# Patient Record
Sex: Male | Born: 1950
Health system: Southern US, Community
[De-identification: ages and names within clinical notes are randomized; demographics above are authoritative.]

## PROBLEM LIST (undated history)

## (undated) DIAGNOSIS — E119 Type 2 diabetes mellitus without complications: Secondary | ICD-10-CM

## (undated) DIAGNOSIS — F329 Major depressive disorder, single episode, unspecified: Secondary | ICD-10-CM

## (undated) DIAGNOSIS — M549 Dorsalgia, unspecified: Secondary | ICD-10-CM

## (undated) DIAGNOSIS — N200 Calculus of kidney: Secondary | ICD-10-CM

## (undated) DIAGNOSIS — E079 Disorder of thyroid, unspecified: Secondary | ICD-10-CM

## (undated) DIAGNOSIS — E039 Hypothyroidism, unspecified: Secondary | ICD-10-CM

## (undated) DIAGNOSIS — F419 Anxiety disorder, unspecified: Secondary | ICD-10-CM

## (undated) DIAGNOSIS — F32A Depression, unspecified: Secondary | ICD-10-CM

## (undated) DIAGNOSIS — M1990 Unspecified osteoarthritis, unspecified site: Secondary | ICD-10-CM

## (undated) DIAGNOSIS — Z87442 Personal history of urinary calculi: Secondary | ICD-10-CM

## (undated) DIAGNOSIS — G8929 Other chronic pain: Secondary | ICD-10-CM

## (undated) DIAGNOSIS — G4730 Sleep apnea, unspecified: Secondary | ICD-10-CM

## (undated) HISTORY — PX: CERVICAL FUSION: SHX112

## (undated) HISTORY — PX: KNEE SURGERY: SHX244

## (undated) HISTORY — PX: BACK SURGERY: SHX140

---

## 1998-08-25 ENCOUNTER — Other Ambulatory Visit: Admission: RE | Admit: 1998-08-25 | Discharge: 1998-08-25 | Payer: Self-pay | Admitting: Urology

## 2004-03-20 ENCOUNTER — Encounter: Admission: RE | Admit: 2004-03-20 | Discharge: 2004-04-13 | Payer: Self-pay | Admitting: *Deleted

## 2005-08-25 ENCOUNTER — Encounter: Admission: RE | Admit: 2005-08-25 | Discharge: 2005-10-10 | Payer: Self-pay | Admitting: Family Medicine

## 2005-10-18 ENCOUNTER — Ambulatory Visit (HOSPITAL_COMMUNITY): Admission: RE | Admit: 2005-10-18 | Discharge: 2005-10-19 | Payer: Self-pay | Admitting: Neurosurgery

## 2006-01-26 ENCOUNTER — Encounter: Admission: RE | Admit: 2006-01-26 | Discharge: 2006-03-30 | Payer: Self-pay | Admitting: *Deleted

## 2006-03-17 ENCOUNTER — Ambulatory Visit (HOSPITAL_COMMUNITY): Admission: RE | Admit: 2006-03-17 | Discharge: 2006-03-17 | Payer: Self-pay | Admitting: Gastroenterology

## 2007-07-17 ENCOUNTER — Encounter: Admission: RE | Admit: 2007-07-17 | Discharge: 2007-07-17 | Payer: Self-pay | Admitting: *Deleted

## 2008-11-01 ENCOUNTER — Ambulatory Visit (HOSPITAL_COMMUNITY): Admission: RE | Admit: 2008-11-01 | Discharge: 2008-11-01 | Payer: Self-pay | Admitting: Otolaryngology

## 2009-01-28 ENCOUNTER — Ambulatory Visit (HOSPITAL_BASED_OUTPATIENT_CLINIC_OR_DEPARTMENT_OTHER): Admission: RE | Admit: 2009-01-28 | Discharge: 2009-01-28 | Payer: Self-pay | Admitting: Otolaryngology

## 2010-06-25 ENCOUNTER — Emergency Department (HOSPITAL_COMMUNITY): Admission: EM | Admit: 2010-06-25 | Discharge: 2010-06-25 | Payer: Self-pay | Admitting: Internal Medicine

## 2010-12-13 ENCOUNTER — Encounter: Payer: Self-pay | Admitting: Neurosurgery

## 2011-02-05 LAB — URINALYSIS, ROUTINE W REFLEX MICROSCOPIC
Bilirubin Urine: NEGATIVE
Glucose, UA: 250 mg/dL — AB
Ketones, ur: 15 mg/dL — AB
Leukocytes, UA: NEGATIVE
Nitrite: NEGATIVE
Protein, ur: NEGATIVE mg/dL
Specific Gravity, Urine: 1.033 — ABNORMAL HIGH (ref 1.005–1.030)
Urobilinogen, UA: 0.2 mg/dL (ref 0.0–1.0)
pH: 5 (ref 5.0–8.0)

## 2011-02-05 LAB — DIFFERENTIAL
Basophils Absolute: 0 10*3/uL (ref 0.0–0.1)
Basophils Relative: 0 % (ref 0–1)
Eosinophils Absolute: 0.1 10*3/uL (ref 0.0–0.7)
Eosinophils Relative: 1 % (ref 0–5)
Lymphocytes Relative: 14 % (ref 12–46)
Lymphs Abs: 1.7 10*3/uL (ref 0.7–4.0)
Monocytes Absolute: 0.6 10*3/uL (ref 0.1–1.0)
Monocytes Relative: 5 % (ref 3–12)
Neutro Abs: 10 10*3/uL — ABNORMAL HIGH (ref 1.7–7.7)
Neutrophils Relative %: 80 % — ABNORMAL HIGH (ref 43–77)

## 2011-02-05 LAB — CBC
HCT: 44.5 % (ref 39.0–52.0)
Hemoglobin: 15.3 g/dL (ref 13.0–17.0)
MCH: 33.4 pg (ref 26.0–34.0)
MCHC: 34.4 g/dL (ref 30.0–36.0)
MCV: 97 fL (ref 78.0–100.0)
Platelets: 168 10*3/uL (ref 150–400)
RBC: 4.59 MIL/uL (ref 4.22–5.81)
RDW: 13.8 % (ref 11.5–15.5)
WBC: 12.5 10*3/uL — ABNORMAL HIGH (ref 4.0–10.5)

## 2011-02-05 LAB — BASIC METABOLIC PANEL
BUN: 24 mg/dL — ABNORMAL HIGH (ref 6–23)
CO2: 25 mEq/L (ref 19–32)
Calcium: 10 mg/dL (ref 8.4–10.5)
Chloride: 104 mEq/L (ref 96–112)
Creatinine, Ser: 1.2 mg/dL (ref 0.4–1.5)
GFR calc Af Amer: >60 mL/min (ref >60)
GFR calc non Af Amer: >60 mL/min (ref >60)
Glucose, Bld: 233 mg/dL — ABNORMAL HIGH (ref 70–99)
Potassium: 4.4 mEq/L (ref 3.5–5.1)
Sodium: 140 mEq/L (ref 135–145)

## 2011-02-05 LAB — URINE MICROSCOPIC-ADD ON

## 2011-02-17 ENCOUNTER — Emergency Department (HOSPITAL_COMMUNITY): Admission: EM | Admit: 2011-02-17 | Payer: Self-pay | Source: Home / Self Care

## 2011-04-06 NOTE — Procedures (Signed)
NAME:  Trevor Grimes, Trevor Grimes              ACCOUNT NO.:  [id number]   MEDICAL RECORD NO.:  [id number]          PATIENT TYPE:  OUT   LOCATION:  SLEEP CENTER                 FACILITY:  [facility]   PHYSICIAN:  Clinton D. Maple Hudson, MD, FCCP, FACPDATE OF BIRTH:  07[birth year]   DATE OF STUDY:  01/28/2009                            NOCTURNAL POLYSOMNOGRAM   REFERRING PHYSICIAN:  Onalee Hua L. Annalee Genta, M.D.   INDICATION FOR STUDY:  Hypersomnia with sleep apnea.   EPWORTH SLEEPINESS SCORE:  Epworth sleepiness score 4/24.  BMI 31.6.  Weight 220 pounds, height 70 inches.  Neck 16 inches.   MEDICATIONS:  Home medication charted and reviewed.   SLEEP ARCHITECTURE:  Split study protocol.  During the diagnostic phase,  total sleep time was 139 minutes with sleep efficiency 78.1%.  Stage I  was 9.4%, stage II 90.6%.  Stages III and REM were absent.  Sleep  latency 16 minutes.  Awake after sleep onset 23 minutes.  Arousal index  21.2.  No bedtime medication was taken.   RESPIRATORY DATA:  Split study protocol.  Apnea-hypopnea index (AHI) 41  per hour.  A total of 95 events was scored including 3 obstructive  apneas, 6 central apneas, and 86 hypopneas.  Events were not positional.  CPAP was then titrated to 12 CWP, AHI 3.3 per hour.  He used his own  mask, a medium ResMed Mirage Micro mask with heated humidifier.   OXYGEN DATA:  Moderate snoring with oxygen desaturation to a nadir of  85%.  After CPAP control, mean oxygen saturation held 94.1% on room air.   CARDIAC DATA:  Normal sinus rhythm.   MOVEMENT-PARASOMNIA:  A total of 28 limb jerks were counted, but only  one was associated with arousal or awakening, insignificant.  Bathroom  x1.   IMPRESSIONS-RECOMMENDATIONS:  1. Moderately severe obstructive sleep apnea/hypopnea syndrome, AHI 41      per hour with nonpositional events, moderate snoring, and oxygen      desaturation to a nadir of 85%.  2. Successful CPAP titration to 12 CWP, AHI 3.3 per hour.  He  used his      own mask, a medium ResMed Mirage Micro mask with heated humidifier.      Clinton D. Maple Hudson, MD, [facility], FACP  Diplomate, Biomedical engineer of Sleep Medicine  Electronically Signed     CDY/MEDQ  D:  01/31/2009 21:10:27  T:  02/01/2009 06:38:34  Job:  [id number]

## 2011-04-06 NOTE — Op Note (Signed)
Trevor Grimes, Trevor Grimes              ACCOUNT NO.:  [id number]   MEDICAL RECORD NO.:  [id number]          PATIENT TYPE:  AMB   LOCATION:  SDS                          FACILITY:  [facility]   PHYSICIAN:  Kinnie Scales. Annalee Genta, M.D.DATE OF BIRTH:  04-29-1951   DATE OF PROCEDURE:  11/01/2008  DATE OF DISCHARGE:  11/01/2008                               OPERATIVE REPORT   PREOPERATIVE DIAGNOSES:  1. Nasal septal deviation with airway obstruction.  2. Bilateral inferior turbinate hypertrophy.  3. Obstructive sleep apnea.   POSTOPERATIVE DIAGNOSES:  1. Nasal septal deviation with airway obstruction.  2. Bilateral inferior turbinate hypertrophy.  3. Obstructive sleep apnea.   INDICATIONS FOR SURGERY:  1. Nasal septal deviation with airway obstruction.  2. Bilateral inferior turbinate hypertrophy.  3. Obstructive sleep apnea.   SURGICAL PROCEDURES:  1. Nasal septoplasty.  2. Bilateral inferior turbinate reduction.   SURGEON:  Kinnie Scales. Annalee Genta, MD   COMPLICATIONS:  None.   ESTIMATED BLOOD LOSS:  Less than 50 mL.   ANESTHESIA:  General endotracheal.   The patient was transferred from  the operating room to recovery room in  stable condition.   BRIEF HISTORY:  The patient is a 60 year old white male who is referred  to our office for evaluation of nasal airway obstruction.  The patient  has mild obstructive sleep apnea and attempted to wear CPAP on a nightly  basis, but because of his nasal airway obstruction he was unable to  adequately tolerate the device.  Examination in the office revealed a  severely deviated nasal septum and significant inferior turbinate  hypertrophy resulting in nasal airway obstruction.  Given his history  and examination, I recommended that we undertake nasal septoplasty and  turbinate reduction under general anesthesia as an outpatient at [facility] OR.  The risk, benefits, and possible complications  of the procedure were discussed in detail  with the patient and his wife  and they understood and concurred  to our plan for surgery which is  scheduled as an outpatient under general anesthesia on November 01, 2008.   PROCEDURE:  The patient was brought to the operating room at [facility] OR and placed in the supine position on the operating  table.  General endotracheal anesthesia was established without  difficulty.  When the patient was adequately anesthetized, his nose  injected with a total of 8 mL of 1% lidocaine and 1:100,000 solution of  epinephrine, which was injected in a submucosal fashion on the nasal  septum and inferior turbinates bilaterally.  The patient's nose was then  packed with Afrin-soaked cottonoid pledgets, which were placed  approximately 10 minutes.  The patient was then positioned on the  operating table and prepped and draped in the sterile fashion.   Procedure was begun by creating a right anterior hemi-transfixion  incision and mucoperichondrial flap was elevated from anterior posterior  on the patient's right-hand side.  Bony cartilaginous junction was  crossed in the midline and mucoperiosteal flap was elevated on the left  with the septal cartilage and bone exposed.  The midseptal  cartilage was  resected.  At the conclusion of the procedure, this was morselized and  returned to the mucoperichondrial pocket.  Deviated bone and cartilage  in the mid and posterior aspects of the septum were then resected  including a large inferior septal spur.  Overlying septal mucosa was  preserved.  The resected cartilage was morselized and returned to the  mucoperichondrial pocket.  The mucosal flaps were reapproximated with a  4-0 gut suture on a Keith needle in a horizontal mattress fashion.  The  anterior septal incision was closed with same stitch.  At the conclusion  of the procedure, bilateral Doyle nasal septal splints were placed after  the application of Bactroban ointment and sutured  in position with 3-0  Ethilon suture.   Inferior turbinate reduction was then performed with the cautery set at  12 watts.  Two submucosal passes were made in each inferior turbinate.  When the turbinates had been adequately cauterized, they were  outfractured.  Small anterior incisions were created in each turbinate.  Overlying mucosa was elevated and a small amount of turbinate bone was  then resected.  Nasal cavity was widely patent.  The patient's nasal  cavity and nasopharynx were irrigated and suctioned and orogastric tube  was passed.  Stomach contents were aspirated.  The patient was awakened  from his anesthetic, extubated, and transferred from the operating room  to the recovery room in stable condition.  No complications.  Blood loss  less than 50 mL.           ______________________________  Kinnie Scales. Annalee Genta, M.D.     DLS/MEDQ  D:  16/08/9603  T:  11/01/2008  Job:  [id number]

## 2011-04-09 NOTE — Op Note (Signed)
[name], Grimes              ACCOUNT NO.:  [id number]   MEDICAL RECORD NO.:  [id number]          PATIENT TYPE:  AMB   LOCATION:  ENDO                         FACILITY:  [facility]   PHYSICIAN:  Petra Kuba, M.D.    DATE OF BIRTH:  06/19/1951   DATE OF PROCEDURE:  03/17/2006  DATE OF DISCHARGE:                                 OPERATIVE REPORT   PROCEDURE:  Colonoscopy.   INDICATIONS:  Screening.   Consent was signed after risks, benefits, methods, and options thoroughly  discussed in the office.   MEDICINES USED:  Diprivan 80 mg, Versed 2, given by anesthesia.   PROCEDURE:  Rectal inspection is pertinent for external hemorrhoids.  Digital exam was negative.  The video colonoscope was inserted, easily  advanced around the colon to the cecum.  This did not require any abdominal  pressure or any position changes other than a rare early left-sided  diverticula.  No abnormalities were seen on insertion.  The cecum was  identified by the appendiceal orifice and the ileocecal valve.  The scope  was slowly withdrawn.  The prep was adequate.  There was minimal liquid  stool that required washing and suctioning.  On slow withdrawal through the  colon, the cecum ascending, transverse, and majority of the descending were  normal.  There was the rare early occasional diverticula seen in the left  side of the colon.  Once back in the rectum, anorectal pull-through and  retroflexion confirmed some small hemorrhoids.  The scope was straightened  and readvanced shortways up the left side of the colon.  Air was suctioned.  The scope removed.  The patient tolerated the procedure well.  There was no  obvious immediate complication.   ENDOSCOPIC DIAGNOSES:  1.  Internal/external small hemorrhoids.  2.  Rare early left-sided diverticula.  3.  Otherwise within normal limits to the cecum.   PLAN:  Return to the care of Dr. Abigail Miyamoto for the customary health-care  maintenance.  I will be happy to see  back p.r.n., otherwise repeat colon  screening in 5-10 years.           ______________________________  Petra Kuba, M.D.     MEM/MEDQ  D:  03/17/2006  T:  03/17/2006  Job:  [id number]   cc:   Chales Salmon. Abigail Miyamoto, M.D.  Fax: [phone]

## 2011-04-09 NOTE — Op Note (Signed)
Trevor Grimes, Trevor Grimes              ACCOUNT NO.:  [id number]   MEDICAL RECORD NO.:  [id number]          PATIENT TYPE:  OIB   LOCATION:  3027                         FACILITY:  [facility]   PHYSICIAN:  Hewitt Shorts, M.D.DATE OF BIRTH:  03-Apr-1951   DATE OF PROCEDURE:  10/18/2005  DATE OF DISCHARGE:                                 OPERATIVE REPORT   PREOPERATIVE DIAGNOSIS:  Left L4-5 lumbar disk herniation, lumbar  degenerative disk disease, lumbar stenosis and lumbar radiculopathy.   POSTOPERATIVE DIAGNOSIS:  Left L4-5 lumbar disk herniation, lumbar  degenerative disk disease, lumbar stenosis and lumbar radiculopathy.   PROCEDURE:  Left L4-5 lumbar laminotomy and microdiskectomy with  microdissection.   SURGEON:  Hewitt Shorts, M.D.   ASSISTANT:  Clydene Fake, M.D.   ANESTHESIA:  General endotracheal.   INDICATIONS:  The patient is a 60 year old man who presented with a left  lumbar radiculopathy secondary to a left L4-5 lumbar disk herniation with a  fragment that had migrated caudally behind the body of L5, compressing the  exiting left L5 nerve root.  A decision was made to proceed with elective  lumbar laminotomy and microdiskectomy.  It should be noted that the patient  has a sixth lumbar-type vertebra.  The lowest is identified as a lumbarized  S1 vertebra, and the disk herniation is at the third disk segment above the  fused portion of the sacrum.   PROCEDURE:  The patient was brought to the operating room and placed under  general endotracheal anesthesia.  The patient was turned to a prone position  and the lumbar region was prepped with Betadine soap and solution and draped  in a sterile fashion.  The midline was infiltrated with local anesthetic  with epinephrine.  A localizing x-ray was taken, the L4-5 level identified,  and a midline incision made over the L4-5 level and carried down through the  subcutaneous tissue with bipolar cautery and electrocautery  used to maintain  hemostasis.  Dissection was carried down to the lumbar fascia, which was  incised on the left side of the midline in the paraspinal muscles, with  dissection of the spinous process and lamina in subperiosteal fashion.  The  L4-5 interlaminar space was identified and another x-ray was taken to  confirm the localization, and then the microscope was draped and brought  into the field to provide additional magnification, illumination and  visualization and the remainder of the decompression performed using  microdissection and microsurgical technique.  A laminotomy was performed  using the X-Max drill and Kerrison punches.  The ligamentum flavum was  carefully dissected.  Then we performed a foraminotomy for the exiting left  L5 nerve root.  We then gently retracted the thecal sac and left L5 nerve  root medially, exposing the disk herniation.  There was an extruded fragment  caudally behind the body of L5.  This was freed up from the surrounding  epidural tissues and removed.  We then incised the annulus and entered the  disk space and proceeded with a thorough diskectomy.  We removed extensive  amounts of degenerated disk  material.  We then further examined the neural  foramen.  Another fragment was found.  This was a large fragment.  With  that, the thecal sac and nerve root were well-decompressed.  We examined the  epidural space.  No further fragments were found.  We completed the  diskectomy within the disk space and in the end, all loose fragments of disk  material were removed from the disk space and the epidural space, and good  decompression of the thecal sac and nerve root was achieved.  Spondylitic  overgrowth at the posterior aspect of L4 and L5 was removed, and then  hemostasis was established with the use of both bipolar cautery as well as  Gelfoam soaked in thrombin; however, all the Gelfoam was removed prior to  closure.  Once hemostasis was established, the  wound was irrigated with  bacitracin solution, as had been done several times throughout the  procedure, and then we instilled 2 mL of fentanyl and 80 mg of Depo-Medrol  into the epidural space and then proceeded with closure.  The deep fascia  closed with interrupted, undyed 1 Vicryl suture, the subcutaneous and  subcuticular layer were closed with interrupted, inverted 2-0 undyed Vicryl  sutures, and the skin was reapproximated with Dermabond.  The procedure was  tolerated well.  The estimated blood loss was 100 mL.  Sponge and needle  count were correct.  Following surgery the patient was turned back to the  supine position, to be reversed from the anesthetic, extubated and  transferred to the recovery room for further care.      Hewitt Shorts, M.D.  Electronically Signed     RWN/MEDQ  D:  10/18/2005  T:  10/19/2005  Job:  [id number]

## 2011-07-19 ENCOUNTER — Ambulatory Visit (HOSPITAL_BASED_OUTPATIENT_CLINIC_OR_DEPARTMENT_OTHER)
Admission: RE | Admit: 2011-07-19 | Discharge: 2011-07-19 | Disposition: A | Discharge status: Home / Self Care | Payer: BC Managed Care – PPO | Source: Ambulatory Visit | Attending: Orthopedic Surgery | Admitting: Orthopedic Surgery

## 2011-07-19 DIAGNOSIS — M23359 Other meniscus derangements, posterior horn of lateral meniscus, unspecified knee: Secondary | ICD-10-CM | POA: Insufficient documentation

## 2011-07-19 DIAGNOSIS — M2240 Chondromalacia patellae, unspecified knee: Secondary | ICD-10-CM | POA: Insufficient documentation

## 2011-07-19 LAB — POCT I-STAT, CHEM 8
Calcium, Ion: 1.16 mmol/L (ref 1.12–1.32)
Chloride: 108 mEq/L (ref 96–112)
Creatinine, Ser: 0.8 mg/dL (ref 0.50–1.35)
HCT: 44 % (ref 39.0–52.0)
Hemoglobin: 15 g/dL (ref 13.0–17.0)
Potassium: 4.7 mEq/L (ref 3.5–5.1)
Sodium: 139 mEq/L (ref 135–145)
TCO2: 23 mmol/L (ref 0–100)

## 2011-07-20 LAB — GLUCOSE, CAPILLARY: Glucose-Capillary: 147 mg/dL — ABNORMAL HIGH (ref 70–99)

## 2011-07-27 NOTE — Op Note (Signed)
  NAMEQUINTON, Trevor Grimes              ACCOUNT NO.:  [id number]  MEDICAL RECORD NO.:  [id number]  LOCATION:                                 FACILITY:  PHYSICIAN:  Elana Alm. Thurston Hole, M.D. 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  PROCEDURE:  Right knee EUA followed by arthroscopic partial lateral meniscectomy with chondroplasty.  SURGEON:  Elana Alm. Thurston Hole, MD  ANESTHESIA:  General.  OPERATIVE TIME:  30 minutes.  COMPLICATIONS:  None.  INDICATIONS FOR PROCEDURE:  [name] is a 60 year old gentleman who has had significant increasing right knee pain for the past 6 months with exam and MRI documenting lateral meniscus tear with chondromalacia. He has failed conservative care and is now to undergo arthroscopy.  DESCRIPTION:  Mr. [name] was brought to the operating room on July 19, 2011, after knee block was placed in holding area by Anesthesia.  He was placed on operative table in supine position.  He received Ancef 2 grams IV preoperatively for prophylaxis.  After being placed under general anesthesia, his right knee was examined.  He had full range of motion. Knee was stable ligamentous exam with normal patellar tracking.  The right leg was prepped using sterile DuraPrep and draped using sterile technique.  Time-out procedure was called and the correct right knee identified.  Initially through an anterolateral portal, the arthroscope where the pump attached was placed and through an anteromedial portal, an arthroscopic probe was placed.  On initial inspection of medial compartment, he had  25% grade 3 chondromalacia in medial femoral condyle, which was debrided, medial meniscus was intact.  Intercondylar notch inspected and  anteroposterior cruciate ligaments were normal. Lateral compartment inspected.  He had 30% grade 4 and 50-60% grade 3 chondromalacia, which was debrided.  Lateral meniscus showed complex tearing of the posterolateral and anterior horn of which 50% of posterior horn and 75% of lateral horn and 25% of the anterior horn were resected back to stable rim.  Patellofemoral joint, articular cartilage was intact.  The patella tracked normally.  Medial and lateral gutters were free of pathology.  After this was done, it is felt that all pathology have been satisfactorily addressed.  The instruments were removed.  Portals were closed with 3-0 nylon suture.  Sterile dressings were applied and the patient awakened and taken to recovery room in stable condition.  FOLLOWUP CARE:  [name] will to be followed as an outpatient on Percocet for pain.  He will be seen back in office in a week for sutures out and followup.     [name] A. Thurston Hole, M.D.     RAW/MEDQ  D:  07/19/2011  T:  07/19/2011  Job:  [id number]  Electronically Signed by Salvatore Marvel M.D. on 07/27/2011 08:27:46 AM

## 2011-08-27 LAB — GLUCOSE, CAPILLARY: Glucose-Capillary: 166 mg/dL — ABNORMAL HIGH (ref 70–99)

## 2011-08-27 LAB — CBC
HCT: 45.2 % (ref 39.0–52.0)
Hemoglobin: 15.2 g/dL (ref 13.0–17.0)
MCHC: 33.6 g/dL (ref 30.0–36.0)
MCV: 98.3 fL (ref 78.0–100.0)
Platelets: 168 10*3/uL (ref 150–400)
RBC: 4.59 MIL/uL (ref 4.22–5.81)
RDW: 13.4 % (ref 11.5–15.5)
WBC: 5.8 10*3/uL (ref 4.0–10.5)

## 2011-08-27 LAB — BASIC METABOLIC PANEL
BUN: 24 mg/dL — ABNORMAL HIGH (ref 6–23)
CO2: 29 mEq/L (ref 19–32)
Calcium: 9.8 mg/dL (ref 8.4–10.5)
Chloride: 103 mEq/L (ref 96–112)
Creatinine, Ser: 0.89 mg/dL (ref 0.4–1.5)
GFR calc Af Amer: >60 mL/min (ref >60)
GFR calc non Af Amer: >60 mL/min (ref >60)
Glucose, Bld: 185 mg/dL — ABNORMAL HIGH (ref 70–99)
Potassium: 4.7 mEq/L (ref 3.5–5.1)
Sodium: 139 mEq/L (ref 135–145)

## 2011-12-01 ENCOUNTER — Ambulatory Visit (INDEPENDENT_AMBULATORY_CARE_PROVIDER_SITE_OTHER): Payer: BC Managed Care – PPO

## 2011-12-01 DIAGNOSIS — J209 Acute bronchitis, unspecified: Secondary | ICD-10-CM

## 2012-11-08 ENCOUNTER — Other Ambulatory Visit (HOSPITAL_COMMUNITY): Payer: Self-pay | Admitting: Podiatry

## 2012-11-08 DIAGNOSIS — IMO0002 Reserved for concepts with insufficient information to code with codable children: Secondary | ICD-10-CM

## 2012-11-13 ENCOUNTER — Encounter (HOSPITAL_COMMUNITY)
Admission: RE | Admit: 2012-11-13 | Discharge: 2012-11-13 | Disposition: A | Discharge status: Home / Self Care | Payer: BC Managed Care – PPO | Source: Ambulatory Visit | Attending: Podiatry | Admitting: Podiatry

## 2012-11-13 DIAGNOSIS — R937 Abnormal findings on diagnostic imaging of other parts of musculoskeletal system: Secondary | ICD-10-CM | POA: Insufficient documentation

## 2012-11-13 DIAGNOSIS — M79609 Pain in unspecified limb: Secondary | ICD-10-CM | POA: Insufficient documentation

## 2012-11-13 DIAGNOSIS — M7989 Other specified soft tissue disorders: Secondary | ICD-10-CM | POA: Insufficient documentation

## 2012-11-13 DIAGNOSIS — IMO0002 Reserved for concepts with insufficient information to code with codable children: Secondary | ICD-10-CM

## 2012-11-13 MED ORDER — TECHNETIUM TC 99M MEDRONATE IV KIT
27.0000 | PACK | Freq: Once | INTRAVENOUS | Status: AC | PRN
  Administered 2012-11-13: 27 via INTRAVENOUS

## 2013-01-23 ENCOUNTER — Encounter (HOSPITAL_COMMUNITY): Payer: Self-pay | Admitting: Pharmacy Technician

## 2013-01-24 ENCOUNTER — Encounter (HOSPITAL_COMMUNITY): Payer: Self-pay

## 2013-01-24 ENCOUNTER — Other Ambulatory Visit: Payer: Self-pay | Admitting: Orthopedic Surgery

## 2013-01-25 ENCOUNTER — Encounter (HOSPITAL_COMMUNITY): Payer: Self-pay | Admitting: Anesthesiology

## 2013-01-25 ENCOUNTER — Encounter (HOSPITAL_COMMUNITY): Payer: Self-pay | Admitting: *Deleted

## 2013-01-25 ENCOUNTER — Encounter (HOSPITAL_COMMUNITY)
Admission: RE | Disposition: A | Discharge status: Home Health | Payer: Self-pay | Source: Ambulatory Visit | Attending: Orthopedic Surgery

## 2013-01-25 ENCOUNTER — Ambulatory Visit (HOSPITAL_COMMUNITY): Payer: BC Managed Care – PPO | Admitting: Anesthesiology

## 2013-01-25 DIAGNOSIS — F411 Generalized anxiety disorder: Secondary | ICD-10-CM | POA: Diagnosis present

## 2013-01-25 DIAGNOSIS — M109 Gout, unspecified: Secondary | ICD-10-CM | POA: Diagnosis present

## 2013-01-25 DIAGNOSIS — E079 Disorder of thyroid, unspecified: Secondary | ICD-10-CM | POA: Diagnosis present

## 2013-01-25 DIAGNOSIS — E1169 Type 2 diabetes mellitus with other specified complication: Principal | ICD-10-CM | POA: Diagnosis present

## 2013-01-25 DIAGNOSIS — G8929 Other chronic pain: Secondary | ICD-10-CM | POA: Diagnosis present

## 2013-01-25 DIAGNOSIS — M549 Dorsalgia, unspecified: Secondary | ICD-10-CM | POA: Diagnosis present

## 2013-01-25 DIAGNOSIS — Z79899 Other long term (current) drug therapy: Secondary | ICD-10-CM

## 2013-01-25 DIAGNOSIS — M869 Osteomyelitis, unspecified: Secondary | ICD-10-CM | POA: Diagnosis present

## 2013-01-25 DIAGNOSIS — Z87442 Personal history of urinary calculi: Secondary | ICD-10-CM

## 2013-01-25 DIAGNOSIS — M129 Arthropathy, unspecified: Secondary | ICD-10-CM | POA: Diagnosis present

## 2013-01-25 DIAGNOSIS — F329 Major depressive disorder, single episode, unspecified: Secondary | ICD-10-CM | POA: Diagnosis present

## 2013-01-25 DIAGNOSIS — F3289 Other specified depressive episodes: Secondary | ICD-10-CM | POA: Diagnosis present

## 2013-01-25 DIAGNOSIS — A4901 Methicillin susceptible Staphylococcus aureus infection, unspecified site: Secondary | ICD-10-CM | POA: Diagnosis present

## 2013-01-25 DIAGNOSIS — M009 Pyogenic arthritis, unspecified: Secondary | ICD-10-CM | POA: Diagnosis present

## 2013-01-25 DIAGNOSIS — M9080 Osteopathy in diseases classified elsewhere, unspecified site: Secondary | ICD-10-CM | POA: Diagnosis present

## 2013-01-25 HISTORY — PX: AMPUTATION: SHX166

## 2013-01-25 HISTORY — DX: Unspecified osteoarthritis, unspecified site: M19.90

## 2013-01-25 HISTORY — DX: Depression, unspecified: F32.A

## 2013-01-25 HISTORY — DX: Type 2 diabetes mellitus without complications: E11.9

## 2013-01-25 HISTORY — DX: Anxiety disorder, unspecified: F41.9

## 2013-01-25 HISTORY — DX: Other chronic pain: G89.29

## 2013-01-25 HISTORY — DX: Disorder of thyroid, unspecified: E07.9

## 2013-01-25 HISTORY — DX: Dorsalgia, unspecified: M54.9

## 2013-01-25 HISTORY — PX: I&D EXTREMITY: SHX5045

## 2013-01-25 HISTORY — DX: Calculus of kidney: N20.0

## 2013-01-25 HISTORY — DX: Major depressive disorder, single episode, unspecified: F32.9

## 2013-01-25 LAB — BASIC METABOLIC PANEL
BUN: 19 mg/dL (ref 6–23)
Calcium: 9.6 mg/dL (ref 8.4–10.5)
Creatinine, Ser: 0.91 mg/dL (ref 0.50–1.35)
GFR calc non Af Amer: 90 mL/min — ABNORMAL LOW (ref >90)
Glucose, Bld: 152 mg/dL — ABNORMAL HIGH (ref 70–99)
Sodium: 138 mEq/L (ref 135–145)

## 2013-01-25 LAB — CBC
HCT: 39.2 % (ref 39.0–52.0)
Hemoglobin: 13.5 g/dL (ref 13.0–17.0)
MCH: 31.5 pg (ref 26.0–34.0)
MCHC: 34.4 g/dL (ref 30.0–36.0)
MCV: 91.4 fL (ref 78.0–100.0)

## 2013-01-25 LAB — GLUCOSE, CAPILLARY
Glucose-Capillary: 179 mg/dL — ABNORMAL HIGH (ref 70–99)
Glucose-Capillary: 150 mg/dL — ABNORMAL HIGH (ref 70–99)

## 2013-01-25 SURGERY — AMPUTATION, FOOT, RAY
Anesthesia: General | Site: Toe | Laterality: Right | Wound class: Dirty or Infected

## 2013-01-25 MED ORDER — FENTANYL CITRATE 0.05 MG/ML IJ SOLN
INTRAMUSCULAR | Status: DC | PRN
  Administered 2013-01-25: 150 ug via INTRAVENOUS

## 2013-01-25 MED ORDER — PIPERACILLIN-TAZOBACTAM 3.375 G IVPB
3.3750 g | Freq: Three times a day (TID) | INTRAVENOUS | Status: DC
  Administered 2013-01-25 – 2013-01-27 (×5): 3.375 g via INTRAVENOUS
  Filled 2013-01-25 (×7): qty 50

## 2013-01-25 MED ORDER — FENTANYL CITRATE 0.05 MG/ML IJ SOLN
50.0000 ug | INTRAMUSCULAR | Status: DC | PRN
  Administered 2013-01-25: 100 ug via INTRAVENOUS

## 2013-01-25 MED ORDER — BACITRACIN ZINC 500 UNIT/GM EX OINT
TOPICAL_OINTMENT | CUTANEOUS | Status: AC
  Filled 2013-01-25: qty 15

## 2013-01-25 MED ORDER — PROMETHAZINE HCL 25 MG/ML IJ SOLN: 6.2500 mg | INTRAMUSCULAR | Status: DC | PRN

## 2013-01-25 MED ORDER — ACETAMINOPHEN 325 MG PO TABS: 650.0000 mg | ORAL_TABLET | Freq: Four times a day (QID) | ORAL | Status: DC | PRN

## 2013-01-25 MED ORDER — CHLORHEXIDINE GLUCONATE 4 % EX LIQD: 60.0000 mL | Freq: Once | CUTANEOUS | Status: DC

## 2013-01-25 MED ORDER — VANCOMYCIN HCL IN DEXTROSE 1-5 GM/200ML-% IV SOLN
1000.0000 mg | Freq: Once | INTRAVENOUS | Status: DC
  Filled 2013-01-25: qty 200

## 2013-01-25 MED ORDER — GLYBURIDE 2.5 MG PO TABS
2.5000 mg | ORAL_TABLET | Freq: Two times a day (BID) | ORAL | Status: DC
  Administered 2013-01-26 – 2013-01-29 (×7): 2.5 mg via ORAL
  Filled 2013-01-25 (×9): qty 1

## 2013-01-25 MED ORDER — INSULIN ASPART 100 UNIT/ML ~~LOC~~ SOLN: 0.0000 [IU] | Freq: Three times a day (TID) | SUBCUTANEOUS | Status: DC

## 2013-01-25 MED ORDER — VANCOMYCIN HCL 1000 MG IV SOLR
1000.0000 mg | INTRAVENOUS | Status: DC | PRN
  Administered 2013-01-25: 1000 mg via INTRAVENOUS

## 2013-01-25 MED ORDER — MIDAZOLAM HCL 2 MG/2ML IJ SOLN
INTRAMUSCULAR | Status: AC
  Filled 2013-01-25: qty 2

## 2013-01-25 MED ORDER — BACITRACIN ZINC 500 UNIT/GM EX OINT
TOPICAL_OINTMENT | CUTANEOUS | Status: DC | PRN
  Administered 2013-01-25: 1 via TOPICAL

## 2013-01-25 MED ORDER — HYDROMORPHONE HCL PF 1 MG/ML IJ SOLN
0.2500 mg | INTRAMUSCULAR | Status: DC | PRN
  Administered 2013-01-25 (×3): 0.5 mg via INTRAVENOUS

## 2013-01-25 MED ORDER — 0.9 % SODIUM CHLORIDE (POUR BTL) OPTIME
TOPICAL | Status: DC | PRN
  Administered 2013-01-25: 1000 mL

## 2013-01-25 MED ORDER — BUPIVACAINE HCL (PF) 0.25 % IJ SOLN
INTRAMUSCULAR | Status: DC | PRN
  Administered 2013-01-25: 30 mL

## 2013-01-25 MED ORDER — VANCOMYCIN HCL IN DEXTROSE 1-5 GM/200ML-% IV SOLN
INTRAVENOUS | Status: AC
  Filled 2013-01-25: qty 200

## 2013-01-25 MED ORDER — PHENYLEPHRINE HCL 10 MG/ML IJ SOLN
INTRAMUSCULAR | Status: DC | PRN
  Administered 2013-01-25: 40 ug via INTRAVENOUS

## 2013-01-25 MED ORDER — SODIUM CHLORIDE 0.9 % IV SOLN
INTRAVENOUS | Status: DC
  Administered 2013-01-25: 20:00:00 via INTRAVENOUS

## 2013-01-25 MED ORDER — LEVOTHYROXINE SODIUM 25 MCG PO TABS
25.0000 ug | ORAL_TABLET | Freq: Every day | ORAL | Status: DC
  Administered 2013-01-26 – 2013-01-29 (×4): 25 ug via ORAL
  Filled 2013-01-25 (×5): qty 1

## 2013-01-25 MED ORDER — LIDOCAINE HCL (CARDIAC) 20 MG/ML IV SOLN
INTRAVENOUS | Status: DC | PRN
  Administered 2013-01-25: 60 mg via INTRAVENOUS

## 2013-01-25 MED ORDER — MIDAZOLAM HCL 2 MG/2ML IJ SOLN
1.0000 mg | INTRAMUSCULAR | Status: DC | PRN
  Administered 2013-01-25: 2 mg via INTRAVENOUS

## 2013-01-25 MED ORDER — BUPIVACAINE HCL (PF) 0.25 % IJ SOLN
INTRAMUSCULAR | Status: AC
  Filled 2013-01-25: qty 30

## 2013-01-25 MED ORDER — DOCUSATE SODIUM 100 MG PO CAPS
100.0000 mg | ORAL_CAPSULE | Freq: Two times a day (BID) | ORAL | Status: DC
  Administered 2013-01-25 – 2013-01-29 (×6): 100 mg via ORAL
  Filled 2013-01-25 (×10): qty 1

## 2013-01-25 MED ORDER — OXYCODONE HCL 5 MG PO TABS
5.0000 mg | ORAL_TABLET | ORAL | Status: DC | PRN
  Administered 2013-01-26 – 2013-01-27 (×7): 10 mg via ORAL
  Filled 2013-01-25 (×7): qty 2

## 2013-01-25 MED ORDER — ONDANSETRON HCL 4 MG/2ML IJ SOLN
INTRAMUSCULAR | Status: DC | PRN
  Administered 2013-01-25: 4 mg via INTRAVENOUS

## 2013-01-25 MED ORDER — MUPIROCIN 2 % EX OINT
TOPICAL_OINTMENT | CUTANEOUS | Status: AC
  Administered 2013-01-25: 1 via NASAL
  Filled 2013-01-25: qty 22

## 2013-01-25 MED ORDER — LACTATED RINGERS IV SOLN
INTRAVENOUS | Status: DC | PRN
  Administered 2013-01-25: 14:00:00 via INTRAVENOUS

## 2013-01-25 MED ORDER — HYDROMORPHONE HCL PF 1 MG/ML IJ SOLN
INTRAMUSCULAR | Status: AC
  Filled 2013-01-25: qty 2

## 2013-01-25 MED ORDER — DEXTROSE 5 % IV SOLN
INTRAVENOUS | Status: DC | PRN
  Administered 2013-01-25: 16:00:00 via INTRAVENOUS

## 2013-01-25 MED ORDER — ONDANSETRON HCL 4 MG/2ML IJ SOLN: 4.0000 mg | Freq: Four times a day (QID) | INTRAMUSCULAR | Status: DC | PRN

## 2013-01-25 MED ORDER — HYDROMORPHONE HCL PF 1 MG/ML IJ SOLN
0.5000 mg | INTRAMUSCULAR | Status: DC | PRN
  Administered 2013-01-25 – 2013-01-26 (×12): 1 mg via INTRAVENOUS
  Filled 2013-01-25 (×14): qty 1

## 2013-01-25 MED ORDER — SODIUM CHLORIDE 0.9 % IV SOLN: INTRAVENOUS | Status: DC

## 2013-01-25 MED ORDER — VANCOMYCIN HCL IN DEXTROSE 1-5 GM/200ML-% IV SOLN
1000.0000 mg | Freq: Three times a day (TID) | INTRAVENOUS | Status: DC
  Administered 2013-01-25 – 2013-01-28 (×8): 1000 mg via INTRAVENOUS
  Filled 2013-01-25 (×10): qty 200

## 2013-01-25 MED ORDER — PROPOFOL 10 MG/ML IV BOLUS
INTRAVENOUS | Status: DC | PRN
  Administered 2013-01-25: 200 mg via INTRAVENOUS

## 2013-01-25 MED ORDER — MIDAZOLAM HCL 5 MG/5ML IJ SOLN
INTRAMUSCULAR | Status: DC | PRN
  Administered 2013-01-25: 2 mg via INTRAVENOUS

## 2013-01-25 MED ORDER — BUPIVACAINE-EPINEPHRINE PF 0.5-1:200000 % IJ SOLN
INTRAMUSCULAR | Status: DC | PRN
  Administered 2013-01-25: 150 mg

## 2013-01-25 MED ORDER — LACTATED RINGERS IV SOLN: INTRAVENOUS | Status: DC

## 2013-01-25 MED ORDER — METFORMIN HCL 500 MG PO TABS
1000.0000 mg | ORAL_TABLET | Freq: Every day | ORAL | Status: DC
  Administered 2013-01-26 – 2013-01-29 (×4): 1000 mg via ORAL
  Filled 2013-01-25 (×5): qty 2

## 2013-01-25 MED ORDER — OXYCODONE HCL 5 MG/5ML PO SOLN: 5.0000 mg | Freq: Once | ORAL | Status: DC | PRN

## 2013-01-25 MED ORDER — ADULT MULTIVITAMIN W/MINERALS CH
1.0000 | ORAL_TABLET | Freq: Every day | ORAL | Status: DC
  Administered 2013-01-25 – 2013-01-29 (×5): 1 via ORAL
  Filled 2013-01-25 (×5): qty 1

## 2013-01-25 MED ORDER — ONDANSETRON HCL 4 MG PO TABS: 4.0000 mg | ORAL_TABLET | Freq: Four times a day (QID) | ORAL | Status: DC | PRN

## 2013-01-25 MED ORDER — SERTRALINE HCL 50 MG PO TABS
50.0000 mg | ORAL_TABLET | Freq: Every day | ORAL | Status: DC
  Administered 2013-01-25 – 2013-01-29 (×5): 50 mg via ORAL
  Filled 2013-01-25 (×5): qty 1

## 2013-01-25 MED ORDER — SENNA 8.6 MG PO TABS
2.0000 | ORAL_TABLET | Freq: Two times a day (BID) | ORAL | Status: DC
  Administered 2013-01-25 – 2013-01-27 (×4): 17.2 mg via ORAL
  Filled 2013-01-25 (×9): qty 2

## 2013-01-25 MED ORDER — FENTANYL CITRATE 0.05 MG/ML IJ SOLN
INTRAMUSCULAR | Status: AC
  Filled 2013-01-25: qty 2

## 2013-01-25 MED ORDER — OXYCODONE HCL 5 MG PO TABS: 5.0000 mg | ORAL_TABLET | Freq: Once | ORAL | Status: DC | PRN

## 2013-01-25 SURGICAL SUPPLY — 45 items
BLADE LONG MED 31X9 (MISCELLANEOUS) ×2 IMPLANT
BLADE SAW SGTL 81X20 HD (BLADE) ×2 IMPLANT
BNDG CMPR 9X4 STRL LF SNTH (GAUZE/BANDAGES/DRESSINGS) ×1
BNDG COHESIVE 4X5 TAN STRL (GAUZE/BANDAGES/DRESSINGS) ×2 IMPLANT
BNDG COHESIVE 6X5 TAN STRL LF (GAUZE/BANDAGES/DRESSINGS) ×2 IMPLANT
BNDG ESMARK 4X9 LF (GAUZE/BANDAGES/DRESSINGS) ×2 IMPLANT
CHLORAPREP W/TINT 26ML (MISCELLANEOUS) ×2 IMPLANT
CLOTH BEACON ORANGE TIMEOUT ST (SAFETY) ×2 IMPLANT
CONT SPEC STER OR (MISCELLANEOUS) ×2 IMPLANT
CUFF TOURNIQUET SINGLE 34IN LL (TOURNIQUET CUFF) IMPLANT
CUFF TOURNIQUET SINGLE 44IN (TOURNIQUET CUFF) IMPLANT
DRAPE U-SHAPE 47X51 STRL (DRAPES) ×4 IMPLANT
DRSG ADAPTIC 3X8 NADH LF (GAUZE/BANDAGES/DRESSINGS) IMPLANT
DRSG PAD ABDOMINAL 8X10 ST (GAUZE/BANDAGES/DRESSINGS) ×2 IMPLANT
ELECT REM PT RETURN 9FT ADLT (ELECTROSURGICAL) ×2
ELECTRODE REM PT RTRN 9FT ADLT (ELECTROSURGICAL) ×1 IMPLANT
GLOVE BIO SURGEON STRL SZ8 (GLOVE) ×6 IMPLANT
GLOVE BIOGEL PI IND STRL 6.5 (GLOVE) ×1 IMPLANT
GLOVE BIOGEL PI IND STRL 8 (GLOVE) ×2 IMPLANT
GLOVE BIOGEL PI INDICATOR 6.5 (GLOVE) ×1
GLOVE BIOGEL PI INDICATOR 8 (GLOVE) ×2
GOWN PREVENTION PLUS XLARGE (GOWN DISPOSABLE) ×2 IMPLANT
GOWN STRL NON-REIN LRG LVL3 (GOWN DISPOSABLE) ×2 IMPLANT
IV CATH 22GX1 FEP (IV SOLUTION) ×2 IMPLANT
KIT BASIN OR (CUSTOM PROCEDURE TRAY) ×2 IMPLANT
KIT ROOM TURNOVER OR (KITS) ×2 IMPLANT
MANIFOLD NEPTUNE II (INSTRUMENTS) ×2 IMPLANT
NS IRRIG 1000ML POUR BTL (IV SOLUTION) ×2 IMPLANT
PACK ORTHO EXTREMITY (CUSTOM PROCEDURE TRAY) ×2 IMPLANT
PAD ARMBOARD 7.5X6 YLW CONV (MISCELLANEOUS) ×4 IMPLANT
PAD CAST 4YDX4 CTTN HI CHSV (CAST SUPPLIES) ×1 IMPLANT
PADDING CAST COTTON 4X4 STRL (CAST SUPPLIES) ×2
SPONGE GAUZE 4X4 12PLY (GAUZE/BANDAGES/DRESSINGS) ×2 IMPLANT
SPONGE LAP 18X18 X RAY DECT (DISPOSABLE) ×2 IMPLANT
STAPLER VISISTAT 35W (STAPLE) IMPLANT
STOCKINETTE IMPERVIOUS LG (DRAPES) ×2 IMPLANT
SUCTION FRAZIER TIP 10 FR DISP (SUCTIONS) ×2 IMPLANT
SUT ETHILON 2 0 PSLX (SUTURE) ×2 IMPLANT
SUT PDS AB 2-0 CT1 27 (SUTURE) ×2 IMPLANT
SYR CONTROL 10ML LL (SYRINGE) ×2 IMPLANT
TOWEL OR 17X24 6PK STRL BLUE (TOWEL DISPOSABLE) ×2 IMPLANT
TOWEL OR 17X26 10 PK STRL BLUE (TOWEL DISPOSABLE) ×2 IMPLANT
TUBE CONNECTING 12X1/4 (SUCTIONS) ×2 IMPLANT
UNDERPAD 30X30 INCONTINENT (UNDERPADS AND DIAPERS) ×2 IMPLANT
WATER STERILE IRR 1000ML POUR (IV SOLUTION) ×2 IMPLANT

## 2013-01-25 NOTE — Brief Op Note (Signed)
01/25/2013  4:32 PM  PATIENT:  Trevor Grimes  62 y.o. male  PRE-OPERATIVE DIAGNOSIS:  Right 1st MT and hallux osteomyelitis      Right 1st MTPJ septic arthritis  POST-OPERATIVE DIAGNOSIS:  same  Procedure(s): Right foot 1st ray amputation  SURGEON:  Toni Arthurs, MD  ASSISTANT: n/a  ANESTHESIA:   General, regional  EBL:  minimal   TOURNIQUET:  approx 20 min with an ankle esmarch  COMPLICATIONS:  None apparent  DISPOSITION:  Extubated, awake and stable to recovery.  SPECIMENS:  Deep tissue to micro for aerobic and anaerobic culture  DICTATION ID:  [id number]

## 2013-01-25 NOTE — Anesthesia Postprocedure Evaluation (Signed)
Anesthesia Post Note  Patient: Trevor Grimes  Procedure(s) Performed: Procedure(s) (LRB): RIGHT 1ST RAY AMPUTATION  (Right) IRRIGATION AND DEBRIDEMENT Right Hallux (Right)  Anesthesia type: general  Patient location: PACU  Post pain: Pain level controlled  Post assessment: Patient's Cardiovascular Status Stable  Last Vitals:  Filed Vitals:   01/25/13 1800  BP:   Pulse: 50  Temp:   Resp: 15    Post vital signs: Reviewed and stable  Level of consciousness: sedated  Complications: No apparent anesthesia complications

## 2013-01-25 NOTE — Anesthesia Preprocedure Evaluation (Signed)
Anesthesia Evaluation    Reviewed: Allergy & Precautions, H&P , NPO status , Patient's Chart, lab work & pertinent test results  History of Anesthesia Complications Negative for: history of anesthetic complications  Airway       Dental   Pulmonary neg pulmonary ROS,          Cardiovascular negative cardio ROS      Neuro/Psych PSYCHIATRIC DISORDERS Anxiety Depression negative neurological ROS     GI/Hepatic Neg liver ROS,   Endo/Other  diabetes, Oral Hypoglycemic Agents  Renal/GU      Musculoskeletal   Abdominal   Peds  Hematology negative hematology ROS (+)   Anesthesia Other Findings   Reproductive/Obstetrics                           Anesthesia Physical Anesthesia Plan  ASA: III  Anesthesia Plan: General   Post-op Pain Management:    Induction:   Airway Management Planned: Oral ETT  Additional Equipment:   Intra-op Plan:   Post-operative Plan: Extubation in OR  Informed Consent:   Plan Discussed with: CRNA, Anesthesiologist and Surgeon  Anesthesia Plan Comments:         Anesthesia Quick Evaluation

## 2013-01-25 NOTE — Progress Notes (Signed)
ANTIBIOTIC CONSULT NOTE - INITIAL  Pharmacy Consult for Vancomycin and Zosyn Indication: Right 1st metatarsal and hallux osteomyelitis (s/p toe amputation) and septic arthritis  No Known Allergies  Patient Measurements: Height: 5\' 10"  (177.8 cm) Weight: 208 lb (94.348 kg) IBW/kg (Calculated) : 73  Vital Signs: Temp: 97.3 F (36.3 C) (03/06 1838) Temp src: Oral (03/06 1256) BP: 124/70 mmHg (03/06 1838) Pulse Rate: 56 (03/06 1838) Intake/Output from previous day:   Intake/Output from this shift:    Labs:  Recent Labs  01/25/13 1253  WBC 8.9  HGB 13.5  PLT 167  CREATININE 0.91   Estimated Creatinine Clearance: 98.3 ml/min (by C-G formula based on Cr of 0.91).    Microbiology: Recent Results (from the past 720 hour(s))  SURGICAL PCR SCREEN     Status: None   Collection Time    01/25/13  1:08 PM      Result Value Range Status   MRSA, PCR NEGATIVE  NEGATIVE Final   Staphylococcus aureus NEGATIVE  NEGATIVE Final   Comment:            The Xpert SA Assay (FDA     approved for NASAL specimens     in patients over 81 years of age),     is one component of     a comprehensive surveillance     program.  Test performance has     been validated by [employer] for patients greater     than or equal to 26 year old.     It is not intended     to diagnose infection nor to     guide or monitor treatment.    Medical History: Past Medical History  Diagnosis Date  . Thyroid disease   . Anxiety   . Depression   . Diabetes mellitus without complication   . Kidney stones     hx of  . Arthritis   . Chronic back pain     Medications:  Prescriptions prior to admission  Medication Sig Dispense Refill  . glyBURIDE (DIABETA) 2.5 MG tablet Take 2.5 mg by mouth 2 (two) times daily with a meal.      . HYDROcodone-acetaminophen (NORCO/VICODIN) 5-325 MG per tablet Take 1 tablet by mouth every 6 (six) hours as needed for pain.      Marland Kitchen HYDROcodone-homatropine (HYDROMET) 5-1.5  MG/5ML syrup Take 5 mLs by mouth every 8 (eight) hours as needed for cough.      . levothyroxine (SYNTHROID, LEVOTHROID) 25 MCG tablet Take 25 mcg by mouth daily.      . metFORMIN (GLUCOPHAGE) 1000 MG tablet Take 1,000 mg by mouth daily with breakfast.      . Multiple Vitamin (MULTIVITAMIN WITH MINERALS) TABS Take 1 tablet by mouth daily.      . sertraline (ZOLOFT) 50 MG tablet Take 50 mg by mouth daily.       Assessment: 62 yo M admitted 01/25/2013 for R 1st toe amputation 2/2 osteomyelitis.  Pt has had foot pain for the last 6 months and has been treated with outpatient antibiotics without resolve of symptoms.  SCr = 0.9 with CrCl ~ 100 ml/min.  Pt received Vancomycin 1gm IV pre-op ~ 1530 today.  Goal of Therapy:  Vancomycin trough level 15-20 mcg/ml  Plan:  Vancomycin 1gm IV Q8h - next dose due ~ 2200 tonight. Zosyn 3.375 gm IV q8h (4 hour infusion). Will follow-up renal function, culture data, and clinical progress. Check a Vancomycin trough  at steady state.  [employer], Pharm.D., BCPS Clinical Pharmacist Pager [phone] 01/25/2013 7:11 PM

## 2013-01-25 NOTE — Preoperative (Signed)
Beta Blockers   Reason not to administer Beta Blockers:Not Applicable 

## 2013-01-25 NOTE — Progress Notes (Signed)
Orthopedic Tech Progress Note Patient Details:  Trevor Grimes [birth year] [id number]  Ortho Devices Type of Ortho Device: Postop shoe/boot Ortho Device/Splint Location: right foot Ortho Device/Splint Interventions: Application   [name], [name] 01/25/2013, 8:07 PM

## 2013-01-25 NOTE — H&P (Signed)
Trevor Grimes is an 62 y.o. male.   Chief Complaint: right forefoot pain HPI: 62 y/o male with PMH of diabetes c/o R forefoot pain for the last six months.  He's been treated for gout and infection without resolution of his symptoms.  MRI with and without contrast last week shows pyarthrosis at the hallux MPJ and osteomyelitis of the proximal phalanx of the hallux and the 1st MT.  He presents now for amputation of the 1st ray.  Past Medical History  Diagnosis Date  . Thyroid disease   . Anxiety   . Depression   . Diabetes mellitus without complication   . Kidney stones     hx of  . Arthritis   . Chronic back pain     Past Surgical History  Procedure Laterality Date  . Back surgery    . Knee surgery      History reviewed. No pertinent family history. Social History:  reports that he has never smoked. He does not have any smokeless tobacco history on file. He reports that  drinks alcohol. He reports that he does not use illicit drugs.  Allergies: No Known Allergies  Medications Prior to Admission  Medication Sig Dispense Refill  . glyBURIDE (DIABETA) 2.5 MG tablet Take 2.5 mg by mouth 2 (two) times daily with a meal.      . HYDROcodone-acetaminophen (NORCO/VICODIN) 5-325 MG per tablet Take 1 tablet by mouth every 6 (six) hours as needed for pain.      Marland Kitchen HYDROcodone-homatropine (HYDROMET) 5-1.5 MG/5ML syrup Take 5 mLs by mouth every 8 (eight) hours as needed for cough.      . levothyroxine (SYNTHROID, LEVOTHROID) 25 MCG tablet Take 25 mcg by mouth daily.      . metFORMIN (GLUCOPHAGE) 1000 MG tablet Take 1,000 mg by mouth daily with breakfast.      . Multiple Vitamin (MULTIVITAMIN WITH MINERALS) TABS Take 1 tablet by mouth daily.      . sertraline (ZOLOFT) 50 MG tablet Take 50 mg by mouth daily.        Results for orders placed during the hospital encounter of 01/25/13 (from the past 48 hour(s))  CBC     Status: None   Collection Time    01/25/13 12:53 PM      Result Value  Range   WBC 8.9  4.0 - 10.5 K/uL   RBC 4.29  4.22 - 5.81 MIL/uL   Hemoglobin 13.5  13.0 - 17.0 g/dL   HCT 45.4  09.8 - 11.9 %   MCV 91.4  78.0 - 100.0 fL   MCH 31.5  26.0 - 34.0 pg   MCHC 34.4  30.0 - 36.0 g/dL   RDW 14.7  82.9 - 56.2 %   Platelets 167  150 - 400 K/uL  BASIC METABOLIC PANEL     Status: Abnormal   Collection Time    01/25/13 12:53 PM      Result Value Range   Sodium 138  135 - 145 mEq/L   Potassium 4.1  3.5 - 5.1 mEq/L   Chloride 100  96 - 112 mEq/L   CO2 31  19 - 32 mEq/L   Glucose, Bld 152 (*) 70 - 99 mg/dL   BUN 19  6 - 23 mg/dL   Creatinine, Ser 1.30  0.50 - 1.35 mg/dL   Calcium 9.6  8.4 - 86.5 mg/dL   GFR calc non Af Amer 90 (*) >90 mL/min   GFR calc Af Amer >90  >  90 mL/min   Comment:            The eGFR has been calculated     using the CKD EPI equation.     This calculation has not been     validated in all clinical     situations.     eGFR's persistently     <90 mL/min signify     possible Chronic Kidney Disease.  GLUCOSE, CAPILLARY     Status: Abnormal   Collection Time    02/07/13  1:01 PM      Result Value Range   Glucose-Capillary 132 (*) 70 - 99 mg/dL   No results found.  ROS  No recent f/c/n/v/wt loss.  Blood pressure 130/77, pulse 57, temperature 97.9 F (36.6 C), temperature source Oral, resp. rate 20, height 5\' 10"  (1.778 m), weight 94.348 kg (208 lb), SpO2 100.00%. Physical Exam  wn wd male in nad.  A and O x 4.  Mood and affect normal.  EOMI.  Respirations unlabored.  R foot with swelling and erythema medially at the hallux mpj.  TTP at the MPJ and IPJ.  5/5 strength in PF and DF of the toes and ankle.  Sens to LT intact throughout the foot.  Assessment/Plan Right hallux and 1st MT osteomyelitis and hallux MPJ pyarthrosis - to OR for right first ray amputation.  The risks and benefits of the alternative treatment options have been discussed in detail.  The patient wishes to proceed with surgery and specifically understands risks of  bleeding, infection, nerve damage, blood clots, need for additional surgery, amputation and death.   Toni Arthurs 2013-02-07, 2:29 PM

## 2013-01-25 NOTE — Transfer of Care (Signed)
Immediate Anesthesia Transfer of Care Note  Patient: Trevor Grimes  Procedure(s) Performed: Procedure(s): RIGHT 1ST RAY AMPUTATION  (Right) IRRIGATION AND DEBRIDEMENT Right Hallux (Right)  Patient Location: PACU  Anesthesia Type:General  Level of Consciousness: awake and patient cooperative  Airway & Oxygen Therapy: Patient Spontanous Breathing and Patient connected to face mask oxygen  Post-op Assessment: Report given to PACU RN and Post -op Vital signs reviewed and stable  Post vital signs: Reviewed and stable  Complications: No apparent anesthesia complications

## 2013-01-25 NOTE — Anesthesia Procedure Notes (Signed)
Anesthesia Regional Block:  Popliteal block  Pre-Anesthetic Checklist: ,, timeout performed, Correct Patient, Correct Site, Correct Laterality, Correct Procedure, Correct Position, site marked, Risks and benefits discussed,  Surgical consent,  Pre-op evaluation,  At surgeon's request and post-op pain management  Laterality: Right  Prep: chloraprep and alcohol swabs       Needles:  Injection technique: Single-shot  Needle Type: Echogenic Stimulator Needle          Additional Needles:  Procedures: ultrasound guided (picture in chart) and nerve stimulator Popliteal block  Nerve Stimulator or Paresthesia:  Response: plantar flexion, 0.45 mA,   Additional Responses:   Narrative:  Start time: 01/25/2013 2:00 PM End time: 01/25/2013 2:10 PM  Performed by: Personally  Anesthesiologist: J. Adonis Huguenin, MD  Additional Notes: A functioning IV was confirmed and monitors were applied.  Sterile prep and drape, hand hygiene and sterile gloves were used.  Negative aspiration and test dose prior to incremental administration of local anesthetic. The patient tolerated the procedure well.Ultrasound  guidance: relevant anatomy identified, needle position confirmed, local anesthetic spread visualized around nerve(s), vascular puncture avoided.  Image printed for medical record.   Popliteal block

## 2013-01-26 DIAGNOSIS — M9080 Osteopathy in diseases classified elsewhere, unspecified site: Secondary | ICD-10-CM

## 2013-01-26 DIAGNOSIS — E1169 Type 2 diabetes mellitus with other specified complication: Secondary | ICD-10-CM

## 2013-01-26 DIAGNOSIS — M869 Osteomyelitis, unspecified: Secondary | ICD-10-CM

## 2013-01-26 LAB — GLUCOSE, CAPILLARY: Glucose-Capillary: 140 mg/dL — ABNORMAL HIGH (ref 70–99)

## 2013-01-26 NOTE — Consult Note (Addendum)
[facility]  Total days of antibiotics 2        Day 2 vanco        Day 2 piptazo               Reason for Consult: abtx for diabetic foot osteomyelitis POD#1  s/p 1st ray amputation    Referring Physician: hewitt  Active Problems:   * No active hospital problems. *    HPI: Trevor Grimes is a 62 y.o. male with PMH of diabetes who presents with 6 month history of R forefoot pain. He initially had unilateral right ankle swelling, pain on ambulation after airplane ride to [facility]. He felt that after a few days it localized to the ball of his foot, where he would notice a quarter size red, tender spot to ball of his right foot, but attributed it to driving extensively and walking more than usual for a 2 wk holiday. He initially saw Dr. Lajoyce Corners who was evaluated him for gout vs. Infection. He had received steroid injection of the hallux MP joint which only improved symptoms for 3 days, then he received allopurinol and a week's worth of doxycycline, which only temporarily relieved his symptoms. [facility] where he had a nerve block to PIP in order to get aspirate of DIP to see if this was podagra. The aspirate was unsuccessful and the patient remained having an erythematous swollen great toe involving ball of foot but never extending to arch of foot. He underwent bone scan which was not conclusive for infection. They did diagnose him with a bone spur and recommended surgery, but the patient decided to seek a 3rd opinion since his symptoms have not resolved, where he met Dr Victorino Dike. Dr. Rexene Edison arranged for an MRI on 3/3 which revealed osteomyelitis of the hallux, proximal phalanx, and the first metatarsal as well as pyarthrosis of the hallux MP joint. He was admitted for amputation of the 1st ray of right foot. The patient denies any recall of any acute illnesses. He did have mild diarrhea when he took a week's worth of doxycycline. He denies f/c/ns.   Past  Medical History  Diagnosis Date  . Thyroid disease   . Anxiety   . Depression   . Diabetes mellitus without complication   . Kidney stones     hx of  . Arthritis   . Chronic back pain     Allergies: No Known Allergies   MEDICATIONS: . docusate sodium  100 mg Oral BID  . glyBURIDE  2.5 mg Oral BID WC  . insulin aspart  0-15 Units Subcutaneous TID WC  . levothyroxine  25 mcg Oral QAC breakfast  . metFORMIN  1,000 mg Oral Q breakfast  . multivitamin with minerals  1 tablet Oral Daily  . piperacillin-tazobactam (ZOSYN)  IV  3.375 g Intravenous Q8H  . senna  2 tablet Oral BID  . sertraline  50 mg Oral Daily  . vancomycin  1,000 mg Intravenous Q8H    History  Substance Use Topics  . Smoking status: Never Smoker   . Smokeless tobacco: Not on file  . Alcohol Use: Yes     Comment: "occas"    History reviewed. No pertinent family history.  Review of Systems  Constitutional: Negative for fever, chills, diaphoresis, activity change, appetite change, fatigue and unexpected weight change.  HENT: Negative for congestion, sore throat, rhinorrhea, sneezing, trouble swallowing and sinus pressure.  Eyes: Negative  for photophobia and visual disturbance.  Respiratory: Negative for cough, chest tightness, shortness of breath, wheezing and stridor.  Cardiovascular: Negative for chest pain, palpitations and leg swelling.  Gastrointestinal: Negative for nausea, vomiting, abdominal pain, diarrhea, constipation, blood in stool, abdominal distention and anal bleeding.  Genitourinary: Negative for dysuria, hematuria, flank pain and difficulty urinating.  Musculoskeletal: per hpi Skin: Negative for color change, pallor, rash and wound.  Neurological: Negative for dizziness, tremors, weakness and light-headedness.  Hematological: Negative for adenopathy. Does not bruise/bleed easily.  Psychiatric/Behavioral: Negative for behavioral problems, confusion, sleep disturbance, dysphoric mood, decreased  concentration and agitation.     OBJECTIVE: Temp:  [97.3 F (36.3 C)-98.6 F (37 C)] 98.4 F (36.9 C) (03/07 1330) Pulse Rate:  [50-76] 70 (03/07 1330) Resp:  [9-21] 18 (03/07 1330) BP: (100-149)/(61-72) 124/72 mmHg (03/07 1330) SpO2:  [95 %-100 %] 96 % (03/07 1330) Physical Exam  Constitutional: He is oriented to person, place, and time. He appears well-developed and well-nourished. No distress.  HENT:  Mouth/Throat: Oropharynx is clear and moist. No oropharyngeal exudate.  Cardiovascular: Normal rate, regular rhythm and normal heart sounds. Exam reveals no gallop and no friction rub.  No murmur heard.  Pulmonary/Chest: Effort normal and breath sounds normal. No respiratory distress. He has no wheezes.  Lymphadenopathy:  no cervical adenopathy.  Neurological: He is alert and oriented to person, place, and time.  Skin: right foot is wrapped from surgery Psychiatric: He has a normal mood and affect. His behavior is normal.    LABS: Results for orders placed during the hospital encounter of 01/25/13 (from the past 48 hour(s))  CBC     Status: None   Collection Time    01/25/13 12:53 PM      Result Value Range   WBC 8.9  4.0 - 10.5 K/uL   RBC 4.29  4.22 - 5.81 MIL/uL   Hemoglobin 13.5  13.0 - 17.0 g/dL   HCT 16.1  09.6 - 04.5 %   MCV 91.4  78.0 - 100.0 fL   MCH 31.5  26.0 - 34.0 pg   MCHC 34.4  30.0 - 36.0 g/dL   RDW 40.9  81.1 - 91.4 %   Platelets 167  150 - 400 K/uL  BASIC METABOLIC PANEL     Status: Abnormal   Collection Time    01/25/13 12:53 PM      Result Value Range   Sodium 138  135 - 145 mEq/L   Potassium 4.1  3.5 - 5.1 mEq/L   Chloride 100  96 - 112 mEq/L   CO2 31  19 - 32 mEq/L   Glucose, Bld 152 (*) 70 - 99 mg/dL   BUN 19  6 - 23 mg/dL   Creatinine, Ser 7.82  0.50 - 1.35 mg/dL   Calcium 9.6  8.4 - 95.6 mg/dL   GFR calc non Af Amer 90 (*) >90 mL/min   GFR calc Af Amer >90  >90 mL/min   Comment:            The eGFR has been calculated     using the CKD  EPI equation.     This calculation has not been     validated in all clinical     situations.     eGFR's persistently     <90 mL/min signify     possible Chronic Kidney Disease.  GLUCOSE, CAPILLARY     Status: Abnormal   Collection Time    01/25/13  1:01 PM  Result Value Range   Glucose-Capillary 132 (*) 70 - 99 mg/dL  SURGICAL PCR SCREEN     Status: None   Collection Time    01/25/13  1:08 PM      Result Value Range   MRSA, PCR NEGATIVE  NEGATIVE   Staphylococcus aureus NEGATIVE  NEGATIVE   Comment:            The Xpert SA Assay (FDA     approved for NASAL specimens     in patients over 46 years of age),     is one component of     a comprehensive surveillance     program.  Test performance has     been validated by [employer] for patients greater     than or equal to 20 year old.     It is not intended     to diagnose infection nor to     guide or monitor treatment.  TISSUE CULTURE     Status: None   Collection Time    01/25/13  3:30 PM      Result Value Range   Specimen Description TISSUE TOE RIGHT     Special Requests DEEP TISSUE SPECIMEN     Gram Stain PENDING     Culture NO GROWTH 1 DAY     Report Status PENDING    GLUCOSE, CAPILLARY     Status: Abnormal   Collection Time    01/25/13  4:27 PM      Result Value Range   Glucose-Capillary 179 (*) 70 - 99 mg/dL   Comment 1 Notify RN    GLUCOSE, CAPILLARY     Status: Abnormal   Collection Time    01/25/13  5:34 PM      Result Value Range   Glucose-Capillary 150 (*) 70 - 99 mg/dL  GLUCOSE, CAPILLARY     Status: Abnormal   Collection Time    01/25/13  9:51 PM      Result Value Range   Glucose-Capillary 188 (*) 70 - 99 mg/dL  GLUCOSE, CAPILLARY     Status: Abnormal   Collection Time    01/26/13  7:33 AM      Result Value Range   Glucose-Capillary 160 (*) 70 - 99 mg/dL  GLUCOSE, CAPILLARY     Status: Abnormal   Collection Time    01/26/13 11:10 AM      Result Value Range   Glucose-Capillary 162  (*) 70 - 99 mg/dL    MICRO: 3/6 tissue culture PENDING MSSA/MRSA screen negative  IMAGING: No results found.  Assessment/Plan:  62yo Male with DM presents with osteomyelitis of the hallux, proximal phalanx, and the first metatarsal POD#1 s/p 1st ray amputation currently on vancomycin and piptazo. The chronology of events is not completely clear if he also had concurrent gout episode. He did have 2 invasive procedures that could have introduced bacteria but appears by history that some of the symptoms occurred prior to steroid injection.  - since patient has undergone amputation, would recommend a short course of oral antibiotics, as "mop-up", as long as it is believed that all infected tissue is removed.   - would wait on culture results to narrow the regimen of antibiotics. If culture results remain negative, can treat with bactrim DS 1 tab BID and cipro 750mg  BID x 5 days.(to include the days that he has received IV antibiotics)  Dr. Ninetta Lights available over the weekend to follow up on cultures  and provide final recs  [place] B. Drue Second MD MPH [facility] [phone]

## 2013-01-26 NOTE — Progress Notes (Signed)
CARE MANAGEMENT NOTE 01/26/2013  Patient:  Trevor Grimes, Trevor Grimes   Account Number:  [id number]  Date Initiated:  01/26/2013  Documentation initiated by:  Vance Peper  Subjective/Objective Assessment:   62 yr male s/p right foot first ray amputation.     Action/Plan:   CM spoke with patient and wife concerning DME needs. They have rolling walker. Will wait to see if he would do better with a crutch. CM will follow   Anticipated DC Date:  01/27/2013   Anticipated DC Plan:           Choice offered to / List presented to:             Status of service:  In process, will continue to follow

## 2013-01-26 NOTE — Progress Notes (Signed)
Subjective: 1 Day Post-Op Procedure(s) (LRB): RIGHT 1ST RAY AMPUTATION  (Right) IRRIGATION AND DEBRIDEMENT Right Hallux (Right) Patient reports pain as 0 on 0-10 scale.  Restful night overnight.  Objective: Vital signs in last 24 hours: Temp:  [97.3 F (36.3 C)-98.6 F (37 C)] 98.6 F (37 C) (03/07 0529) Pulse Rate:  [50-76] 74 (03/07 0529) Resp:  [9-21] 16 (03/07 0529) BP: (100-149)/(61-77) 116/64 mmHg (03/07 0529) SpO2:  [95 %-100 %] 95 % (03/07 0529)  Intake/Output from previous day: 03/06 0701 - 03/07 0700 In: 985 [I.V.:985] Out: 100 [Blood:100] Intake/Output this shift:     Recent Labs  01/25/13 1253  HGB 13.5    Recent Labs  01/25/13 1253  WBC 8.9  RBC 4.29  HCT 39.2  PLT 167    Recent Labs  01/25/13 1253  NA 138  K 4.1  CL 100  CO2 31  BUN 19  CREATININE 0.91  GLUCOSE 152*  CALCIUM 9.6    Wounds dressed nd dry.  NVI at the foot.  Assessment/Plan: 1 Day Post-Op Procedure(s) (LRB): RIGHT 1ST RAY AMPUTATION  (Right) IRRIGATION AND DEBRIDEMENT Right Hallux (Right) ID consult today.  WBAT on R LE in hard sole shoe.  Toni Arthurs 01/26/2013, 9:52 AM

## 2013-01-26 NOTE — Op Note (Signed)
NAMEKALEL, HARTY NO.:  [id number]  MEDICAL RECORD NO.:  [id number]  LOCATION:  5N23C                        FACILITY:  [facility]  PHYSICIAN:  Toni Arthurs, MD        DATE OF BIRTH:  1951/05/13  DATE OF PROCEDURE:  01/25/2013 DATE OF DISCHARGE:                              OPERATIVE REPORT   PREOPERATIVE DIAGNOSES:  Right first metatarsal and hallux osteomyelitis, right first metatarsophalangeal joint septic arthritis.  POSTOPERATIVE DIAGNOSES:  Right first metatarsal and hallux osteomyelitis, right first metatarsophalangeal joint septic arthritis.  PROCEDURE:  Right foot first ray amputation.  SURGEON:  Toni Arthurs, MD  ANESTHESIA:  General, regional.  ESTIMATED BLOOD LOSS:  Minimal.  TOURNIQUET TIME:  Approximately 20 minutes with an ankle Esmarch.  COMPLICATIONS:  None apparent.  DISPOSITION:  Extubated, awake, and stable to recovery.  SPECIMENS:  Deep tissue to Microbiology for aerobic and anaerobic culture.  INDICATIONS FOR PROCEDURE:  The patient is a 62 year old male with past medical history significant for diabetes and hypothyroidism.  He developed pain at the right forefoot approximately 6 months ago.  He ultimately had a steroid injection of the hallux MP joint.  He had persistent pain and swelling of the forefoot.  He was evaluated for infection and gout on multiple occasions.  Ultimately he had an MRI of the right forefoot with and without IV contrast that revealed osteomyelitis of the hallux, proximal phalanx, and the first metatarsal as well as pyarthrosis of the hallux MP joint.  He presents now for amputation of his first ray as definitive treatment for this persistent infection.  He has not been on any antibiotics recently.  He understands the risks and benefits of the alternative treatment options and elects this surgical treatment.  He specifically understands risks of bleeding, infection, nerve damage, blood clots, need for  additional surgery, revision amputation, and death.  PROCEDURE IN DETAIL:  After preoperative consent was obtained and the correct operative site was identified, the patient was brought to the operating room and placed supine on the operating table.  General anesthesia was administered.  Preoperative antibiotics were held. Surgical time-out was taken.  The right lower extremity was prepped and draped in standard sterile fashion.  The foot was exsanguinated and a 4- inch Esmarch tourniquet was wrapped around the ankle.  A longitudinal incision was made over the medial eminence.  Sharp dissection was carried down through the skin and subcutaneous tissue.  The medial joint capsule was incised.  The joint was opened and there was noted to be purulent material.  Specimens of this material were collected with a rongeur and sent to Microbiology for aerobic and anaerobic culture.  IV vancomycin was then administered.  A racquet style incision was then made around the base of the toe. Subperiosteal dissection was carried along the metatarsal shaft proximally.  The toe was disarticulated through the hallux MP joint.  An oscillating saw was used to cut through the metatarsal approximately 2 cm from its base.  Immediately evident was destruction of the plantar lateral cortex of the bone and apparent involvement of the infection this far proximal.  The decision was made to disarticulate the first metatarsal at  the tarsometatarsal joint. Once this was done, the first metatarsal was passed off the field as a specimen to Pathology along with the hallux.  Both sesamoids were completely excised.  The wound was irrigated copiously with 3 L of normal saline.  There was no evidence of purulence or necrotic material remaining.  The tourniquet was released.  Hemostasis was achieved.  The deep soft tissues were approximated with simple sutures of 2-0 PDS. Horizontal mattress sutures of 2-0 nylon were used to  close the skin incision.  Sterile dressings were applied followed by compression wrap. The patient was then awakened from anesthesia and transported to recovery room in stable condition.  FOLLOWUP PLAN:  The patient will be admitted and started on IV vancomycin and Zosyn.  He will be seen by Dr. Luciana Axe of Infectious Diseases.  He will be weightbearing as tolerated in the hard-sole shoe. He will likely need home IV antibiotics via PICC line.     Toni Arthurs, MD     JH/MEDQ  D:  01/25/2013  T:  01/26/2013  Job:  [id number]

## 2013-01-27 DIAGNOSIS — M9080 Osteopathy in diseases classified elsewhere, unspecified site: Secondary | ICD-10-CM

## 2013-01-27 DIAGNOSIS — A4101 Sepsis due to Methicillin susceptible Staphylococcus aureus: Secondary | ICD-10-CM

## 2013-01-27 DIAGNOSIS — E1169 Type 2 diabetes mellitus with other specified complication: Principal | ICD-10-CM

## 2013-01-27 DIAGNOSIS — M869 Osteomyelitis, unspecified: Secondary | ICD-10-CM

## 2013-01-27 LAB — GLUCOSE, CAPILLARY
Glucose-Capillary: 123 mg/dL — ABNORMAL HIGH (ref 70–99)
Glucose-Capillary: 110 mg/dL — ABNORMAL HIGH (ref 70–99)

## 2013-01-27 LAB — SEDIMENTATION RATE: Sed Rate: 52 mm/hr — ABNORMAL HIGH (ref 0–16)

## 2013-01-27 MED ORDER — FLEET ENEMA 7-19 GM/118ML RE ENEM: 1.0000 | ENEMA | Freq: Every day | RECTAL | Status: DC | PRN

## 2013-01-27 MED ORDER — HYDROCODONE-ACETAMINOPHEN 5-325 MG PO TABS
1.0000 | ORAL_TABLET | ORAL | Status: DC | PRN
  Administered 2013-01-27: 2 via ORAL
  Administered 2013-01-27 (×3): 1 via ORAL
  Administered 2013-01-28: 2 via ORAL
  Administered 2013-01-28 – 2013-01-29 (×3): 1 via ORAL
  Filled 2013-01-27: qty 2
  Filled 2013-01-27: qty 1
  Filled 2013-01-27: qty 2
  Filled 2013-01-27 (×4): qty 1
  Filled 2013-01-27: qty 2

## 2013-01-27 MED ORDER — POLYETHYLENE GLYCOL 3350 17 G PO PACK
17.0000 g | PACK | Freq: Two times a day (BID) | ORAL | Status: DC
  Filled 2013-01-27 (×4): qty 1

## 2013-01-27 MED ORDER — BISACODYL 10 MG RE SUPP
10.0000 mg | Freq: Every day | RECTAL | Status: DC | PRN
  Administered 2013-01-27: 10 mg via RECTAL
  Filled 2013-01-27: qty 1

## 2013-01-27 NOTE — Progress Notes (Signed)
INFECTIOUS DISEASE PROGRESS NOTE  ID: Trevor Grimes is a 62 y.o. male with   Active Problems:   * No active hospital problems. *  Subjective: Without complaints  Abtx:  Anti-infectives   Start     Dose/Rate Route Frequency Ordered Stop   01/25/13 2200  vancomycin (VANCOCIN) IVPB 1000 mg/200 mL premix     1,000 mg 200 mL/hr over 60 Minutes Intravenous Every 8 hours 01/25/13 1912     01/25/13 2000  piperacillin-tazobactam (ZOSYN) IVPB 3.375 g     3.375 g 12.5 mL/hr over 240 Minutes Intravenous 3 times per day 01/25/13 1912     01/25/13 1545  vancomycin (VANCOCIN) IVPB 1000 mg/200 mL premix  Status:  Discontinued     1,000 mg 200 mL/hr over 60 Minutes Intravenous  Once 01/25/13 1551 01/25/13 1852      Medications:  Scheduled: . docusate sodium  100 mg Oral BID  . glyBURIDE  2.5 mg Oral BID WC  . insulin aspart  0-15 Units Subcutaneous TID WC  . levothyroxine  25 mcg Oral QAC breakfast  . metFORMIN  1,000 mg Oral Q breakfast  . multivitamin with minerals  1 tablet Oral Daily  . piperacillin-tazobactam (ZOSYN)  IV  3.375 g Intravenous Q8H  . senna  2 tablet Oral BID  . sertraline  50 mg Oral Daily  . vancomycin  1,000 mg Intravenous Q8H    Objective: Vital signs in last 24 hours: Temp:  [98.4 F (36.9 C)-98.9 F (37.2 C)] 98.7 F (37.1 C) (03/08 0600) Pulse Rate:  [70-72] 71 (03/08 0600) Resp:  [18] 18 (03/08 0600) BP: (111-124)/(54-72) 111/54 mmHg (03/08 0600) SpO2:  [96 %-99 %] 96 % (03/08 0600)   General appearance: alert, cooperative and no distress Extremities: RLE warpped, clean  Lab Results  Recent Labs  01/25/13 1253  WBC 8.9  HGB 13.5  HCT 39.2  NA 138  K 4.1  CL 100  CO2 31  BUN 19  CREATININE 0.91   Liver Panel No results found for this basename: PROT, ALBUMIN, AST, ALT, ALKPHOS, BILITOT, BILIDIR, IBILI,  in the last 72 hours Sedimentation Rate  Recent Labs  01/27/13 0630  ESRSEDRATE 52*   C-Reactive Protein No results found  for this basename: CRP,  in the last 72 hours  Microbiology: Recent Results (from the past 240 hour(s))  SURGICAL PCR SCREEN     Status: None   Collection Time    01/25/13  1:08 PM      Result Value Range Status   MRSA, PCR NEGATIVE  NEGATIVE Final   Staphylococcus aureus NEGATIVE  NEGATIVE Final   Comment:            The Xpert SA Assay (FDA     approved for NASAL specimens     in patients over 65 years of age),     is one component of     a comprehensive surveillance     program.  Test performance has     been validated by [employer] for patients greater     than or equal to 51 year old.     It is not intended     to diagnose infection nor to     guide or monitor treatment.  TISSUE CULTURE     Status: None   Collection Time    01/25/13  3:30 PM      Result Value Range Status   Specimen Description TISSUE TOE  RIGHT   Final   Special Requests DEEP TISSUE SPECIMEN   Final   Gram Stain     Final   Value: FEW WBC PRESENT,BOTH PMN AND MONONUCLEAR     RARE GRAM POSITIVE COCCI IN PAIRS   Culture     Final   Value: FEW STAPHYLOCOCCUS AUREUS     Note: RIFAMPIN AND GENTAMICIN SHOULD NOT BE USED AS SINGLE DRUGS FOR TREATMENT OF STAPH INFECTIONS.   Report Status PENDING   Incomplete    Studies/Results: No results found.   Assessment/Plan: Diabetic Foot, osteomyelitis S/p resection of R 1st ray (3-7) Tissue Cx Staph aureus  Total days of antibiotics 3 (vanco/zosyn) Will stop zosyn Place pic or midline Plan for 14 days of total anbx after amputation.  Will f/u in AM and make final rec based on his final Cx.  Explained to pt and wife at length (> )  Trevor Grimes Infectious Diseases [phone] 01/27/2013, 12:07 PM   LOS: 2 days

## 2013-01-27 NOTE — Progress Notes (Signed)
Patient ID: Trevor Grimes, male   DOB: [birth year], 62 y.o.   MRN: [id number] Subjective: 2 Days Post-Op Procedure(s) (LRB): RIGHT 1ST RAY AMPUTATION  (Right) IRRIGATION AND DEBRIDEMENT Right Hallux (Right)    Patient reports pain as moderate.  Doesn't seem to tolerate oxycodone wants to switch to hydrocodone  Objective:   VITALS:   Filed Vitals:   01/27/13 0600  BP: 111/54  Pulse: 71  Temp: 98.7 F (37.1 C)  Resp: 18    Incision: dressing C/D/I post op dressing dry, post op shoe in place  LABS  Recent Labs  01/25/13 1253  HGB 13.5  HCT 39.2  WBC 8.9  PLT 167     Recent Labs  01/25/13 1253  NA 138  K 4.1  BUN 19  CREATININE 0.91  GLUCOSE 152*    No results found for this basename: LABPT, INR,  in the last 72 hours   Assessment/Plan: 2 Days Post-Op Procedure(s) (LRB): RIGHT 1ST RAY AMPUTATION  (Right) IRRIGATION AND DEBRIDEMENT Right Hallux (Right)   Advance diet Up with therapy Continue IV antibiotics for right foot infection until cultures return, ID on board for recommendations at discharge Change to hydrocodone

## 2013-01-27 NOTE — Progress Notes (Signed)
Physical Therapy Evaluation Patient Details Name: Trevor Grimes MRN: [id number] DOB: 1951/03/05 Today's Date: 01/27/2013 Time: 4696-2952 PT Time Calculation (min): 33 min  PT Assessment / Plan / Recommendation Clinical Impression  Pt is 62 yo male s/p right great toe amp who is mobilizing well with RW. Educated him on activity level after d/c as well as acceptable exercises. Pt will benefit from PT to increase independence and safety for d/c home. Recommend f/u with HHPT.    PT Assessment  Patient needs continued PT services    Follow Up Recommendations  Home health PT;Supervision - Intermittent    Does the patient have the potential to tolerate intense rehabilitation      Barriers to Discharge None      Equipment Recommendations  None recommended by PT    Recommendations for Other Services     Frequency Min 3X/week    Precautions / Restrictions Precautions Precautions: None Required Braces or Orthoses: Other Brace/Splint Other Brace/Splint: post-op shoe left Restrictions Weight Bearing Restrictions: Yes RLE Weight Bearing: Weight bearing as tolerated   Pertinent Vitals/Pain 6/10 right foot pain, premedicated      Mobility  Bed Mobility Bed Mobility: Supine to Sit Supine to Sit: 7: Independent;HOB flat Transfers Transfers: Sit to Stand;Stand to Sit Sit to Stand: 6: Modified independent (Device/Increase time);From bed Stand to Sit: 6: Modified independent (Device/Increase time);To chair/3-in-1 Details for Transfer Assistance: vc's for hand placement Ambulation/Gait Ambulation/Gait Assistance: 5: Supervision Ambulation Distance (Feet): 200 Feet Assistive device: Rolling walker Ambulation/Gait Assistance Details: vc's for sequencing as well as pattern. Pt tends to externally rotate RLE more than left, made aware of this, likely due in part to bandaging Gait Pattern: Step-through pattern;Decreased stance time - right Gait velocity: decreased Stairs: Yes Stairs  Assistance: 4: Min assist Stairs Assistance Details (indicate cue type and reason): vc's for sequencing and discussed his use of post at his house compared to rail in ortho gym Stair Management Technique: One rail Left Number of Stairs: 2 Wheelchair Mobility Wheelchair Mobility: No    Exercises General Exercises - Lower Extremity Ankle Circles/Pumps: AROM;Both;20 reps;Seated   PT Diagnosis: Abnormality of gait;Acute pain  PT Problem List: Decreased mobility;Decreased knowledge of use of DME;Decreased knowledge of precautions;Pain PT Treatment Interventions: DME instruction;Gait training;Stair training;Functional mobility training;Therapeutic activities;Therapeutic exercise;Patient/family education   PT Goals Acute Rehab PT Goals PT Goal Formulation: With patient Time For Goal Achievement: 02/03/13 Potential to Achieve Goals: Good Pt will Ambulate: >150 feet;with modified independence;with rolling walker PT Goal: Ambulate - Progress: Goal set today Pt will Go Up / Down Stairs: 1-2 stairs;with supervision PT Goal: Up/Down Stairs - Progress: Goal set today  Visit Information  Last PT Received On: 01/27/13 Assistance Needed: +1    Subjective Data  Subjective: pt reports it hurts less than he expected Patient Stated Goal: return to home and work   Prior Functioning  Home Living Lives With: Spouse Available Help at Discharge: Family;Available 24 hours/day Type of Home: House Home Access: Stairs to enter [employer] of Steps: 3 Entrance Stairs-Rails: None (but does have a post he can grab) Home Layout: One level Home Adaptive Equipment: Walker - rolling Prior Function Level of Independence: Independent Able to Take Stairs?: Yes Driving: Yes Vocation: Full time employment Communication Communication: No difficulties    Cognition  Cognition Overall Cognitive Status: Appears within functional limits for tasks assessed/performed Arousal/Alertness:  Awake/alert Orientation Level: Oriented X4 / Intact Behavior During Session: [facility] for tasks performed    Extremity/Trunk Assessment Right Upper  Extremity Assessment RUE ROM/Strength/Tone: Within functional levels Left Upper Extremity Assessment LUE ROM/Strength/Tone: Within functional levels Right Lower Extremity Assessment RLE ROM/Strength/Tone: Deficits RLE ROM/Strength/Tone Deficits: pt with full ROM at hip and knee, ankle NT due to dressing, pt instructed in ankle pumps RLE Sensation: WFL - Light Touch RLE Coordination: WFL - gross motor Left Lower Extremity Assessment LLE ROM/Strength/Tone: Within functional levels LLE Sensation: WFL - Light Touch;WFL - Proprioception LLE Coordination: WFL - gross motor Trunk Assessment Trunk Assessment: Normal   Balance Balance Balance Assessed: Yes Static Standing Balance Static Standing - Balance Support: No upper extremity supported;During functional activity Static Standing - Level of Assistance: 5: Stand by assistance  End of Session PT - End of Session Equipment Utilized During Treatment: Gait belt Activity Tolerance: Patient tolerated treatment well Patient left: in chair;with call bell/phone within reach;with family/visitor present Nurse Communication: Mobility status  GP   Lyanne Co, PT  Acute Rehab Services  [phone]   Lyanne Co 01/27/2013, 3:01 PM

## 2013-01-28 MED ORDER — CEFAZOLIN SODIUM 1-5 GM-% IV SOLN
1.0000 g | Freq: Three times a day (TID) | INTRAVENOUS | Status: DC
  Administered 2013-01-28 – 2013-01-29 (×4): 1 g via INTRAVENOUS
  Filled 2013-01-28 (×5): qty 50

## 2013-01-28 NOTE — Progress Notes (Signed)
ANTIBIOTIC CONSULT NOTE Pharmacy Consult for Vancomycin and Zosyn Indication: Right 1st metatarsal and hallux osteomyelitis (s/p toe amputation) and septic arthritis  No Known Allergies  Labs:  Recent Labs  01/25/13 1253  WBC 8.9  HGB 13.5  PLT 167  CREATININE 0.91   Estimated Creatinine Clearance: 98.3 ml/min (by C-G formula based on Cr of 0.91).    Microbiology: Recent Results (from the past 720 hour(s))  SURGICAL PCR SCREEN     Status: None   Collection Time    01/25/13  1:08 PM      Result Value Range Status   MRSA, PCR NEGATIVE  NEGATIVE Final   Staphylococcus aureus NEGATIVE  NEGATIVE Final   Comment:            The Xpert SA Assay (FDA     approved for NASAL specimens     in patients over 86 years of age),     is one component of     a comprehensive surveillance     program.  Test performance has     been validated by [employer] for patients greater     than or equal to 19 year old.     It is not intended     to diagnose infection nor to     guide or monitor treatment.  TISSUE CULTURE     Status: None   Collection Time    01/25/13  3:30 PM      Result Value Range Status   Specimen Description TISSUE TOE RIGHT   Final   Special Requests DEEP TISSUE SPECIMEN   Final   Gram Stain     Final   Value: FEW WBC PRESENT,BOTH PMN AND MONONUCLEAR     RARE GRAM POSITIVE COCCI IN PAIRS   Culture     Final   Value: FEW STAPHYLOCOCCUS AUREUS     Note: RIFAMPIN AND GENTAMICIN SHOULD NOT BE USED AS SINGLE DRUGS FOR TREATMENT OF STAPH INFECTIONS.   Report Status PENDING   Incomplete    Assessment: 62 yo M admitted  for R 1st toe amputation 2/2 osteomyelitis.  Pt has had foot pain for the last 6 months and has been treated with outpatient antibiotics without resolve of symptoms.  Culture with a few staph aureus, afebrile, Scr stable  Planning home Monday  Goal of Therapy:  Vancomycin trough level 15-20 mcg/ml  Plan:  1) Continue Vancomycin 1gm IV Q8h  2)  Continue Zosyn 3.375 gm IV q8h (4 hour infusion). 3) Continue to follow.  Thank you. Okey Regal, PharmD [phone]  01/28/2013 11:26 AM

## 2013-01-28 NOTE — Progress Notes (Signed)
Subjective: 3 Days Post-Op Procedure(s) (LRB): RIGHT 1ST RAY AMPUTATION  (Right) IRRIGATION AND DEBRIDEMENT Right Hallux (Right) Patient reports pain as 2 on 0-10 scale.    Objective: Vital signs in last 24 hours: Temp:  [97.5 F (36.4 C)-98.4 F (36.9 C)] 97.5 F (36.4 C) (03/09 0536) Pulse Rate:  [61-79] 79 (03/09 0536) Resp:  [18] 18 (03/09 0536) BP: (109-120)/(57-67) 120/67 mmHg (03/09 0536) SpO2:  [95 %-96 %] 96 % (03/09 0536)  Intake/Output from previous day: 03/08 0701 - 03/09 0700 In: 240 [P.O.:240] Out: 1700 [Urine:1700] Intake/Output this shift:     Recent Labs  01/25/13 1253  HGB 13.5    Recent Labs  01/25/13 1253  WBC 8.9  RBC 4.29  HCT 39.2  PLT 167    Recent Labs  01/25/13 1253  NA 138  K 4.1  CL 100  CO2 31  BUN 19  CREATININE 0.91  GLUCOSE 152*  CALCIUM 9.6   No results found for this basename: LABPT, INR,  in the last 72 hours  Incision: dressing C/D/I  Assessment/Plan: 3 Days Post-Op Procedure(s) (LRB): RIGHT 1ST RAY AMPUTATION  (Right) IRRIGATION AND DEBRIDEMENT Right Hallux (Right) Up with therapy Plan for discharge tomorrow Check cultures Sens pending.  BEANE,JEFFREY C 01/28/2013, 9:28 AM

## 2013-01-28 NOTE — Progress Notes (Addendum)
INFECTIOUS DISEASE PROGRESS NOTE  ID: Trevor Grimes is a 62 y.o. male with   Active Problems:   * No active hospital problems. *  Subjective: Some drowsiness, otherwise doing well. Has ambulated.   Abtx:  Anti-infectives   Start     Dose/Rate Route Frequency Ordered Stop   01/25/13 2200  vancomycin (VANCOCIN) IVPB 1000 mg/200 mL premix     1,000 mg 200 mL/hr over 60 Minutes Intravenous Every 8 hours 01/25/13 1912     01/25/13 2000  piperacillin-tazobactam (ZOSYN) IVPB 3.375 g  Status:  Discontinued     3.375 g 12.5 mL/hr over 240 Minutes Intravenous 3 times per day 01/25/13 1912 01/27/13 1223   01/25/13 1545  vancomycin (VANCOCIN) IVPB 1000 mg/200 mL premix  Status:  Discontinued     1,000 mg 200 mL/hr over 60 Minutes Intravenous  Once 01/25/13 1551 01/25/13 1852      Medications:  Scheduled: . docusate sodium  100 mg Oral BID  . glyBURIDE  2.5 mg Oral BID WC  . insulin aspart  0-15 Units Subcutaneous TID WC  . levothyroxine  25 mcg Oral QAC breakfast  . metFORMIN  1,000 mg Oral Q breakfast  . multivitamin with minerals  1 tablet Oral Daily  . polyethylene glycol  17 g Oral BID  . senna  2 tablet Oral BID  . sertraline  50 mg Oral Daily  . vancomycin  1,000 mg Intravenous Q8H    Objective: Vital signs in last 24 hours: Temp:  [97.5 F (36.4 C)-98.4 F (36.9 C)] 97.5 F (36.4 C) (03/09 0536) Pulse Rate:  [61-79] 79 (03/09 0536) Resp:  [18] 18 (03/09 0536) BP: (109-120)/(57-67) 120/67 mmHg (03/09 0536) SpO2:  [95 %-96 %] 96 % (03/09 0536)   General appearance: alert, cooperative and no distress Extremities: R foot wrapped.  Lab Results  Recent Labs  01/25/13 1253  WBC 8.9  HGB 13.5  HCT 39.2  NA 138  K 4.1  CL 100  CO2 31  BUN 19  CREATININE 0.91   Liver Panel No results found for this basename: PROT, ALBUMIN, AST, ALT, ALKPHOS, BILITOT, BILIDIR, IBILI,  in the last 72 hours Sedimentation Rate  Recent Labs  01/27/13 0630  ESRSEDRATE 52*     C-Reactive Protein No results found for this basename: CRP,  in the last 72 hours  Microbiology: Recent Results (from the past 240 hour(s))  SURGICAL PCR SCREEN     Status: None   Collection Time    01/25/13  1:08 PM      Result Value Range Status   MRSA, PCR NEGATIVE  NEGATIVE Final   Staphylococcus aureus NEGATIVE  NEGATIVE Final   Comment:            The Xpert SA Assay (FDA     approved for NASAL specimens     in patients over 16 years of age),     is one component of     a comprehensive surveillance     program.  Test performance has     been validated by [employer] for patients greater     than or equal to 78 year old.     It is not intended     to diagnose infection nor to     guide or monitor treatment.  TISSUE CULTURE     Status: None   Collection Time    01/25/13  3:30 PM      Result  Value Range Status   Specimen Description TISSUE TOE RIGHT   Final   Special Requests DEEP TISSUE SPECIMEN   Final   Gram Stain     Final   Value: FEW WBC PRESENT,BOTH PMN AND MONONUCLEAR     RARE GRAM POSITIVE COCCI IN PAIRS   Culture     Final   Value: FEW STAPHYLOCOCCUS AUREUS     Note: RIFAMPIN AND GENTAMICIN SHOULD NOT BE USED AS SINGLE DRUGS FOR TREATMENT OF STAPH INFECTIONS.   Report Status PENDING   Incomplete    Studies/Results: No results found.   Assessment/Plan: Diabetic Foot, osteomyelitis  S/p resection of R 1st ray (3-7)  Tissue Cx Staph aureus Plan for 14 days of anbx after surgery  Needs midline/PIC.  Awaiting his staph sensi- will be out this afternoon, delayed due to computer malfxn at lab. Can go home this PM after evening doses of anbx , line placed, home health arranged..... Spoke with pt and wife about staph infections.  Total days of antibiotics: 4          [name] Infectious Diseases [phone] 01/28/2013, 12:06 PM   LOS: 3 days   Addendum His screening test is negative for MRSA.  He can go home to complete his anbx with IV  ancef- 1 g IVPB q8 h.  Will need 10 more days available if questions

## 2013-01-29 ENCOUNTER — Encounter (HOSPITAL_COMMUNITY): Payer: Self-pay | Admitting: Orthopedic Surgery

## 2013-01-29 ENCOUNTER — Inpatient Hospital Stay (HOSPITAL_COMMUNITY): Payer: BC Managed Care – PPO

## 2013-01-29 LAB — TISSUE CULTURE

## 2013-01-29 LAB — GLUCOSE, CAPILLARY: Glucose-Capillary: 109 mg/dL — ABNORMAL HIGH (ref 70–99)

## 2013-01-29 LAB — BASIC METABOLIC PANEL
CO2: 30 mEq/L (ref 19–32)
Calcium: 9.4 mg/dL (ref 8.4–10.5)
Creatinine, Ser: 0.99 mg/dL (ref 0.50–1.35)
Glucose, Bld: 214 mg/dL — ABNORMAL HIGH (ref 70–99)

## 2013-01-29 LAB — C-REACTIVE PROTEIN: CRP: 4.2 mg/dL — ABNORMAL HIGH (ref <0.60)

## 2013-01-29 MED ORDER — HYDROCODONE-ACETAMINOPHEN 5-325 MG PO TABS: 1.0000 | ORAL_TABLET | Freq: Four times a day (QID) | ORAL | Status: DC | PRN

## 2013-01-29 MED ORDER — CEFAZOLIN SODIUM 1-5 GM-% IV SOLN: 1.0000 g | Freq: Three times a day (TID) | INTRAVENOUS | Status: DC

## 2013-01-29 NOTE — Progress Notes (Signed)
Physical Therapy Treatment Patient Details Name: Trevor Grimes MRN: [id number] DOB: [birth year] Today's Date: 01/29/2013 Time: 4098-1191 PT Time Calculation (min): 10 min  PT Assessment / Plan / Recommendation Comments on Treatment Session  Pt is a 62 y.o. s.p R great toe amp. Pt moving well today, plans to D/C home with wife this afternoon. Would benefit from f/u with HHPT. Pt is clear from PT stand point to D/C home with wife.    Follow Up Recommendations  Home health PT;Supervision - Intermittent     Does the patient have the potential to tolerate intense rehabilitation     Barriers to Discharge        Equipment Recommendations  None recommended by PT    Recommendations for Other Services    Frequency Min 3X/week   Plan Discharge plan remains appropriate;Frequency remains appropriate    Precautions / Restrictions Precautions Required Braces or Orthoses: Other Brace/Splint Other Brace/Splint: post-op shoe left Restrictions Weight Bearing Restrictions: Yes RLE Weight Bearing: Weight bearing as tolerated   Pertinent Vitals/Pain Pt said "pain is not bad, hardly there this morning".    Mobility  Bed Mobility Bed Mobility: Supine to Sit Supine to Sit: 7: Independent;HOB flat Transfers Transfers: Sit to Stand;Stand to Sit Sit to Stand: 6: Modified independent (Device/Increase time);From bed Stand to Sit: 6: Modified independent (Device/Increase time);To chair/3-in-1 Details for Transfer Assistance: cues for hand placement when descending to chair Ambulation/Gait Ambulation/Gait Assistance: 5: Supervision Ambulation Distance (Feet): 150 Feet Assistive device: Rolling walker Ambulation/Gait Assistance Details: cues for gait sequencing and upright posture, had tendency to look at ground Gait Pattern: Step-through pattern;Decreased stance time - right Gait velocity: decreased Stairs: No Wheelchair Mobility Wheelchair Mobility: No    Exercises     PT Diagnosis:    PT  Problem List:   PT Treatment Interventions:     PT Goals Acute Rehab PT Goals PT Goal Formulation: With patient Time For Goal Achievement: 02/03/13 Potential to Achieve Goals: Good PT Goal: Ambulate - Progress: Progressing toward goal  Visit Information  Last PT Received On: 01/29/13 Assistance Needed: +1    Subjective Data  Subjective: Pt states he is going home today. Would like to walk before D/C and review stair ambulation Patient Stated Goal: return home   Cognition  Cognition Overall Cognitive Status: Appears within functional limits for tasks assessed/performed Arousal/Alertness: Awake/alert Orientation Level: Oriented X4 / Intact Behavior During Session: [facility] for tasks performed    Balance  Balance Balance Assessed: No  End of Session PT - End of Session Equipment Utilized During Treatment: Gait belt Activity Tolerance: Patient tolerated treatment well Patient left: Other (comment) (in wheelchair with transport to get PICC line. ) Nurse Communication: Mobility status   GP     Donell Sievert,  [phone] 01/29/2013, 11:31 AM

## 2013-01-29 NOTE — Progress Notes (Signed)
CARE MANAGEMENT NOTE 01/29/2013  Patient:  JAKYRON, FABRO   Account Number:  [id number]  Date Initiated:  01/26/2013  Documentation initiated by:  Vance Peper  Subjective/Objective Assessment:   62 yr male s/p right foot first ray amputation.     Action/Plan:   CM spoke with patient and wife concerning DME needs. They have rolling walker. Will wait to see if he would do better with a crutch. CM will follow   Anticipated DC Date:  01/29/2013   Anticipated DC Plan:  HOME W HOME HEALTH SERVICES      DC Planning Services  CM consult      [facility] Choice  HOME HEALTH   Choice offered to / List presented to:  C-1 Patient        HH arranged  IV Antibiotics      HH agency  [facility]   Status of service:  Completed, signed off Medicare Important Message given?   (If response is "NO", the following Medicare IM given date fields will be blank) Date Medicare IM given:   Date Additional Medicare IM given:    Discharge Disposition:  HOME W HOME HEALTH SERVICES  Per UR Regulation:    If discussed at Long Length of Stay Meetings, dates discussed:    Comments:  01/29/13 11:00 Vance Peper, RN BSN Case Manager CM spoke with patient and wife concerning need for [facility] RN for IV antibiotics. Patient has PICC Line. Choice was offered. CM called consult to [facility] with [facility]. They will speak with patient concerning start of care today.

## 2013-01-29 NOTE — Procedures (Signed)
Successful LUE PICC LINE TIPS SVC/RA NO COMP STABLE FULL REPORT IN PACS

## 2013-01-29 NOTE — Progress Notes (Signed)
Pt discharged to home after IV dose ancef completed. Discharge order in chart. Wife at bedside, given and reviewed AVS and educated on PICC use/care. Volunteer up to wheel pt down to lobby and had cart to assist wife with belongings.   Delynn Flavin, RN, BSN

## 2013-01-29 NOTE — Discharge Summary (Signed)
Physician Discharge Summary  Patient ID: Trevor Grimes MRN: [id number] DOB/AGE: 1951/08/25 62 y.o.  Admit date: 01/25/2013 Discharge date: 01/29/2013  Admission Diagnoses:  Diabetes, htn, osteomyelitis  Discharge Diagnoses:  Active Problems:   * No active hospital problems. * same s/p 1st ray amputation  Discharged Condition: stable  Hospital Course: Pt was admitted and taken to the OR for 1st ray amputation.  He tolerated the procedure well and remained on the inpatient ward until discharge.  His cul;tures grew sensitive staph aureus.  He is discharged with a 10 day course of IV cefazolin.  Consults: ID  Significant Diagnostic Studies: microbiology: wound culture: positive for staph aureus  Treatments: antibiotics: vancomycin  Discharge Exam: Blood pressure 137/68, pulse 58, temperature 97.5 F (36.4 C), temperature source Oral, resp. rate 18, height 5\' 10"  (1.778 m), weight 94.348 kg (208 lb), SpO2 100.00%. wound c/d/i.  NVI at foot.  Disposition: 01-Home or Self Care  Discharge Orders   Future Orders Complete By Expires     Call MD / Call 911  As directed     Comments:      If you experience chest pain or shortness of breath, CALL 911 and be transported to the hospital emergency room.  If you develope a fever above 101 F, pus (white drainage) or increased drainage or redness at the wound, or calf pain, call your surgeon's office.    Constipation Prevention  As directed     Comments:      Drink plenty of fluids.  Prune juice may be helpful.  You may use a stool softener, such as Colace (over the counter) 100 mg twice a day.  Use MiraLax (over the counter) for constipation as needed.    Diet - low sodium heart healthy  As directed     Increase activity slowly as tolerated  As directed         Medication List    STOP taking these medications       HYDROMET 5-1.5 MG/5ML syrup  Generic drug:  HYDROcodone-homatropine      TAKE these medications       ceFAZolin 1-5  GM-%  Commonly known as:  ANCEF  Inject 50 mLs (1 g total) into the vein every 8 (eight) hours.     glyBURIDE 2.5 MG tablet  Commonly known as:  DIABETA  Take 2.5 mg by mouth 2 (two) times daily with a meal.     HYDROcodone-acetaminophen 5-325 MG per tablet  Commonly known as:  NORCO/VICODIN  Take 1-2 tablets by mouth every 6 (six) hours as needed for pain.     levothyroxine 25 MCG tablet  Commonly known as:  SYNTHROID, LEVOTHROID  Take 25 mcg by mouth daily.     metFORMIN 1000 MG tablet  Commonly known as:  GLUCOPHAGE  Take 1,000 mg by mouth daily with breakfast.     multivitamin with minerals Tabs  Take 1 tablet by mouth daily.     sertraline 50 MG tablet  Commonly known as:  ZOLOFT  Take 50 mg by mouth daily.           Follow-up Information   Follow up with HEWITT, Jonny Ruiz, MD. Schedule an appointment as soon as possible for a visit in 2 weeks.   Contact information:   [address] [place] [address] [phone]       Signed: Toni Arthurs 01/29/2013, 7:00 AM

## 2013-01-30 ENCOUNTER — Telehealth (HOSPITAL_COMMUNITY): Payer: Self-pay | Admitting: *Deleted

## 2013-02-01 ENCOUNTER — Encounter: Payer: Self-pay | Admitting: Internal Medicine

## 2013-02-01 ENCOUNTER — Ambulatory Visit (INDEPENDENT_AMBULATORY_CARE_PROVIDER_SITE_OTHER): Payer: BC Managed Care – PPO | Admitting: Internal Medicine

## 2013-02-01 ENCOUNTER — Ambulatory Visit: Payer: BC Managed Care – PPO | Admitting: Internal Medicine

## 2013-02-01 VITALS — BP 127/85 | HR 73 | Temp 97.3°F | Ht 70.0 in | Wt 203.0 lb

## 2013-02-01 DIAGNOSIS — M86179 Other acute osteomyelitis, unspecified ankle and foot: Secondary | ICD-10-CM

## 2013-02-01 DIAGNOSIS — M86171 Other acute osteomyelitis, right ankle and foot: Secondary | ICD-10-CM

## 2013-02-01 NOTE — Progress Notes (Signed)
[facility] NOTE  RFV: hospital follow up for MSSA osteomyelitis s/p 1st ray amputation of right foot Subjective:    Patient ID: Trevor Grimes, male    DOB: 08-Apr-1951, 62 y.o.   MRN: [id number]  HPI  Trevor Grimes is a 62 y.o. male with PMH of diabetes who presents with 6 month history of R forefoot pain. He initially had unilateral right ankle swelling, pain on ambulation after airplane ride to [facility]. He felt that after a few days it localized to the ball of his foot, where he would notice a quarter size red, tender spot to ball of his right foot, but attributed it to driving extensively and walking more than usual for a 2 wk holiday. He initially evaluated for gout vs. Infection. He had received steroid injection of the hallux MP joint which only improved symptoms for 3 days, then he received allopurinol and a week's worth of doxycycline, which only temporarily relieved his symptoms. [facility] where he had a nerve block to PIP in order to get aspirate of DIP to see if this was podagra. The aspirate was unsuccessful and the patient remained having an erythematous swollen great toe involving ball of foot but never extending to arch of foot.hedecided to seek a 3rd opinion since his symptoms have not resolved,an MRI on 3/3 which revealed osteomyelitis of the hallux, proximal phalanx, and the first metatarsal as well as pyarthrosis of the hallux MP joint. He was admitted for amputation of the 1st ray of right foot on 01/25/13. The patient denies any recall of any acute illnesses. He was initially started on piptazo and switched to cefazolin for total of 2 wks until 02/08/13  Since being discharge doing well with his antibiotics, only complaint of feeling fatigue , "out of it" "not clearly thinking the same". No change in pain medications. No rash with antibiotics, no fever,chills, nightsweats. Swelling of the dorsum of right foot is starting to improve.  Current Outpatient  Prescriptions on File Prior to Visit  Medication Sig Dispense Refill  . ceFAZolin (ANCEF) 1-5 GM-% Inject 50 mLs (1 g total) into the vein every 8 (eight) hours.  50 mL  29  . glyBURIDE (DIABETA) 2.5 MG tablet Take 2.5 mg by mouth 2 (two) times daily with a meal.      . HYDROcodone-acetaminophen (NORCO/VICODIN) 5-325 MG per tablet Take 1-2 tablets by mouth every 6 (six) hours as needed for pain.  30 tablet  0  . levothyroxine (SYNTHROID, LEVOTHROID) 25 MCG tablet Take 25 mcg by mouth daily.      . metFORMIN (GLUCOPHAGE) 1000 MG tablet Take 1,000 mg by mouth daily with breakfast.      . Multiple Vitamin (MULTIVITAMIN WITH MINERALS) TABS Take 1 tablet by mouth daily.      . sertraline (ZOLOFT) 50 MG tablet Take 50 mg by mouth daily.       No current facility-administered medications on file prior to visit.   Active Ambulatory Problems    Diagnosis Date Noted  . No Active Ambulatory Problems   Resolved Ambulatory Problems    Diagnosis Date Noted  . No Resolved Ambulatory Problems   Past Medical History  Diagnosis Date  . Thyroid disease   . Anxiety   . Depression   . Diabetes mellitus without complication   . Kidney stones   . Arthritis   . Chronic back pain    History  Substance Use Topics  . Smoking status: Never  Smoker   . Smokeless tobacco: Not on file  . Alcohol Use: Yes     Comment: "occas"  family history is not on file.   Review of Systems 10 point ROS is otherwise negative, other than what is mentioned in hpi    Objective:   Physical Exam BP 127/85  Pulse 73  Temp(Src) 97.3 F (36.3 C) (Oral)  Ht 5\' 10"  (1.778 m)  Wt 203 lb (92.08 kg)  BMI 29.13 kg/m2 Physical Exam  Constitutional: He is oriented to person, place, and time. He appears well-developed and well-nourished. No distress.  HENT:  Mouth/Throat: Oropharynx is clear and moist. No oropharyngeal exudate.  Cardiovascular: Normal rate, regular rhythm and normal heart sounds. Exam reveals no gallop and  no friction rub.  No murmur heard.  Pulmonary/Chest: Effort normal and breath sounds normal. No respiratory distress. He has no wheezes.  Lymphadenopathy:  no cervical adenopathy.  Skin: foot is showing good healing, slight erythema to proximal 2-3 stitches      Assessment & Plan:   MSSA osteomyelitis of great toe s/p 1st ray amputation = continue with cefazolinfor deep tissue infection/osteomyelitis. Needs 1 week addn treatment. Will [facility] with [facility] to draw labs prior to thur. We will increase the dosage to cefazolin 2gm IV Q 8hr (if it is not already at this dose) ; will convey info to [facility].  rtc in 1 wk

## 2013-02-02 ENCOUNTER — Telehealth: Payer: Self-pay | Admitting: *Deleted

## 2013-02-02 NOTE — Telephone Encounter (Signed)
Per Dr. Feliz Beam orders, called [facility] pharmacist Amy and requested that patient's Ancef be changed from 1gm q8 hours to 2gm q8 hours and that [facility] have labs (ESR and CRP) drawn on Wednesday 02/07/13 before his appointment at [facility] on 02/08/13.  Amy spoke with the patient and coordinated a plan to use 2 syringes at each dosing until more antibiotic is delivered.  RN called the patient and confirmed this with him.  Andree Coss, RN

## 2013-02-08 ENCOUNTER — Encounter: Payer: Self-pay | Admitting: Internal Medicine

## 2013-02-08 ENCOUNTER — Ambulatory Visit (INDEPENDENT_AMBULATORY_CARE_PROVIDER_SITE_OTHER): Payer: BC Managed Care – PPO | Admitting: Internal Medicine

## 2013-02-08 VITALS — BP 130/86 | HR 89 | Temp 97.5°F | Wt 207.0 lb

## 2013-02-08 DIAGNOSIS — M869 Osteomyelitis, unspecified: Secondary | ICD-10-CM

## 2013-02-08 NOTE — Progress Notes (Signed)
[facility] NOTE  RFV: MSSA osteomyelitis of right great toe s/p 1st ray amputation Subjective:    Patient ID: Trevor Grimes, male    DOB: [birth year], 62 y.o.   MRN: [id number]  HPI Trevor Grimes is a 62 y.o. male with PMH of diabetes who presents with 6 month history of R forefoot pain. He initially had unilateral right ankle swelling, pain on ambulation after airplane ride to [facility]. He felt that after a few days it localized to the ball of his foot, where he would notice a quarter size red, tender spot to ball of his right foot, but attributed it to driving extensively and walking more than usual for a 2 wk holiday. He initially evaluated for gout vs. Infection. He had received steroid injection of the hallux MP joint which only improved symptoms for 3 days; due to prolonged pain and erythema he underwent an MRI on 3/3 which revealed osteomyelitis of the hallux, proximal phalanx, and the first metatarsal as well as pyarthrosis of the hallux MP joint. He underwent an amputation of the 1st ray of right foot on 01/25/13.  He was initially started on piptazo and switched to cefazolin for total of 2 wks until 02/08/13.   No rash with antibiotics, no fever,chills, nightsweats. Has 1 loose stool per day. Swelling of the dorsum of right foot is starting to improve.  Current Outpatient Prescriptions on File Prior to Visit  Medication Sig Dispense Refill  . ACCU-CHEK SMARTVIEW test strip       . glyBURIDE (DIABETA) 2.5 MG tablet Take 2.5 mg by mouth 2 (two) times daily with a meal.      . HYDROcodone-acetaminophen (NORCO/VICODIN) 5-325 MG per tablet Take 1-2 tablets by mouth every 6 (six) hours as needed for pain.  30 tablet  0  . levothyroxine (SYNTHROID, LEVOTHROID) 25 MCG tablet Take 25 mcg by mouth daily.      . metFORMIN (GLUCOPHAGE) 1000 MG tablet Take 1,000 mg by mouth daily with breakfast.      . Multiple Vitamin (MULTIVITAMIN WITH MINERALS) TABS Take 1 tablet by mouth daily.      .  sertraline (ZOLOFT) 50 MG tablet Take 50 mg by mouth daily.       No current facility-administered medications on file prior to visit.   Active Ambulatory Problems    Diagnosis Date Noted  . No Active Ambulatory Problems   Resolved Ambulatory Problems    Diagnosis Date Noted  . No Resolved Ambulatory Problems   Past Medical History  Diagnosis Date  . Thyroid disease   . Anxiety   . Depression   . Diabetes mellitus without complication   . Kidney stones   . Arthritis   . Chronic back pain    FH and SH unchanged since last visit  Review of Systems Left temple tightness    Objective:   Physical Exam BP 130/86  Pulse 89  Temp(Src) 97.5 F (36.4 C) (Oral)  Wt 207 lb (93.895 kg)  BMI 29.7 kg/m2 gen = a x o by 3 in NAD Skin = left foot healing well, no longer has erythema at proximal sutures, less swelling than last week but still not symmetric. Incision site is approximated, healing well, no dehiscence. Ext = left arm picc line site is c/d/i  Labs:   Lab Results  Component Value Date   ESRSEDRATE 52* 01/27/2013   Lab Results  Component Value Date   CRP 4.2* 01/27/2013   02/07/2013: sed rate 12;  crp <0.5     Assessment & Plan:  MSSA osteomyelitis of left 1st great toe s/p 1st ray amputation, on cefazolin for 2 wks post surgery for "mop-up" = will discontinue picc line today. Due to inflammatory markers being at base line, we will no longer extend the patient on oral antibiotics. He has finished his course of therapy. We will pull picc line at this visit.  picc line pulled- 46cm, same as insertion  Left temple discomfort = try tylenol or alleve  Dr. Zachery Dauer, hewitt

## 2013-02-19 ENCOUNTER — Telehealth: Payer: Self-pay | Admitting: *Deleted

## 2013-02-19 NOTE — Telephone Encounter (Signed)
Patient called, reporting diarrhea up to twice a day.  He would prefer to see Dr. Drue Second.  Appointment given for her first available - 03/01/13 at 9:15.  Patient will come by 02/20/13 to pick up a stool sample kit.

## 2013-02-20 ENCOUNTER — Other Ambulatory Visit: Payer: Self-pay | Admitting: Internal Medicine

## 2013-02-20 ENCOUNTER — Other Ambulatory Visit: Payer: BC Managed Care – PPO

## 2013-02-20 DIAGNOSIS — R197 Diarrhea, unspecified: Secondary | ICD-10-CM

## 2013-02-20 NOTE — Telephone Encounter (Signed)
Can you add him onto my Thursday schedule on April 3rd. i will place order for cdiff  For him

## 2013-02-21 ENCOUNTER — Telehealth: Payer: Self-pay | Admitting: *Deleted

## 2013-02-21 LAB — CLOSTRIDIUM DIFFICILE BY PCR: Toxigenic C. Difficile by PCR: NOT DETECTED

## 2013-02-21 NOTE — Telephone Encounter (Signed)
Patient called for the results from the stool sample he left yesterday - CDiff PCR was negative.  Patient states he saw his PCP earlier today, said she suggested yogurt and probiotics to help his "gut realign" after his antibiotic therapy.  Patient wants to know if he still needs his appointment with you tomorrow 02/22/13 at 8:45.  He "wouldn't want to clog up your schedule" if he doesn't need a visit. Andree Coss, RN

## 2013-02-22 ENCOUNTER — Ambulatory Visit: Payer: BC Managed Care – PPO | Admitting: Internal Medicine

## 2013-03-01 ENCOUNTER — Ambulatory Visit: Payer: BC Managed Care – PPO | Admitting: Internal Medicine

## 2016-01-22 DIAGNOSIS — S83281D Other tear of lateral meniscus, current injury, right knee, subsequent encounter: Secondary | ICD-10-CM | POA: Diagnosis not present

## 2016-01-23 DIAGNOSIS — M542 Cervicalgia: Secondary | ICD-10-CM | POA: Diagnosis not present

## 2016-01-23 DIAGNOSIS — M4722 Other spondylosis with radiculopathy, cervical region: Secondary | ICD-10-CM | POA: Diagnosis not present

## 2016-01-23 DIAGNOSIS — M6281 Muscle weakness (generalized): Secondary | ICD-10-CM | POA: Diagnosis not present

## 2016-01-23 DIAGNOSIS — Z6829 Body mass index (BMI) 29.0-29.9, adult: Secondary | ICD-10-CM | POA: Diagnosis not present

## 2016-01-23 DIAGNOSIS — M5030 Other cervical disc degeneration, unspecified cervical region: Secondary | ICD-10-CM | POA: Diagnosis not present

## 2016-01-23 DIAGNOSIS — M5412 Radiculopathy, cervical region: Secondary | ICD-10-CM | POA: Diagnosis not present

## 2016-01-24 DIAGNOSIS — M4722 Other spondylosis with radiculopathy, cervical region: Secondary | ICD-10-CM | POA: Diagnosis not present

## 2016-01-24 DIAGNOSIS — M4723 Other spondylosis with radiculopathy, cervicothoracic region: Secondary | ICD-10-CM | POA: Diagnosis not present

## 2016-01-24 DIAGNOSIS — M4802 Spinal stenosis, cervical region: Secondary | ICD-10-CM | POA: Diagnosis not present

## 2016-01-28 DIAGNOSIS — M25561 Pain in right knee: Secondary | ICD-10-CM | POA: Diagnosis not present

## 2016-01-28 DIAGNOSIS — M2021 Hallux rigidus, right foot: Secondary | ICD-10-CM | POA: Diagnosis not present

## 2016-02-04 DIAGNOSIS — M4802 Spinal stenosis, cervical region: Secondary | ICD-10-CM | POA: Diagnosis not present

## 2016-02-04 DIAGNOSIS — M4722 Other spondylosis with radiculopathy, cervical region: Secondary | ICD-10-CM | POA: Diagnosis not present

## 2016-02-04 DIAGNOSIS — M542 Cervicalgia: Secondary | ICD-10-CM | POA: Diagnosis not present

## 2016-02-04 DIAGNOSIS — M5412 Radiculopathy, cervical region: Secondary | ICD-10-CM | POA: Diagnosis not present

## 2016-02-04 DIAGNOSIS — M5030 Other cervical disc degeneration, unspecified cervical region: Secondary | ICD-10-CM | POA: Diagnosis not present

## 2016-02-04 DIAGNOSIS — Z6829 Body mass index (BMI) 29.0-29.9, adult: Secondary | ICD-10-CM | POA: Diagnosis not present

## 2016-02-12 DIAGNOSIS — J320 Chronic maxillary sinusitis: Secondary | ICD-10-CM | POA: Diagnosis not present

## 2016-02-19 DIAGNOSIS — M25561 Pain in right knee: Secondary | ICD-10-CM | POA: Diagnosis not present

## 2016-02-19 DIAGNOSIS — M170 Bilateral primary osteoarthritis of knee: Secondary | ICD-10-CM | POA: Diagnosis not present

## 2016-02-19 DIAGNOSIS — M25562 Pain in left knee: Secondary | ICD-10-CM | POA: Diagnosis not present

## 2016-02-19 DIAGNOSIS — R262 Difficulty in walking, not elsewhere classified: Secondary | ICD-10-CM | POA: Diagnosis not present

## 2016-02-24 DIAGNOSIS — M170 Bilateral primary osteoarthritis of knee: Secondary | ICD-10-CM | POA: Diagnosis not present

## 2016-02-24 DIAGNOSIS — M25561 Pain in right knee: Secondary | ICD-10-CM | POA: Diagnosis not present

## 2016-02-24 DIAGNOSIS — M25562 Pain in left knee: Secondary | ICD-10-CM | POA: Diagnosis not present

## 2016-02-26 DIAGNOSIS — M4722 Other spondylosis with radiculopathy, cervical region: Secondary | ICD-10-CM | POA: Diagnosis not present

## 2016-02-26 DIAGNOSIS — M50222 Other cervical disc displacement at C5-C6 level: Secondary | ICD-10-CM | POA: Diagnosis not present

## 2016-02-26 DIAGNOSIS — M50221 Other cervical disc displacement at C4-C5 level: Secondary | ICD-10-CM | POA: Diagnosis not present

## 2016-02-26 DIAGNOSIS — M47816 Spondylosis without myelopathy or radiculopathy, lumbar region: Secondary | ICD-10-CM | POA: Diagnosis not present

## 2016-02-26 DIAGNOSIS — M5136 Other intervertebral disc degeneration, lumbar region: Secondary | ICD-10-CM | POA: Diagnosis not present

## 2016-02-26 DIAGNOSIS — M5030 Other cervical disc degeneration, unspecified cervical region: Secondary | ICD-10-CM | POA: Diagnosis not present

## 2016-03-04 DIAGNOSIS — M5090 Cervical disc disorder, unspecified, unspecified cervical region: Secondary | ICD-10-CM | POA: Diagnosis not present

## 2016-03-04 DIAGNOSIS — Z89411 Acquired absence of right great toe: Secondary | ICD-10-CM | POA: Diagnosis not present

## 2016-03-04 DIAGNOSIS — E1165 Type 2 diabetes mellitus with hyperglycemia: Secondary | ICD-10-CM | POA: Diagnosis not present

## 2016-03-04 DIAGNOSIS — H6503 Acute serous otitis media, bilateral: Secondary | ICD-10-CM | POA: Diagnosis not present

## 2016-03-04 DIAGNOSIS — Z7984 Long term (current) use of oral hypoglycemic drugs: Secondary | ICD-10-CM | POA: Diagnosis not present

## 2016-03-16 DIAGNOSIS — M5030 Other cervical disc degeneration, unspecified cervical region: Secondary | ICD-10-CM | POA: Diagnosis not present

## 2016-03-16 DIAGNOSIS — Z981 Arthrodesis status: Secondary | ICD-10-CM | POA: Diagnosis not present

## 2016-03-16 DIAGNOSIS — M4722 Other spondylosis with radiculopathy, cervical region: Secondary | ICD-10-CM | POA: Diagnosis not present

## 2016-03-16 DIAGNOSIS — Z6828 Body mass index (BMI) 28.0-28.9, adult: Secondary | ICD-10-CM | POA: Diagnosis not present

## 2016-03-16 DIAGNOSIS — M542 Cervicalgia: Secondary | ICD-10-CM | POA: Diagnosis not present

## 2016-03-17 DIAGNOSIS — M170 Bilateral primary osteoarthritis of knee: Secondary | ICD-10-CM | POA: Diagnosis not present

## 2016-03-17 DIAGNOSIS — M25562 Pain in left knee: Secondary | ICD-10-CM | POA: Diagnosis not present

## 2016-03-17 DIAGNOSIS — M25561 Pain in right knee: Secondary | ICD-10-CM | POA: Diagnosis not present

## 2016-03-17 DIAGNOSIS — R2689 Other abnormalities of gait and mobility: Secondary | ICD-10-CM | POA: Diagnosis not present

## 2016-03-24 DIAGNOSIS — M170 Bilateral primary osteoarthritis of knee: Secondary | ICD-10-CM | POA: Diagnosis not present

## 2016-03-24 DIAGNOSIS — R2689 Other abnormalities of gait and mobility: Secondary | ICD-10-CM | POA: Diagnosis not present

## 2016-03-24 DIAGNOSIS — M25562 Pain in left knee: Secondary | ICD-10-CM | POA: Diagnosis not present

## 2016-03-24 DIAGNOSIS — M25561 Pain in right knee: Secondary | ICD-10-CM | POA: Diagnosis not present

## 2016-03-29 DIAGNOSIS — R2689 Other abnormalities of gait and mobility: Secondary | ICD-10-CM | POA: Diagnosis not present

## 2016-03-29 DIAGNOSIS — M25561 Pain in right knee: Secondary | ICD-10-CM | POA: Diagnosis not present

## 2016-03-29 DIAGNOSIS — M25562 Pain in left knee: Secondary | ICD-10-CM | POA: Diagnosis not present

## 2016-03-29 DIAGNOSIS — M170 Bilateral primary osteoarthritis of knee: Secondary | ICD-10-CM | POA: Diagnosis not present

## 2016-03-31 DIAGNOSIS — R2689 Other abnormalities of gait and mobility: Secondary | ICD-10-CM | POA: Diagnosis not present

## 2016-03-31 DIAGNOSIS — M25561 Pain in right knee: Secondary | ICD-10-CM | POA: Diagnosis not present

## 2016-03-31 DIAGNOSIS — M170 Bilateral primary osteoarthritis of knee: Secondary | ICD-10-CM | POA: Diagnosis not present

## 2016-03-31 DIAGNOSIS — M25562 Pain in left knee: Secondary | ICD-10-CM | POA: Diagnosis not present

## 2016-04-07 DIAGNOSIS — M170 Bilateral primary osteoarthritis of knee: Secondary | ICD-10-CM | POA: Diagnosis not present

## 2016-04-07 DIAGNOSIS — M25561 Pain in right knee: Secondary | ICD-10-CM | POA: Diagnosis not present

## 2016-04-07 DIAGNOSIS — M1711 Unilateral primary osteoarthritis, right knee: Secondary | ICD-10-CM | POA: Diagnosis not present

## 2016-04-07 DIAGNOSIS — R2689 Other abnormalities of gait and mobility: Secondary | ICD-10-CM | POA: Diagnosis not present

## 2016-04-07 DIAGNOSIS — M25562 Pain in left knee: Secondary | ICD-10-CM | POA: Diagnosis not present

## 2016-04-20 DIAGNOSIS — M170 Bilateral primary osteoarthritis of knee: Secondary | ICD-10-CM | POA: Diagnosis not present

## 2016-04-20 DIAGNOSIS — M25562 Pain in left knee: Secondary | ICD-10-CM | POA: Diagnosis not present

## 2016-04-20 DIAGNOSIS — R2689 Other abnormalities of gait and mobility: Secondary | ICD-10-CM | POA: Diagnosis not present

## 2016-04-20 DIAGNOSIS — M25561 Pain in right knee: Secondary | ICD-10-CM | POA: Diagnosis not present

## 2016-04-26 DIAGNOSIS — M2041 Other hammer toe(s) (acquired), right foot: Secondary | ICD-10-CM | POA: Diagnosis not present

## 2016-04-26 DIAGNOSIS — M2022 Hallux rigidus, left foot: Secondary | ICD-10-CM | POA: Diagnosis not present

## 2016-04-30 ENCOUNTER — Encounter (HOSPITAL_BASED_OUTPATIENT_CLINIC_OR_DEPARTMENT_OTHER)
Admission: RE | Admit: 2016-04-30 | Discharge: 2016-04-30 | Disposition: A | Discharge status: Home / Self Care | Payer: Medicare Other | Source: Ambulatory Visit | Attending: Orthopedic Surgery | Admitting: Orthopedic Surgery

## 2016-04-30 ENCOUNTER — Other Ambulatory Visit: Payer: Self-pay

## 2016-04-30 ENCOUNTER — Encounter (HOSPITAL_BASED_OUTPATIENT_CLINIC_OR_DEPARTMENT_OTHER): Payer: Self-pay | Admitting: *Deleted

## 2016-04-30 DIAGNOSIS — G8929 Other chronic pain: Secondary | ICD-10-CM | POA: Diagnosis not present

## 2016-04-30 DIAGNOSIS — Z89431 Acquired absence of right foot: Secondary | ICD-10-CM | POA: Diagnosis not present

## 2016-04-30 DIAGNOSIS — Z79899 Other long term (current) drug therapy: Secondary | ICD-10-CM | POA: Diagnosis not present

## 2016-04-30 DIAGNOSIS — F419 Anxiety disorder, unspecified: Secondary | ICD-10-CM | POA: Diagnosis not present

## 2016-04-30 DIAGNOSIS — M1990 Unspecified osteoarthritis, unspecified site: Secondary | ICD-10-CM | POA: Diagnosis not present

## 2016-04-30 DIAGNOSIS — E039 Hypothyroidism, unspecified: Secondary | ICD-10-CM | POA: Diagnosis not present

## 2016-04-30 DIAGNOSIS — M2022 Hallux rigidus, left foot: Secondary | ICD-10-CM | POA: Diagnosis not present

## 2016-04-30 DIAGNOSIS — M549 Dorsalgia, unspecified: Secondary | ICD-10-CM | POA: Diagnosis not present

## 2016-04-30 DIAGNOSIS — Z7984 Long term (current) use of oral hypoglycemic drugs: Secondary | ICD-10-CM | POA: Diagnosis not present

## 2016-04-30 DIAGNOSIS — G4733 Obstructive sleep apnea (adult) (pediatric): Secondary | ICD-10-CM | POA: Diagnosis not present

## 2016-04-30 DIAGNOSIS — E119 Type 2 diabetes mellitus without complications: Secondary | ICD-10-CM | POA: Diagnosis not present

## 2016-04-30 LAB — BASIC METABOLIC PANEL
ANION GAP: 6 (ref 5–15)
BUN: 23 mg/dL — AB (ref 6–20)
CHLORIDE: 105 mmol/L (ref 101–111)
CO2: 29 mmol/L (ref 22–32)
Calcium: 9.6 mg/dL (ref 8.9–10.3)
Creatinine, Ser: 0.88 mg/dL (ref 0.61–1.24)
GFR calc Af Amer: >60 mL/min (ref >60)
GLUCOSE: 81 mg/dL (ref 65–99)
POTASSIUM: 4.4 mmol/L (ref 3.5–5.1)
Sodium: 140 mmol/L (ref 135–145)

## 2016-05-03 ENCOUNTER — Other Ambulatory Visit: Payer: Self-pay | Admitting: Orthopedic Surgery

## 2016-05-06 ENCOUNTER — Encounter (HOSPITAL_BASED_OUTPATIENT_CLINIC_OR_DEPARTMENT_OTHER)
Admission: RE | Disposition: A | Discharge status: Home / Self Care | Payer: Self-pay | Source: Ambulatory Visit | Attending: Orthopedic Surgery

## 2016-05-06 ENCOUNTER — Ambulatory Visit (HOSPITAL_BASED_OUTPATIENT_CLINIC_OR_DEPARTMENT_OTHER): Payer: Medicare Other | Admitting: Anesthesiology

## 2016-05-06 ENCOUNTER — Ambulatory Visit (HOSPITAL_BASED_OUTPATIENT_CLINIC_OR_DEPARTMENT_OTHER)
Admission: RE | Admit: 2016-05-06 | Discharge: 2016-05-06 | Disposition: A | Discharge status: Home / Self Care | Payer: Medicare Other | Source: Ambulatory Visit | Attending: Orthopedic Surgery | Admitting: Orthopedic Surgery

## 2016-05-06 ENCOUNTER — Encounter (HOSPITAL_BASED_OUTPATIENT_CLINIC_OR_DEPARTMENT_OTHER): Payer: Self-pay | Admitting: *Deleted

## 2016-05-06 DIAGNOSIS — M2022 Hallux rigidus, left foot: Secondary | ICD-10-CM | POA: Insufficient documentation

## 2016-05-06 DIAGNOSIS — E119 Type 2 diabetes mellitus without complications: Secondary | ICD-10-CM | POA: Diagnosis not present

## 2016-05-06 DIAGNOSIS — M1990 Unspecified osteoarthritis, unspecified site: Secondary | ICD-10-CM | POA: Diagnosis not present

## 2016-05-06 DIAGNOSIS — Z79899 Other long term (current) drug therapy: Secondary | ICD-10-CM | POA: Insufficient documentation

## 2016-05-06 DIAGNOSIS — F419 Anxiety disorder, unspecified: Secondary | ICD-10-CM | POA: Insufficient documentation

## 2016-05-06 DIAGNOSIS — M549 Dorsalgia, unspecified: Secondary | ICD-10-CM | POA: Diagnosis not present

## 2016-05-06 DIAGNOSIS — G4733 Obstructive sleep apnea (adult) (pediatric): Secondary | ICD-10-CM | POA: Insufficient documentation

## 2016-05-06 DIAGNOSIS — E039 Hypothyroidism, unspecified: Secondary | ICD-10-CM | POA: Diagnosis not present

## 2016-05-06 DIAGNOSIS — G8929 Other chronic pain: Secondary | ICD-10-CM | POA: Insufficient documentation

## 2016-05-06 DIAGNOSIS — Z7984 Long term (current) use of oral hypoglycemic drugs: Secondary | ICD-10-CM | POA: Insufficient documentation

## 2016-05-06 DIAGNOSIS — Z89431 Acquired absence of right foot: Secondary | ICD-10-CM | POA: Insufficient documentation

## 2016-05-06 HISTORY — PX: CHEILECTOMY: SHX1336

## 2016-05-06 HISTORY — DX: Hypothyroidism, unspecified: E03.9

## 2016-05-06 HISTORY — DX: Sleep apnea, unspecified: G47.30

## 2016-05-06 LAB — GLUCOSE, CAPILLARY
GLUCOSE-CAPILLARY: 138 mg/dL — AB (ref 65–99)
Glucose-Capillary: 131 mg/dL — ABNORMAL HIGH (ref 65–99)

## 2016-05-06 SURGERY — CHEILECTOMY
Anesthesia: Monitor Anesthesia Care | Site: Foot | Laterality: Left

## 2016-05-06 MED ORDER — FENTANYL CITRATE (PF) 100 MCG/2ML IJ SOLN: 25.0000 ug | INTRAMUSCULAR | Status: DC | PRN

## 2016-05-06 MED ORDER — LIDOCAINE 2% (20 MG/ML) 5 ML SYRINGE
INTRAMUSCULAR | Status: DC | PRN
Start: 2016-05-06 — End: 2016-05-06
  Administered 2016-05-06: 50 mg via INTRAVENOUS

## 2016-05-06 MED ORDER — SODIUM CHLORIDE 0.9 % IV SOLN: INTRAVENOUS | Status: DC

## 2016-05-06 MED ORDER — CEFAZOLIN SODIUM-DEXTROSE 2-4 GM/100ML-% IV SOLN
INTRAVENOUS | Status: AC
  Filled 2016-05-06: qty 100

## 2016-05-06 MED ORDER — MIDAZOLAM HCL 2 MG/2ML IJ SOLN
1.0000 mg | INTRAMUSCULAR | Status: DC | PRN
  Administered 2016-05-06: 2 mg via INTRAVENOUS

## 2016-05-06 MED ORDER — DOCUSATE SODIUM 100 MG PO CAPS: 100.0000 mg | ORAL_CAPSULE | Freq: Two times a day (BID) | ORAL | Status: DC

## 2016-05-06 MED ORDER — ONDANSETRON HCL 4 MG/2ML IJ SOLN
INTRAMUSCULAR | Status: DC | PRN
  Administered 2016-05-06: 4 mg via INTRAVENOUS

## 2016-05-06 MED ORDER — ONDANSETRON HCL 4 MG/2ML IJ SOLN
INTRAMUSCULAR | Status: AC
  Filled 2016-05-06: qty 2

## 2016-05-06 MED ORDER — VANCOMYCIN HCL 500 MG IV SOLR
INTRAVENOUS | Status: DC | PRN
  Administered 2016-05-06: 500 mg via TOPICAL

## 2016-05-06 MED ORDER — CHLORHEXIDINE GLUCONATE 4 % EX LIQD: 60.0000 mL | Freq: Once | CUTANEOUS | Status: DC

## 2016-05-06 MED ORDER — FENTANYL CITRATE (PF) 100 MCG/2ML IJ SOLN
INTRAMUSCULAR | Status: AC
  Filled 2016-05-06: qty 2

## 2016-05-06 MED ORDER — PROPOFOL 500 MG/50ML IV EMUL
INTRAVENOUS | Status: DC | PRN
  Administered 2016-05-06: 75 ug/kg/min via INTRAVENOUS

## 2016-05-06 MED ORDER — EPHEDRINE SULFATE 50 MG/ML IJ SOLN
INTRAMUSCULAR | Status: DC | PRN
  Administered 2016-05-06: 10 mg via INTRAVENOUS

## 2016-05-06 MED ORDER — VANCOMYCIN HCL 500 MG IV SOLR
INTRAVENOUS | Status: AC
  Filled 2016-05-06: qty 500

## 2016-05-06 MED ORDER — SENNA 8.6 MG PO TABS: 2.0000 | ORAL_TABLET | Freq: Two times a day (BID) | ORAL | Status: DC

## 2016-05-06 MED ORDER — ONDANSETRON HCL 4 MG/2ML IJ SOLN: 4.0000 mg | Freq: Once | INTRAMUSCULAR | Status: DC | PRN

## 2016-05-06 MED ORDER — FENTANYL CITRATE (PF) 100 MCG/2ML IJ SOLN
50.0000 ug | INTRAMUSCULAR | Status: AC | PRN
  Administered 2016-05-06 (×2): 50 ug via INTRAVENOUS
  Administered 2016-05-06: 25 ug via INTRAVENOUS

## 2016-05-06 MED ORDER — SCOPOLAMINE 1 MG/3DAYS TD PT72: 1.0000 | MEDICATED_PATCH | Freq: Once | TRANSDERMAL | Status: DC | PRN

## 2016-05-06 MED ORDER — EPHEDRINE 5 MG/ML INJ
INTRAVENOUS | Status: AC
  Filled 2016-05-06: qty 10

## 2016-05-06 MED ORDER — GLYCOPYRROLATE 0.2 MG/ML IJ SOLN: 0.2000 mg | Freq: Once | INTRAMUSCULAR | Status: DC | PRN

## 2016-05-06 MED ORDER — BUPIVACAINE-EPINEPHRINE (PF) 0.5% -1:200000 IJ SOLN
INTRAMUSCULAR | Status: DC | PRN
Start: 2016-05-06 — End: 2016-05-06
  Administered 2016-05-06: 40 mL via PERINEURAL

## 2016-05-06 MED ORDER — 0.9 % SODIUM CHLORIDE (POUR BTL) OPTIME
TOPICAL | Status: DC | PRN
  Administered 2016-05-06: 150 mL

## 2016-05-06 MED ORDER — LIDOCAINE 2% (20 MG/ML) 5 ML SYRINGE
INTRAMUSCULAR | Status: AC
  Filled 2016-05-06: qty 5

## 2016-05-06 MED ORDER — MIDAZOLAM HCL 2 MG/2ML IJ SOLN
INTRAMUSCULAR | Status: AC
  Filled 2016-05-06: qty 2

## 2016-05-06 MED ORDER — CEFAZOLIN SODIUM-DEXTROSE 2-4 GM/100ML-% IV SOLN
2.0000 g | INTRAVENOUS | Status: AC
  Administered 2016-05-06: 2 g via INTRAVENOUS

## 2016-05-06 MED ORDER — LIDOCAINE HCL (CARDIAC) 20 MG/ML IV SOLN: INTRAVENOUS | Status: DC | PRN

## 2016-05-06 MED ORDER — OXYCODONE HCL 5 MG PO TABS: 5.0000 mg | ORAL_TABLET | ORAL | Status: DC | PRN

## 2016-05-06 MED ORDER — BUPIVACAINE-EPINEPHRINE (PF) 0.5% -1:200000 IJ SOLN
INTRAMUSCULAR | Status: AC
  Filled 2016-05-06: qty 30

## 2016-05-06 MED ORDER — PROPOFOL 10 MG/ML IV BOLUS
INTRAVENOUS | Status: AC
  Filled 2016-05-06: qty 20

## 2016-05-06 MED ORDER — LACTATED RINGERS IV SOLN
INTRAVENOUS | Status: DC
  Administered 2016-05-06: 12:00:00 via INTRAVENOUS

## 2016-05-06 SURGICAL SUPPLY — 77 items
BANDAGE ESMARK 6X9 LF (GAUZE/BANDAGES/DRESSINGS) ×2 IMPLANT
BLADE AVERAGE 25MMX9MM (BLADE) ×1
BLADE AVERAGE 25X9 (BLADE) ×3 IMPLANT
BLADE MICRO SAGITTAL (BLADE) IMPLANT
BLADE MINI RND TIP GREEN BEAV (BLADE) IMPLANT
BLADE OSC/SAG .038X5.5 CUT EDG (BLADE) IMPLANT
BLADE SURG 15 STRL LF DISP TIS (BLADE) ×4 IMPLANT
BLADE SURG 15 STRL SS (BLADE) ×6
BNDG CMPR 9X4 STRL LF SNTH (GAUZE/BANDAGES/DRESSINGS)
BNDG CMPR 9X6 STRL LF SNTH (GAUZE/BANDAGES/DRESSINGS) ×1
BNDG COHESIVE 4X5 TAN STRL (GAUZE/BANDAGES/DRESSINGS) ×4 IMPLANT
BNDG COHESIVE 6X5 TAN STRL LF (GAUZE/BANDAGES/DRESSINGS) ×4 IMPLANT
BNDG CONFORM 2 STRL LF (GAUZE/BANDAGES/DRESSINGS) IMPLANT
BNDG CONFORM 3 STRL LF (GAUZE/BANDAGES/DRESSINGS) ×4 IMPLANT
BNDG ESMARK 4X9 LF (GAUZE/BANDAGES/DRESSINGS) IMPLANT
BNDG ESMARK 6X9 LF (GAUZE/BANDAGES/DRESSINGS) ×4
CHLORAPREP W/TINT 26ML (MISCELLANEOUS) ×4 IMPLANT
COVER BACK TABLE 60X90IN (DRAPES) ×4 IMPLANT
CUFF TOURNIQUET SINGLE 24IN (TOURNIQUET CUFF) IMPLANT
CUFF TOURNIQUET SINGLE 34IN LL (TOURNIQUET CUFF) ×4 IMPLANT
CUFF TOURNIQUET SINGLE 44IN (TOURNIQUET CUFF) IMPLANT
DRAPE EXTREMITY T 121X128X90 (DRAPE) ×4 IMPLANT
DRAPE OEC MINIVIEW 54X84 (DRAPES) ×4 IMPLANT
DRAPE U-SHAPE 47X51 STRL (DRAPES) ×4 IMPLANT
DRSG MEPITEL 4X7.2 (GAUZE/BANDAGES/DRESSINGS) ×4 IMPLANT
DRSG PAD ABDOMINAL 8X10 ST (GAUZE/BANDAGES/DRESSINGS) ×8 IMPLANT
ELECT REM PT RETURN 9FT ADLT (ELECTROSURGICAL) ×4
ELECTRODE REM PT RTRN 9FT ADLT (ELECTROSURGICAL) ×2 IMPLANT
GAUZE SPONGE 4X4 12PLY STRL (GAUZE/BANDAGES/DRESSINGS) ×4 IMPLANT
GLOVE BIO SURGEON STRL SZ8 (GLOVE) ×4 IMPLANT
GLOVE BIOGEL PI IND STRL 7.0 (GLOVE) ×2 IMPLANT
GLOVE BIOGEL PI IND STRL 8 (GLOVE) ×4 IMPLANT
GLOVE BIOGEL PI INDICATOR 7.0 (GLOVE) ×2
GLOVE BIOGEL PI INDICATOR 8 (GLOVE) ×4
GLOVE ECLIPSE 6.5 STRL STRAW (GLOVE) ×4 IMPLANT
GLOVE ECLIPSE 7.5 STRL STRAW (GLOVE) ×4 IMPLANT
GLOVE EXAM NITRILE MD LF STRL (GLOVE) IMPLANT
GOWN STRL REUS W/ TWL LRG LVL3 (GOWN DISPOSABLE) ×2 IMPLANT
GOWN STRL REUS W/ TWL XL LVL3 (GOWN DISPOSABLE) ×4 IMPLANT
GOWN STRL REUS W/TWL LRG LVL3 (GOWN DISPOSABLE) ×4
GOWN STRL REUS W/TWL XL LVL3 (GOWN DISPOSABLE) ×6
IMPL MTP CARTIVA 10MM (Orthopedic Implant) ×2 IMPLANT
IMPLANT MTP CARTIVA 10MM (Orthopedic Implant) ×4 IMPLANT
NEEDLE HYPO 22GX1.5 SAFETY (NEEDLE) IMPLANT
NEEDLE HYPO 25X1 1.5 SAFETY (NEEDLE) IMPLANT
NS IRRIG 1000ML POUR BTL (IV SOLUTION) ×4 IMPLANT
PACK BASIN DAY SURGERY FS (CUSTOM PROCEDURE TRAY) ×4 IMPLANT
PAD CAST 4YDX4 CTTN HI CHSV (CAST SUPPLIES) ×2 IMPLANT
PADDING CAST ABS 4INX4YD NS (CAST SUPPLIES)
PADDING CAST ABS COTTON 4X4 ST (CAST SUPPLIES) IMPLANT
PADDING CAST COTTON 4X4 STRL (CAST SUPPLIES) ×4
PADDING CAST COTTON 6X4 STRL (CAST SUPPLIES) ×4 IMPLANT
PENCIL BUTTON HOLSTER BLD 10FT (ELECTRODE) ×4 IMPLANT
SANITIZER HAND PURELL 535ML FO (MISCELLANEOUS) ×4 IMPLANT
SHEET MEDIUM DRAPE 40X70 STRL (DRAPES) ×4 IMPLANT
SLEEVE SCD COMPRESS KNEE MED (MISCELLANEOUS) ×4 IMPLANT
SPLINT FAST PLASTER 5X30 (CAST SUPPLIES) ×40
SPLINT PLASTER CAST FAST 5X30 (CAST SUPPLIES) ×40 IMPLANT
SPONGE LAP 18X18 X RAY DECT (DISPOSABLE) ×4 IMPLANT
SPONGE SURGIFOAM ABS GEL 12-7 (HEMOSTASIS) IMPLANT
STOCKINETTE 6  STRL (DRAPES) ×2
STOCKINETTE 6 STRL (DRAPES) ×2 IMPLANT
SUCTION FRAZIER HANDLE 10FR (MISCELLANEOUS) ×2
SUCTION TUBE FRAZIER 10FR DISP (MISCELLANEOUS) ×2 IMPLANT
SUT ETHILON 3 0 PS 1 (SUTURE) ×4 IMPLANT
SUT ETHILON 4 0 PS 2 18 (SUTURE) IMPLANT
SUT MNCRL AB 3-0 PS2 18 (SUTURE) ×4 IMPLANT
SUT VIC AB 0 SH 27 (SUTURE) IMPLANT
SUT VIC AB 2-0 SH 27 (SUTURE) ×4
SUT VIC AB 2-0 SH 27XBRD (SUTURE) ×2 IMPLANT
SYR BULB 3OZ (MISCELLANEOUS) ×4 IMPLANT
SYR CONTROL 10ML LL (SYRINGE) IMPLANT
TOWEL OR 17X24 6PK STRL BLUE (TOWEL DISPOSABLE) ×8 IMPLANT
TUBE CONNECTING 20'X1/4 (TUBING) ×1
TUBE CONNECTING 20X1/4 (TUBING) ×3 IMPLANT
UNDERPAD 30X30 (UNDERPADS AND DIAPERS) ×4 IMPLANT
YANKAUER SUCT BULB TIP NO VENT (SUCTIONS) IMPLANT

## 2016-05-06 NOTE — Transfer of Care (Signed)
Immediate Anesthesia Transfer of Care Note  Patient: Trevor Grimes  Procedure(s) Performed: Procedure(s): LEFT HALLUX METATARSAL PHALANGEAL JOINT CHEILECTOMY AND CARTIVA  RESURFACING (Left)  Patient Location: PACU  Anesthesia Type:MAC combined with regional for post-op pain  Level of Consciousness: awake, oriented and patient cooperative  Airway & Oxygen Therapy: Patient Spontanous Breathing and Patient connected to face mask oxygen  Post-op Assessment: Report given to RN and Post -op Vital signs reviewed and stable  Post vital signs: Reviewed and stable  Last Vitals:  Filed Vitals:   05/06/16 1105 05/06/16 1110  BP:    Pulse: 55 52  Resp: 10 15    Last Pain:  Filed Vitals:   05/06/16 1121  PainSc: 4          Complications: No apparent anesthesia complications

## 2016-05-06 NOTE — Brief Op Note (Signed)
05/06/2016  12:45 PM  PATIENT:  Trevor Grimes  65 y.o. male  PRE-OPERATIVE DIAGNOSIS:  LEFT HALLUX RIGIDUS  POST-OPERATIVE DIAGNOSIS:  LEFT HALLUX RIGIDUS  Procedure(s): LEFT HALLUX METATARSAL PHALANGEAL JOINT CHEILECTOMY AND CARTIVA  RESURFACING  SURGEON:  Wylene Simmer, MD  ASSISTANT: Mechele Claude, PA-C  ANESTHESIA:   Regional, MAC  EBL:  minimal   TOURNIQUET:   Total Tourniquet Time Documented: Calf (Left) - 24 minutes Total: Calf (Left) - 24 minutes  COMPLICATIONS:  None apparent  DISPOSITION:  Extubated, awake and stable to recovery.  DICTATION ID:  [id number]

## 2016-05-06 NOTE — Anesthesia Postprocedure Evaluation (Signed)
Anesthesia Post Note  Patient: Trevor Grimes  Procedure(s) Performed: Procedure(s) (LRB): LEFT HALLUX METATARSAL PHALANGEAL JOINT CHEILECTOMY AND CARTIVA  RESURFACING (Left)  Patient location during evaluation: PACU Anesthesia Type: MAC and Regional Level of consciousness: awake and alert Pain management: pain level controlled Vital Signs Assessment: post-procedure vital signs reviewed and stable Respiratory status: spontaneous breathing, nonlabored ventilation, respiratory function stable and patient connected to nasal cannula oxygen Cardiovascular status: stable and blood pressure returned to baseline Postop Assessment: no headache and no backache Anesthetic complications: no    Last Vitals:  Filed Vitals:   05/06/16 1315 05/06/16 1328  BP:  145/76  Pulse: 59 77  Temp:  36.5 C  Resp: 13 20    Last Pain:  Filed Vitals:   05/06/16 1332  PainSc: 0-No pain                 Catalina Gravel

## 2016-05-06 NOTE — Progress Notes (Signed)
Assisted Dr. Turk with left, ultrasound guided, popliteal/saphenous block. Side rails up, monitors on throughout procedure. See vital signs in flow sheet. Tolerated Procedure well. 

## 2016-05-06 NOTE — Anesthesia Preprocedure Evaluation (Addendum)
Anesthesia Evaluation  Patient identified by MRN, date of birth, ID band Patient awake    Reviewed: Allergy & Precautions, NPO status , Patient's Chart, lab work & pertinent test results  History of Anesthesia Complications Negative for: history of anesthetic complications  Airway Mallampati: II  TM Distance: >3 FB Neck ROM: Full    Dental  (+) Teeth Intact, Dental Advisory Given   Pulmonary sleep apnea and Continuous Positive Airway Pressure Ventilation ,    Pulmonary exam normal breath sounds clear to auscultation       Cardiovascular Exercise Tolerance: Good negative cardio ROS Normal cardiovascular exam Rhythm:Regular Rate:Normal     Neuro/Psych PSYCHIATRIC DISORDERS Anxiety Depression negative neurological ROS     GI/Hepatic negative GI ROS, Neg liver ROS,   Endo/Other  diabetes, Type 2, Oral Hypoglycemic AgentsHypothyroidism   Renal/GU negative Renal ROS     Musculoskeletal  (+) Arthritis , Osteoarthritis,    Abdominal   Peds  Hematology negative hematology ROS (+)   Anesthesia Other Findings Day of surgery medications reviewed with the patient.  Reproductive/Obstetrics                          Anesthesia Physical Anesthesia Plan  ASA: II  Anesthesia Plan: Regional and MAC   Post-op Pain Management:    Induction: Intravenous  Airway Management Planned: Nasal Cannula  Additional Equipment:   Intra-op Plan:   Post-operative Plan:   Informed Consent: I have reviewed the patients History and Physical, chart, labs and discussed the procedure including the risks, benefits and alternatives for the proposed anesthesia with the patient or authorized representative who has indicated his/her understanding and acceptance.   Dental advisory given  Plan Discussed with:   Anesthesia Plan Comments: (Risks/benefits of regional block discussed with patient including risk of bleeding,  infection, nerve damage, and possibility of failed block.  Also discussed backup plan of general anesthesia and associated risks.  Patient wishes to proceed.)        Anesthesia Quick Evaluation

## 2016-05-06 NOTE — Discharge Instructions (Addendum)
Trevor Simmer, MD [place]  Please read the following information regarding your care after surgery.  Medications  You only need a prescription for the narcotic pain medicine (ex. oxycodone, Percocet, Norco).  All of the other medicines listed below are available over the counter. X acetominophen (Tylenol) 650 mg every 4-6 hours as you need for minor pain X oxycodone as prescribed for moderate to severe pain ?   Narcotic pain medicine (ex. oxycodone, Percocet, Vicodin) will cause constipation.  To prevent this problem, take the following medicines while you are taking any pain medicine. X docusate sodium (Colace) 100 mg twice a day X senna (Senokot) 2 tablets twice a day   Weight Bearing X Bear weight when you are able on your operated leg or foot in your flat post-op shoe.  Dressing X Keep your dressing clean and dry.  Dont put anything (coat hanger, pencil, etc) down inside of it.  If it gets damp, use a hair dryer on the cool setting to dry it.  If it gets soaked, call the office to schedule an appointment for a cast change.  After your dressing, cast or splint is removed; you may shower, but do not soak or scrub the wound.  Allow the water to run over it, and then gently pat it dry.  Swelling It is normal for you to have swelling where you had surgery.  To reduce swelling and pain, keep your toes above your nose for at least 3 days after surgery.  It may be necessary to keep your foot or leg elevated for several weeks.  If it hurts, it should be elevated.  Follow Up Call my office at [phone] when you are discharged from the hospital or surgery center to schedule an appointment to be seen two weeks after surgery.  Call my office at [phone] if you develop a fever >101.5 F, nausea, vomiting, bleeding from the surgical site or severe pain.     Post Anesthesia Home Care Instructions  Activity: Get plenty of rest for the remainder of the day. A responsible  adult should stay with you for 24 hours following the procedure.  For the next 24 hours, DO NOT: -Drive a car -Paediatric nurse -Drink alcoholic beverages -Take any medication unless instructed by your physician -Make any legal decisions or sign important papers.  Meals: Start with liquid foods such as gelatin or soup. Progress to regular foods as tolerated. Avoid greasy, spicy, heavy foods. If nausea and/or vomiting occur, drink only clear liquids until the nausea and/or vomiting subsides. Call your physician if vomiting continues.  Special Instructions/Symptoms: Your throat may feel dry or sore from the anesthesia or the breathing tube placed in your throat during surgery. If this causes discomfort, gargle with warm salt water. The discomfort should disappear within 24 hours.  If you had a scopolamine patch placed behind your ear for the management of post- operative nausea and/or vomiting:  1. The medication in the patch is effective for 72 hours, after which it should be removed.  Wrap patch in a tissue and discard in the trash. Wash hands thoroughly with soap and water. 2. You may remove the patch earlier than 72 hours if you experience unpleasant side effects which may include dry mouth, dizziness or visual disturbances. 3. Avoid touching the patch. Wash your hands with soap and water after contact with the patch.     Regional Anesthesia Blocks  1. Numbness or the inability to move the "blocked" extremity may last from  3-48 hours after placement. The length of time depends on the medication injected and your individual response to the medication. If the numbness is not going away after 48 hours, call your surgeon.  2. The extremity that is blocked will need to be protected until the numbness is gone and the  Strength has returned. Because you cannot feel it, you will need to take extra care to avoid injury. Because it may be weak, you may have difficulty moving it or using it. You  may not know what position it is in without looking at it while the block is in effect.  3. For blocks in the legs and feet, returning to weight bearing and walking needs to be done carefully. You will need to wait until the numbness is entirely gone and the strength has returned. You should be able to move your leg and foot normally before you try and bear weight or walk. You will need someone to be with you when you first try to ensure you do not fall and possibly risk injury.  4. Bruising and tenderness at the needle site are common side effects and will resolve in a few days.  5. Persistent numbness or new problems with movement should be communicated to the surgeon or the [place] [phone] [place] [phone]).

## 2016-05-06 NOTE — H&P (Signed)
Trevor Grimes is an 65 y.o. male.   Chief Complaint: left hallux rigidus HPI: 65 y/o male with PMH of diabetes c/o L forefoot pain worsening over the last year.  He has signs and symptoms of hallux rigidus and has failed non op treatment.  He presents now for left hallux MPJ cheilectomy and joint resurfacing.  Past Medical History  Diagnosis Date  . Thyroid disease   . Anxiety   . Depression   . Diabetes mellitus without complication ([place])   . Kidney stones     hx of  . Arthritis   . Chronic back pain   . Hypothyroidism   . Sleep apnea     Has CPAP machine, does not wear every night    Past Surgical History  Procedure Laterality Date  . Back surgery    . Knee surgery    . Amputation Right 01/25/2013    Procedure: RIGHT 1ST RAY AMPUTATION ;  Surgeon: Wylene Simmer, MD;  Location: [facility];  Service: Orthopedics;  Laterality: Right;  . I&d extremity Right 01/25/2013    Procedure: IRRIGATION AND DEBRIDEMENT Right Hallux;  Surgeon: Wylene Simmer, MD;  Location: [facility];  Service: Orthopedics;  Laterality: Right;  . Cervical fusion      History reviewed. No pertinent family history. Social History:  reports that he has never smoked. He does not have any smokeless tobacco history on file. He reports that he drinks alcohol. He reports that he does not use illicit drugs.  Allergies: No Known Allergies  Medications Prior to Admission  Medication Sig Dispense Refill  . ACCU-CHEK SMARTVIEW test strip     . glyBURIDE (DIABETA) 2.5 MG tablet Take 2.5 mg by mouth 2 (two) times daily with a meal.    . HYDROcodone-acetaminophen (NORCO/VICODIN) 5-325 MG per tablet Take 1-2 tablets by mouth every 6 (six) hours as needed for pain. 30 tablet 0  . levothyroxine (SYNTHROID, LEVOTHROID) 25 MCG tablet Take 25 mcg by mouth daily.    . metFORMIN (GLUCOPHAGE) 1000 MG tablet Take 1,000 mg by mouth daily with breakfast.    . sertraline (ZOLOFT) 50 MG tablet Take 50 mg by mouth daily.    . Multiple Vitamin  (MULTIVITAMIN WITH MINERALS) TABS Take 1 tablet by mouth daily.      Results for orders placed or performed during the hospital encounter of 05-26-16 (from the past 48 hour(s))  Glucose, capillary     Status: Abnormal   Collection Time: May 26, 2016 10:14 AM  Result Value Ref Range   Glucose-Capillary 138 (H) 65 - 99 mg/dL   No results found.  ROS  No recent f/c/n/v/wt loss  Blood pressure 115/68, pulse 52, resp. rate 15, height 5\' 10"  (1.778 m), weight 88.905 kg (196 lb), SpO2 100 %. Physical Exam  wn wd male in nad.  A and O x 4.  Mood and affect normal.  EOMI.  resp unlabored.  L foot with decreased ROM at the hallux MPJ.  No lymphadenopathy.  5/5 strength in PF adn DF of the toes.  Healthy and intact skin.  Sens to LT intact at the forefoot.  Assessment/Plan L hallux rigidus - to OR for L hallus MPJ cheilectomy and joint resurfacing.  The risks and benefits of the alternative treatment options have been discussed in detail.  The patient wishes to proceed with surgery and specifically understands risks of bleeding, infection, nerve damage, blood clots, need for additional surgery, amputation and death.   Wylene Simmer, MD 05-26-2016, 11:36 AM

## 2016-05-06 NOTE — Anesthesia Procedure Notes (Addendum)
Anesthesia Regional Block:  Popliteal block  Pre-Anesthetic Checklist: ,, timeout performed, Correct Patient, Correct Site, Correct Laterality, Correct Procedure, Correct Position, site marked, Risks and benefits discussed,  Surgical consent,  Pre-op evaluation,  At surgeon's request and post-op pain management  Laterality: Left  Prep: chloraprep       Needles:  Injection technique: Single-shot  Needle Type: Echogenic Needle     Needle Length: 9cm 9 cm Needle Gauge: 21 and 21 G    Additional Needles:  Procedures: ultrasound guided (picture in chart) Popliteal block Narrative:  Injection made incrementally with aspirations every 5 mL.  Performed by: Personally  Anesthesiologist: Catalina Gravel  Additional Notes: No pain on injection. No increased resistance to injection. Injection made in 5cc increments.  Good needle visualization.  Patient tolerated procedure well.  Left popliteal/saphenous nerve block.

## 2016-05-07 ENCOUNTER — Encounter (HOSPITAL_BASED_OUTPATIENT_CLINIC_OR_DEPARTMENT_OTHER): Payer: Self-pay | Admitting: Orthopedic Surgery

## 2016-05-07 NOTE — Op Note (Signed)
NAMEEinar Grimes NO.:  [id number]  MEDICAL RECORD NO.:  [id number]  LOCATION:                                 FACILITY:  PHYSICIAN:  Wylene Simmer, MD             DATE OF BIRTH:  DATE OF PROCEDURE:  05/06/2016 DATE OF DISCHARGE:                              OPERATIVE REPORT   PREOPERATIVE DIAGNOSIS:  Left hallux rigidus.  POSTOPERATIVE DIAGNOSIS:  Left hallux rigidus.  PROCEDURE:  Left hallux metacarpophalangeal joint cheilectomy and joint resurfacing (Cartiva).  SURGEON:  Wylene Simmer, MD.  ASSISTANT:  Mechele Claude, PA-C.  ANESTHESIA:  Regional, MAC.  ESTIMATED BLOOD LOSS:  Minimal.  TOURNIQUET TIME:  24 minutes at 250 mmHg.  COMPLICATIONS:  None apparent.  DISPOSITION:  Extubated, awake, and stable to recovery.  INDICATION FOR PROCEDURE:  The patient is a 65 year old male, who has a history of worsening left forefoot pain over the last year.  Signs and symptoms consistent with hallux rigidus.  He has failed nonoperative treatment to date including activity modification, oral anti- inflammatories, and shoe wear modification.  He presents today for hallux MP joint cheilectomy and resurfacing with a Cartiva implant.  He understands the risks and benefits, the alternative treatment options, and elects surgical treatment.  He specifically understands risks of bleeding, infection, nerve damage, blood clots, need for additional surgery, continued pain, nonunion, amputation, and death.  PROCEDURE IN DETAIL:  After preoperative consent was obtained and the correct operative site was identified, the patient was brought to the operating room and placed supine on the operating table.  General anesthesia was induced.  Preoperative antibiotics were administered. Surgical time-out was taken.  Left lower extremity was prepped and draped in standard sterile fashion with tourniquet around the calf. Tourniquet was inflated to 250 mmHg.  A longitudinal incision  was made over the hallux MP joint.  Sharp dissection was carried down through the skin and subcutaneous tissue.  A dorsal joint capsule was incised and elevated medially and laterally.  The collateral ligaments were released.  Osteophytes were removed circumferentially with a rongeur. There was full-thickness cartilage loss over 75% of the metatarsal head and over the dorsal 25% of the base of the proximal phalanx.  The sesamoid articulations were mobilized with a Firefighter.  A __________ implant was then inserted in the central portion of the metatarsal head just dorsal to the midpoint.  The K-wire was inserted and was hinged until it was about a millimeter away from the bone.  The bone fragments were all removed.  The wound was irrigated copiously. The guidewire was removed.  The Cartiva implant was then seated to its max depth.  The joint was reduced.  __________.  Wound was irrigated copiously.  Vancomycin powder was sprinkled into the wound.  The joint capsule was repaired with simple sutures.  Subcutaneous tissues were approximated with simple sutures of Monocryl __________.  Tourniquet was released after application of the dressings at 24 minutes.  The patient was awakened from anesthesia and transported to the recovery room in stable condition.  FOLLOWUP PLAN:  The patient will be weightbearing as tolerated on the left lower  extremity in a flat postop shoe.  He will follow up with me in the office in 2 weeks for suture removal.  Mechele Claude, PA-C, was present and scrubbed for the duration of the case.  His assistance was essential in positioning the patient, prepping and draping, gaining and maintaining exposure, performing the operation, closing and dressing the wounds.     Wylene Simmer, MD     JH/MEDQ  D:  05/06/2016  T:  05/06/2016  Job:  [id number]

## 2016-05-11 DIAGNOSIS — M170 Bilateral primary osteoarthritis of knee: Secondary | ICD-10-CM | POA: Diagnosis not present

## 2016-05-11 DIAGNOSIS — Z6828 Body mass index (BMI) 28.0-28.9, adult: Secondary | ICD-10-CM | POA: Diagnosis not present

## 2016-05-11 DIAGNOSIS — Z981 Arthrodesis status: Secondary | ICD-10-CM | POA: Diagnosis not present

## 2016-05-11 DIAGNOSIS — M25561 Pain in right knee: Secondary | ICD-10-CM | POA: Diagnosis not present

## 2016-05-11 DIAGNOSIS — M1711 Unilateral primary osteoarthritis, right knee: Secondary | ICD-10-CM | POA: Diagnosis not present

## 2016-05-11 DIAGNOSIS — M4722 Other spondylosis with radiculopathy, cervical region: Secondary | ICD-10-CM | POA: Diagnosis not present

## 2016-05-11 DIAGNOSIS — M5030 Other cervical disc degeneration, unspecified cervical region: Secondary | ICD-10-CM | POA: Diagnosis not present

## 2016-05-11 DIAGNOSIS — M25562 Pain in left knee: Secondary | ICD-10-CM | POA: Diagnosis not present

## 2016-05-19 DIAGNOSIS — Z4789 Encounter for other orthopedic aftercare: Secondary | ICD-10-CM | POA: Diagnosis not present

## 2016-05-19 DIAGNOSIS — M2022 Hallux rigidus, left foot: Secondary | ICD-10-CM | POA: Diagnosis not present

## 2016-06-01 DIAGNOSIS — M2022 Hallux rigidus, left foot: Secondary | ICD-10-CM | POA: Diagnosis not present

## 2016-06-02 DIAGNOSIS — S8290XD Unspecified fracture of unspecified lower leg, subsequent encounter for closed fracture with routine healing: Secondary | ICD-10-CM | POA: Diagnosis not present

## 2016-06-07 DIAGNOSIS — Z4789 Encounter for other orthopedic aftercare: Secondary | ICD-10-CM | POA: Diagnosis not present

## 2016-06-08 DIAGNOSIS — M2022 Hallux rigidus, left foot: Secondary | ICD-10-CM | POA: Diagnosis not present

## 2016-06-15 DIAGNOSIS — M170 Bilateral primary osteoarthritis of knee: Secondary | ICD-10-CM | POA: Diagnosis not present

## 2016-06-15 DIAGNOSIS — M25562 Pain in left knee: Secondary | ICD-10-CM | POA: Diagnosis not present

## 2016-06-15 DIAGNOSIS — M25561 Pain in right knee: Secondary | ICD-10-CM | POA: Diagnosis not present

## 2016-06-15 DIAGNOSIS — R262 Difficulty in walking, not elsewhere classified: Secondary | ICD-10-CM | POA: Diagnosis not present

## 2016-06-19 DIAGNOSIS — R42 Dizziness and giddiness: Secondary | ICD-10-CM | POA: Diagnosis not present

## 2016-06-30 DIAGNOSIS — Z4789 Encounter for other orthopedic aftercare: Secondary | ICD-10-CM | POA: Diagnosis not present

## 2016-06-30 DIAGNOSIS — M170 Bilateral primary osteoarthritis of knee: Secondary | ICD-10-CM | POA: Diagnosis not present

## 2016-06-30 DIAGNOSIS — M25561 Pain in right knee: Secondary | ICD-10-CM | POA: Diagnosis not present

## 2016-06-30 DIAGNOSIS — M25562 Pain in left knee: Secondary | ICD-10-CM | POA: Diagnosis not present

## 2016-07-05 DIAGNOSIS — M1711 Unilateral primary osteoarthritis, right knee: Secondary | ICD-10-CM | POA: Diagnosis not present

## 2016-07-05 DIAGNOSIS — M25561 Pain in right knee: Secondary | ICD-10-CM | POA: Diagnosis not present

## 2016-07-08 DIAGNOSIS — M1712 Unilateral primary osteoarthritis, left knee: Secondary | ICD-10-CM | POA: Diagnosis not present

## 2016-07-08 DIAGNOSIS — M25562 Pain in left knee: Secondary | ICD-10-CM | POA: Diagnosis not present

## 2016-07-12 DIAGNOSIS — M1711 Unilateral primary osteoarthritis, right knee: Secondary | ICD-10-CM | POA: Diagnosis not present

## 2016-07-12 DIAGNOSIS — M25561 Pain in right knee: Secondary | ICD-10-CM | POA: Diagnosis not present

## 2016-07-14 DIAGNOSIS — M25562 Pain in left knee: Secondary | ICD-10-CM | POA: Diagnosis not present

## 2016-07-14 DIAGNOSIS — M1712 Unilateral primary osteoarthritis, left knee: Secondary | ICD-10-CM | POA: Diagnosis not present

## 2016-07-19 DIAGNOSIS — M1711 Unilateral primary osteoarthritis, right knee: Secondary | ICD-10-CM | POA: Diagnosis not present

## 2016-07-19 DIAGNOSIS — M25561 Pain in right knee: Secondary | ICD-10-CM | POA: Diagnosis not present

## 2016-07-20 DIAGNOSIS — D225 Melanocytic nevi of trunk: Secondary | ICD-10-CM | POA: Diagnosis not present

## 2016-07-20 DIAGNOSIS — B081 Molluscum contagiosum: Secondary | ICD-10-CM | POA: Diagnosis not present

## 2016-07-20 DIAGNOSIS — L814 Other melanin hyperpigmentation: Secondary | ICD-10-CM | POA: Diagnosis not present

## 2016-07-20 DIAGNOSIS — L570 Actinic keratosis: Secondary | ICD-10-CM | POA: Diagnosis not present

## 2016-07-20 DIAGNOSIS — D1801 Hemangioma of skin and subcutaneous tissue: Secondary | ICD-10-CM | POA: Diagnosis not present

## 2016-07-20 DIAGNOSIS — L821 Other seborrheic keratosis: Secondary | ICD-10-CM | POA: Diagnosis not present

## 2016-07-21 DIAGNOSIS — M25562 Pain in left knee: Secondary | ICD-10-CM | POA: Diagnosis not present

## 2016-07-21 DIAGNOSIS — M1712 Unilateral primary osteoarthritis, left knee: Secondary | ICD-10-CM | POA: Diagnosis not present

## 2016-07-27 DIAGNOSIS — M25562 Pain in left knee: Secondary | ICD-10-CM | POA: Diagnosis not present

## 2016-07-27 DIAGNOSIS — M25561 Pain in right knee: Secondary | ICD-10-CM | POA: Diagnosis not present

## 2016-07-27 DIAGNOSIS — M170 Bilateral primary osteoarthritis of knee: Secondary | ICD-10-CM | POA: Diagnosis not present

## 2016-07-28 DIAGNOSIS — Z4789 Encounter for other orthopedic aftercare: Secondary | ICD-10-CM | POA: Diagnosis not present

## 2016-07-28 DIAGNOSIS — M2022 Hallux rigidus, left foot: Secondary | ICD-10-CM | POA: Diagnosis not present

## 2016-08-09 DIAGNOSIS — M5136 Other intervertebral disc degeneration, lumbar region: Secondary | ICD-10-CM | POA: Diagnosis not present

## 2016-08-09 DIAGNOSIS — M47816 Spondylosis without myelopathy or radiculopathy, lumbar region: Secondary | ICD-10-CM | POA: Diagnosis not present

## 2016-08-09 DIAGNOSIS — M545 Low back pain: Secondary | ICD-10-CM | POA: Diagnosis not present

## 2016-08-09 DIAGNOSIS — M546 Pain in thoracic spine: Secondary | ICD-10-CM | POA: Diagnosis not present

## 2016-08-09 DIAGNOSIS — M549 Dorsalgia, unspecified: Secondary | ICD-10-CM | POA: Diagnosis not present

## 2016-08-09 DIAGNOSIS — M4155 Other secondary scoliosis, thoracolumbar region: Secondary | ICD-10-CM | POA: Diagnosis not present

## 2016-08-09 DIAGNOSIS — Z6828 Body mass index (BMI) 28.0-28.9, adult: Secondary | ICD-10-CM | POA: Diagnosis not present

## 2016-08-09 DIAGNOSIS — G8929 Other chronic pain: Secondary | ICD-10-CM | POA: Diagnosis not present

## 2016-08-11 DIAGNOSIS — M4726 Other spondylosis with radiculopathy, lumbar region: Secondary | ICD-10-CM | POA: Diagnosis not present

## 2016-08-11 DIAGNOSIS — M4806 Spinal stenosis, lumbar region: Secondary | ICD-10-CM | POA: Diagnosis not present

## 2016-08-11 DIAGNOSIS — M5136 Other intervertebral disc degeneration, lumbar region: Secondary | ICD-10-CM | POA: Diagnosis not present

## 2016-08-11 DIAGNOSIS — M5416 Radiculopathy, lumbar region: Secondary | ICD-10-CM | POA: Diagnosis not present

## 2016-09-07 DIAGNOSIS — Z981 Arthrodesis status: Secondary | ICD-10-CM | POA: Diagnosis not present

## 2016-09-07 DIAGNOSIS — M4722 Other spondylosis with radiculopathy, cervical region: Secondary | ICD-10-CM | POA: Diagnosis not present

## 2016-09-07 DIAGNOSIS — M25561 Pain in right knee: Secondary | ICD-10-CM | POA: Diagnosis not present

## 2016-09-07 DIAGNOSIS — S129XXA Fracture of neck, unspecified, initial encounter: Secondary | ICD-10-CM | POA: Diagnosis not present

## 2016-09-07 DIAGNOSIS — M5030 Other cervical disc degeneration, unspecified cervical region: Secondary | ICD-10-CM | POA: Diagnosis not present

## 2016-09-07 DIAGNOSIS — M25562 Pain in left knee: Secondary | ICD-10-CM | POA: Diagnosis not present

## 2016-09-15 DIAGNOSIS — M79641 Pain in right hand: Secondary | ICD-10-CM | POA: Diagnosis not present

## 2016-09-15 DIAGNOSIS — M19041 Primary osteoarthritis, right hand: Secondary | ICD-10-CM | POA: Diagnosis not present

## 2016-09-16 DIAGNOSIS — Z23 Encounter for immunization: Secondary | ICD-10-CM | POA: Diagnosis not present

## 2016-09-16 DIAGNOSIS — R252 Cramp and spasm: Secondary | ICD-10-CM | POA: Diagnosis not present

## 2016-09-16 DIAGNOSIS — E039 Hypothyroidism, unspecified: Secondary | ICD-10-CM | POA: Diagnosis not present

## 2016-09-16 DIAGNOSIS — E1165 Type 2 diabetes mellitus with hyperglycemia: Secondary | ICD-10-CM | POA: Diagnosis not present

## 2016-09-16 DIAGNOSIS — M519 Unspecified thoracic, thoracolumbar and lumbosacral intervertebral disc disorder: Secondary | ICD-10-CM | POA: Diagnosis not present

## 2016-09-16 DIAGNOSIS — M25569 Pain in unspecified knee: Secondary | ICD-10-CM | POA: Diagnosis not present

## 2016-09-16 DIAGNOSIS — Z8639 Personal history of other endocrine, nutritional and metabolic disease: Secondary | ICD-10-CM | POA: Diagnosis not present

## 2016-09-16 DIAGNOSIS — Z125 Encounter for screening for malignant neoplasm of prostate: Secondary | ICD-10-CM | POA: Diagnosis not present

## 2016-09-16 DIAGNOSIS — Z89411 Acquired absence of right great toe: Secondary | ICD-10-CM | POA: Diagnosis not present

## 2016-09-16 DIAGNOSIS — F339 Major depressive disorder, recurrent, unspecified: Secondary | ICD-10-CM | POA: Diagnosis not present

## 2016-09-16 DIAGNOSIS — Z0000 Encounter for general adult medical examination without abnormal findings: Secondary | ICD-10-CM | POA: Diagnosis not present

## 2016-09-16 DIAGNOSIS — E7800 Pure hypercholesterolemia, unspecified: Secondary | ICD-10-CM | POA: Diagnosis not present

## 2016-09-20 DIAGNOSIS — M2022 Hallux rigidus, left foot: Secondary | ICD-10-CM | POA: Diagnosis not present

## 2016-09-20 DIAGNOSIS — M2041 Other hammer toe(s) (acquired), right foot: Secondary | ICD-10-CM | POA: Diagnosis not present

## 2016-09-20 DIAGNOSIS — Z4789 Encounter for other orthopedic aftercare: Secondary | ICD-10-CM | POA: Diagnosis not present

## 2016-09-20 DIAGNOSIS — Z89411 Acquired absence of right great toe: Secondary | ICD-10-CM | POA: Diagnosis not present

## 2016-10-11 DIAGNOSIS — L570 Actinic keratosis: Secondary | ICD-10-CM | POA: Diagnosis not present

## 2016-10-18 ENCOUNTER — Encounter (HOSPITAL_COMMUNITY): Payer: Self-pay | Admitting: Emergency Medicine

## 2016-10-18 ENCOUNTER — Emergency Department (HOSPITAL_COMMUNITY)
Admission: EM | Admit: 2016-10-18 | Discharge: 2016-10-19 | Disposition: A | Discharge status: Home / Self Care | Payer: Medicare Other | Attending: Emergency Medicine | Admitting: Emergency Medicine

## 2016-10-18 DIAGNOSIS — E119 Type 2 diabetes mellitus without complications: Secondary | ICD-10-CM | POA: Insufficient documentation

## 2016-10-18 DIAGNOSIS — E860 Dehydration: Secondary | ICD-10-CM | POA: Insufficient documentation

## 2016-10-18 DIAGNOSIS — R404 Transient alteration of awareness: Secondary | ICD-10-CM | POA: Diagnosis not present

## 2016-10-18 DIAGNOSIS — R42 Dizziness and giddiness: Secondary | ICD-10-CM | POA: Insufficient documentation

## 2016-10-18 DIAGNOSIS — E039 Hypothyroidism, unspecified: Secondary | ICD-10-CM | POA: Insufficient documentation

## 2016-10-18 DIAGNOSIS — Z7984 Long term (current) use of oral hypoglycemic drugs: Secondary | ICD-10-CM | POA: Diagnosis not present

## 2016-10-18 MED ORDER — MECLIZINE HCL 25 MG PO TABS
25.0000 mg | ORAL_TABLET | Freq: Once | ORAL | Status: AC
Start: 2016-10-19 — End: 2016-10-19
  Administered 2016-10-19: 25 mg via ORAL
  Filled 2016-10-18: qty 1

## 2016-10-18 MED ORDER — SODIUM CHLORIDE 0.9 % IV BOLUS (SEPSIS)
1000.0000 mL | Freq: Once | INTRAVENOUS | Status: AC
  Administered 2016-10-19: 1000 mL via INTRAVENOUS

## 2016-10-18 MED ORDER — PROMETHAZINE HCL 25 MG/ML IJ SOLN
12.5000 mg | Freq: Once | INTRAMUSCULAR | Status: AC
  Administered 2016-10-19: 12.5 mg via INTRAVENOUS
  Filled 2016-10-18: qty 1

## 2016-10-18 NOTE — ED Triage Notes (Signed)
Per EMS, Pt from home. Pt reports having dizziness. No orthostatic changes or neurological deficits. Pt became nauseated and had 1 episode of vomiting. Denies chest pain and SHOB. CBG 171. BP 164/84, HR 70 RR 18, 97% O2 on RA.

## 2016-10-18 NOTE — ED Provider Notes (Signed)
[facility] DEPT Provider Note   CSN: [id number] Arrival date & time:     By signing my name below, I, Ephriam Jenkins, attest that this documentation has been prepared under the direction and in the presence of Merryl Hacker, MD. Electronically signed, Ephriam Jenkins, ED Scribe. 10/19/16. 12:03 AM.  History   Chief Complaint Chief Complaint  Patient presents with  . Dizziness    HPI HPI Comments: Trevor Grimes is a 65 y.o. male with Hx of NIDDM brought in by ambulance, who presents to the Emergency Department complaining of sudden onset dizziness with associated nausea and vomiting that started at 1800 this evening. Pt reports from home and states that he has been unable to stand up due to a feeling of "heaviness". He describes this as "feels like I am going to pass out", he has not had an episode of syncope this evening. Pt has been eating and drinking per normal this evening. Wife states that he has had similar symptoms when he has become hypoglycemic in the past. Wife checked pt's CBG at home and was 145. Pt has had one episode of nausea and vomiting this evening. Wife reports that pt had a recent possible ear infection 2 weeks ago. Pt denies any unilateral numbness or weakness. No chest pain or shortness of breath.  The history is provided by the patient and the spouse. No language interpreter was used.    Past Medical History:  Diagnosis Date  . Anxiety   . Arthritis   . Chronic back pain   . Depression   . Diabetes mellitus without complication ([place])   . Hypothyroidism   . Kidney stones    hx of  . Sleep apnea    Has CPAP machine, does not wear every night  . Thyroid disease     There are no active problems to display for this patient.   Past Surgical History:  Procedure Laterality Date  . AMPUTATION Right 01/25/2013   Procedure: RIGHT 1ST RAY AMPUTATION ;  Surgeon: Wylene Simmer, MD;  Location: [facility];  Service: Orthopedics;  Laterality: Right;  . BACK SURGERY    .  CERVICAL FUSION    . CHEILECTOMY Left 05/06/2016   Procedure: LEFT HALLUX METATARSAL PHALANGEAL JOINT CHEILECTOMY AND CARTIVA  RESURFACING;  Surgeon: Wylene Simmer, MD;  Location: [facility];  Service: Orthopedics;  Laterality: Left;  . I&D EXTREMITY Right 01/25/2013   Procedure: IRRIGATION AND DEBRIDEMENT Right Hallux;  Surgeon: Wylene Simmer, MD;  Location: [facility];  Service: Orthopedics;  Laterality: Right;  . KNEE SURGERY         Home Medications    Prior to Admission medications   Medication Sig Start Date End Date Taking? Authorizing Provider  ACCU-CHEK SMARTVIEW test strip  12/27/12   Historical Provider, MD  docusate sodium (COLACE) 100 MG capsule Take 1 capsule (100 mg total) by mouth 2 (two) times daily. While taking narcotic pain medicine. 05/06/16   Marya Amsler Ollis, PA-C  glyBURIDE (DIABETA) 2.5 MG tablet Take 2.5 mg by mouth 2 (two) times daily with a meal.    Historical Provider, MD  levothyroxine (SYNTHROID, LEVOTHROID) 25 MCG tablet Take 25 mcg by mouth daily.    Historical Provider, MD  meclizine (ANTIVERT) 25 MG tablet Take 1 tablet (25 mg total) by mouth 3 (three) times daily as needed for dizziness. 10/19/16   Merryl Hacker, MD  metFORMIN (GLUCOPHAGE) 1000 MG tablet Take 1,000 mg by mouth daily with breakfast.  Historical Provider, MD  Multiple Vitamin (MULTIVITAMIN WITH MINERALS) TABS Take 1 tablet by mouth daily.    Historical Provider, MD  oxyCODONE (ROXICODONE) 5 MG immediate release tablet Take 1-2 tablets (5-10 mg total) by mouth every 4 (four) hours as needed for moderate pain or severe pain. 05/06/16   Marya Amsler Ollis, PA-C  senna (SENOKOT) 8.6 MG TABS tablet Take 2 tablets (17.2 mg total) by mouth 2 (two) times daily. 05/06/16   Corky Sing, PA-C  sertraline (ZOLOFT) 50 MG tablet Take 50 mg by mouth daily.    Historical Provider, MD    Family History No family history on file.  Social History Social History  Substance Use Topics  .  Smoking status: Never Smoker  . Smokeless tobacco: Never Used  . Alcohol use Yes     Comment: "occas"     Allergies   Patient has no known allergies.   Review of Systems Review of Systems  Constitutional: Negative for fever.  Respiratory: Negative for shortness of breath.   Cardiovascular: Negative for chest pain.  Gastrointestinal: Positive for nausea and vomiting.  Neurological: Positive for dizziness. Negative for numbness.  All other systems reviewed and are negative.    Physical Exam Updated Vital Signs BP 137/83   Pulse 69   Temp 97.7 F (36.5 C)   Resp 20   Ht 5\' 10"  (1.778 m)   Wt 190 lb (86.2 kg)   SpO2 97%   BMI 27.26 kg/m   Physical Exam  Constitutional: He is oriented to person, place, and time. He appears well-developed and well-nourished. No distress.  HENT:  Head: Normocephalic and atraumatic.  Eyes: EOM are normal. Pupils are equal, round, and reactive to light.  No nystagmus noted  Neck: Neck supple.  Cardiovascular: Normal rate, regular rhythm and normal heart sounds.   No murmur heard. Pulmonary/Chest: Effort normal and breath sounds normal. No respiratory distress. He has no wheezes.  Abdominal: Soft. Bowel sounds are normal. There is no tenderness. There is no rebound.  Musculoskeletal: He exhibits no edema.  Neurological: He is alert and oriented to person, place, and time.  Cranial nerves II through XII intact, 5 out of 5 strength in all 4 extremity as, no drift, no dysmetria to finger-nose-finger  Skin: Skin is warm and dry.  Psychiatric: He has a normal mood and affect.  Nursing note and vitals reviewed.    ED Treatments / Results  DIAGNOSTIC STUDIES: Oxygen Saturation is 99% on RA, normal by my interpretation.  COORDINATION OF CARE: 11:44 PM-Discussed treatment plan with pt at bedside and pt agreed to plan.   Labs (all labs ordered are listed, but only abnormal results are displayed) Labs Reviewed  COMPREHENSIVE METABOLIC  PANEL - Abnormal; Notable for the following:       Result Value   Glucose, Bld 185 (*)    All other components within normal limits  CBC WITH DIFFERENTIAL/PLATELET  URINALYSIS, ROUTINE W REFLEX MICROSCOPIC (NOT AT [facility])  CBG MONITORING, ED    EKG  EKG Interpretation  Date/Time:  Monday October 18 2016 22:55:50 EST Ventricular Rate:  61 PR Interval:    QRS Duration: 92 QT Interval:  421 QTC Calculation: 424 R Axis:   77 Text Interpretation:  Sinus rhythm Minimal ST elevation, inferior leads Confirmed by Teruo Stilley  MD, Tieler Cournoyer ([id number]) on 10/19/2016 12:47:57 AM       Radiology No results found.  Procedures Procedures (including critical care time)  Medications Ordered in ED Medications  sodium  chloride 0.9 % bolus 1,000 mL (0 mLs Intravenous Stopped 10/19/16 0254)  meclizine (ANTIVERT) tablet 25 mg (25 mg Oral Given 10/19/16 0051)  promethazine (PHENERGAN) injection 12.5 mg (12.5 mg Intravenous Given 10/19/16 0054)     Initial Impression / Assessment and Plan / ED Course  I have reviewed the triage vital signs and the nursing notes.  Pertinent labs & imaging results that were available during my care of the patient were reviewed by me and considered in my medical decision making (see chart for details).  Clinical Course     Patient presents with dizziness and lightheadedness. Dizziness is not classically room spinning for vertigo; however, he does report nausea and difficulty with position changes. He is nontoxic. No cerebellar signs on neurologic exam. Basic labwork is reassuring. EKG is nonischemic without arrhythmia. Patient given meclizine and Phenergan as well as fluids. His heart rate did increase almost 20 beats upon standing. Likely some dehydration. He reports complete resolution of symptoms after treatment. Suspect some element of peripheral vertigo as well as dehydration. He is ambulatory without difficulty remains neurologically intact. Patient will be discharged  home with meclizine. Follow-up with primary physician.  After history, exam, and medical workup I feel the patient has been appropriately medically screened and is safe for discharge home. Pertinent diagnoses were discussed with the patient. Patient was given return precautions.   Final Clinical Impressions(s) / ED Diagnoses   Final diagnoses:  Dizziness  Dehydration    New Prescriptions New Prescriptions   MECLIZINE (ANTIVERT) 25 MG TABLET    Take 1 tablet (25 mg total) by mouth 3 (three) times daily as needed for dizziness.   I personally performed the services described in this documentation, which was scribed in my presence. The recorded information has been reviewed and is accurate.     Merryl Hacker, MD 10/19/16 [phone]

## 2016-10-19 DIAGNOSIS — R42 Dizziness and giddiness: Secondary | ICD-10-CM | POA: Diagnosis not present

## 2016-10-19 LAB — URINALYSIS, ROUTINE W REFLEX MICROSCOPIC
BILIRUBIN URINE: NEGATIVE
Glucose, UA: NEGATIVE mg/dL
HGB URINE DIPSTICK: NEGATIVE
Ketones, ur: NEGATIVE mg/dL
Leukocytes, UA: NEGATIVE
Nitrite: NEGATIVE
Protein, ur: NEGATIVE mg/dL
Specific Gravity, Urine: 1.017 (ref 1.005–1.030)
pH: 8 (ref 5.0–8.0)

## 2016-10-19 LAB — CBC WITH DIFFERENTIAL/PLATELET
Basophils Absolute: 0 10*3/uL (ref 0.0–0.1)
Basophils Relative: 0 %
EOS PCT: 1 %
Eosinophils Absolute: 0.1 10*3/uL (ref 0.0–0.7)
HEMATOCRIT: 46.2 % (ref 39.0–52.0)
HEMOGLOBIN: 15.8 g/dL (ref 13.0–17.0)
LYMPHS ABS: 1.6 10*3/uL (ref 0.7–4.0)
LYMPHS PCT: 19 %
MCH: 33.3 pg (ref 26.0–34.0)
MCHC: 34.2 g/dL (ref 30.0–36.0)
MCV: 97.5 fL (ref 78.0–100.0)
Monocytes Absolute: 0.5 10*3/uL (ref 0.1–1.0)
Monocytes Relative: 6 %
NEUTROS ABS: 6.1 10*3/uL (ref 1.7–7.7)
NEUTROS PCT: 74 %
Platelets: 188 10*3/uL (ref 150–400)
RBC: 4.74 MIL/uL (ref 4.22–5.81)
RDW: 13.9 % (ref 11.5–15.5)
WBC: 8.2 10*3/uL (ref 4.0–10.5)

## 2016-10-19 LAB — COMPREHENSIVE METABOLIC PANEL
ALK PHOS: 81 U/L (ref 38–126)
ALT: 34 U/L (ref 17–63)
AST: 28 U/L (ref 15–41)
Albumin: 4.6 g/dL (ref 3.5–5.0)
Anion gap: 9 (ref 5–15)
BILIRUBIN TOTAL: 1.2 mg/dL (ref 0.3–1.2)
BUN: 17 mg/dL (ref 6–20)
CALCIUM: 10.1 mg/dL (ref 8.9–10.3)
CO2: 28 mmol/L (ref 22–32)
CREATININE: 0.82 mg/dL (ref 0.61–1.24)
Chloride: 101 mmol/L (ref 101–111)
Glucose, Bld: 185 mg/dL — ABNORMAL HIGH (ref 65–99)
Potassium: 4 mmol/L (ref 3.5–5.1)
Sodium: 138 mmol/L (ref 135–145)
Total Protein: 7.7 g/dL (ref 6.5–8.1)

## 2016-10-19 MED ORDER — MECLIZINE HCL 25 MG PO TABS: 25.0000 mg | ORAL_TABLET | Freq: Three times a day (TID) | ORAL | 0 refills | Status: DC | PRN

## 2016-10-19 NOTE — Discharge Instructions (Signed)
You were seen today for dizziness. Your dizziness is likely a combination of dehydration and peripheral vertigo. He will be given a prescription to use as needed if symptoms recur. Continue to hydrate at home. Follow-up with your primary physician for recheck. If you have any new or worsening symptoms you needs to be reevaluated.

## 2016-10-19 NOTE — ED Notes (Signed)
Patient ambulated in hallway. Tolerated well and states that he "feels back to normal". Patient returned to room, siting on side of bed eating snack. Call bell within reach.

## 2016-10-19 NOTE — ED Notes (Signed)
Nurse drawing labs. 

## 2016-10-21 DIAGNOSIS — R42 Dizziness and giddiness: Secondary | ICD-10-CM | POA: Diagnosis not present

## 2016-10-21 DIAGNOSIS — E860 Dehydration: Secondary | ICD-10-CM | POA: Diagnosis not present

## 2016-11-01 DIAGNOSIS — R42 Dizziness and giddiness: Secondary | ICD-10-CM | POA: Diagnosis not present

## 2016-11-26 DIAGNOSIS — M545 Low back pain: Secondary | ICD-10-CM | POA: Diagnosis not present

## 2016-11-26 DIAGNOSIS — M5030 Other cervical disc degeneration, unspecified cervical region: Secondary | ICD-10-CM | POA: Diagnosis not present

## 2016-11-26 DIAGNOSIS — M5136 Other intervertebral disc degeneration, lumbar region: Secondary | ICD-10-CM | POA: Diagnosis not present

## 2016-11-26 DIAGNOSIS — M4722 Other spondylosis with radiculopathy, cervical region: Secondary | ICD-10-CM | POA: Diagnosis not present

## 2016-11-26 DIAGNOSIS — Z981 Arthrodesis status: Secondary | ICD-10-CM | POA: Diagnosis not present

## 2016-11-26 DIAGNOSIS — S129XXA Fracture of neck, unspecified, initial encounter: Secondary | ICD-10-CM | POA: Diagnosis not present

## 2016-11-26 DIAGNOSIS — M47816 Spondylosis without myelopathy or radiculopathy, lumbar region: Secondary | ICD-10-CM | POA: Diagnosis not present

## 2016-12-01 DIAGNOSIS — E119 Type 2 diabetes mellitus without complications: Secondary | ICD-10-CM | POA: Diagnosis not present

## 2017-01-21 DIAGNOSIS — K130 Diseases of lips: Secondary | ICD-10-CM | POA: Diagnosis not present

## 2017-01-21 DIAGNOSIS — L570 Actinic keratosis: Secondary | ICD-10-CM | POA: Diagnosis not present

## 2017-03-02 DIAGNOSIS — S129XXA Fracture of neck, unspecified, initial encounter: Secondary | ICD-10-CM | POA: Diagnosis not present

## 2017-03-02 DIAGNOSIS — M4722 Other spondylosis with radiculopathy, cervical region: Secondary | ICD-10-CM | POA: Diagnosis not present

## 2017-03-02 DIAGNOSIS — M5030 Other cervical disc degeneration, unspecified cervical region: Secondary | ICD-10-CM | POA: Diagnosis not present

## 2017-03-02 DIAGNOSIS — M5412 Radiculopathy, cervical region: Secondary | ICD-10-CM | POA: Diagnosis not present

## 2017-03-03 ENCOUNTER — Other Ambulatory Visit: Payer: Self-pay | Admitting: Neurosurgery

## 2017-03-03 DIAGNOSIS — M542 Cervicalgia: Secondary | ICD-10-CM | POA: Diagnosis not present

## 2017-03-03 DIAGNOSIS — S129XXA Fracture of neck, unspecified, initial encounter: Secondary | ICD-10-CM

## 2017-03-03 DIAGNOSIS — M5412 Radiculopathy, cervical region: Secondary | ICD-10-CM | POA: Diagnosis not present

## 2017-03-07 ENCOUNTER — Ambulatory Visit
Admission: RE | Admit: 2017-03-07 | Discharge: 2017-03-07 | Disposition: A | Discharge status: Home / Self Care | Payer: Medicare Other | Source: Ambulatory Visit | Attending: Neurosurgery | Admitting: Neurosurgery

## 2017-03-07 DIAGNOSIS — S129XXA Fracture of neck, unspecified, initial encounter: Secondary | ICD-10-CM

## 2017-03-07 DIAGNOSIS — M47812 Spondylosis without myelopathy or radiculopathy, cervical region: Secondary | ICD-10-CM | POA: Diagnosis not present

## 2017-03-10 DIAGNOSIS — M5030 Other cervical disc degeneration, unspecified cervical region: Secondary | ICD-10-CM | POA: Diagnosis not present

## 2017-03-10 DIAGNOSIS — M5412 Radiculopathy, cervical region: Secondary | ICD-10-CM | POA: Diagnosis not present

## 2017-03-10 DIAGNOSIS — S129XXA Fracture of neck, unspecified, initial encounter: Secondary | ICD-10-CM | POA: Diagnosis not present

## 2017-03-10 DIAGNOSIS — M4722 Other spondylosis with radiculopathy, cervical region: Secondary | ICD-10-CM | POA: Diagnosis not present

## 2017-05-17 DIAGNOSIS — R030 Elevated blood-pressure reading, without diagnosis of hypertension: Secondary | ICD-10-CM | POA: Diagnosis not present

## 2017-05-17 DIAGNOSIS — M4722 Other spondylosis with radiculopathy, cervical region: Secondary | ICD-10-CM | POA: Diagnosis not present

## 2017-05-17 DIAGNOSIS — S129XXA Fracture of neck, unspecified, initial encounter: Secondary | ICD-10-CM | POA: Diagnosis not present

## 2017-05-17 DIAGNOSIS — M5030 Other cervical disc degeneration, unspecified cervical region: Secondary | ICD-10-CM | POA: Diagnosis not present

## 2017-05-17 DIAGNOSIS — Z6830 Body mass index (BMI) 30.0-30.9, adult: Secondary | ICD-10-CM | POA: Diagnosis not present

## 2017-06-21 DIAGNOSIS — E7800 Pure hypercholesterolemia, unspecified: Secondary | ICD-10-CM | POA: Diagnosis not present

## 2017-06-21 DIAGNOSIS — Z7984 Long term (current) use of oral hypoglycemic drugs: Secondary | ICD-10-CM | POA: Diagnosis not present

## 2017-06-21 DIAGNOSIS — Z8639 Personal history of other endocrine, nutritional and metabolic disease: Secondary | ICD-10-CM | POA: Diagnosis not present

## 2017-06-21 DIAGNOSIS — Z89411 Acquired absence of right great toe: Secondary | ICD-10-CM | POA: Diagnosis not present

## 2017-06-21 DIAGNOSIS — E1165 Type 2 diabetes mellitus with hyperglycemia: Secondary | ICD-10-CM | POA: Diagnosis not present

## 2017-06-21 DIAGNOSIS — E039 Hypothyroidism, unspecified: Secondary | ICD-10-CM | POA: Diagnosis not present

## 2017-07-19 DIAGNOSIS — D225 Melanocytic nevi of trunk: Secondary | ICD-10-CM | POA: Diagnosis not present

## 2017-07-19 DIAGNOSIS — L821 Other seborrheic keratosis: Secondary | ICD-10-CM | POA: Diagnosis not present

## 2017-07-19 DIAGNOSIS — D1801 Hemangioma of skin and subcutaneous tissue: Secondary | ICD-10-CM | POA: Diagnosis not present

## 2017-07-19 DIAGNOSIS — L570 Actinic keratosis: Secondary | ICD-10-CM | POA: Diagnosis not present

## 2017-07-19 DIAGNOSIS — L814 Other melanin hyperpigmentation: Secondary | ICD-10-CM | POA: Diagnosis not present

## 2017-07-19 DIAGNOSIS — D692 Other nonthrombocytopenic purpura: Secondary | ICD-10-CM | POA: Diagnosis not present

## 2017-07-19 DIAGNOSIS — L111 Transient acantholytic dermatosis [Grover]: Secondary | ICD-10-CM | POA: Diagnosis not present

## 2017-09-20 DIAGNOSIS — M4722 Other spondylosis with radiculopathy, cervical region: Secondary | ICD-10-CM | POA: Diagnosis not present

## 2017-09-20 DIAGNOSIS — M5030 Other cervical disc degeneration, unspecified cervical region: Secondary | ICD-10-CM | POA: Diagnosis not present

## 2017-09-20 DIAGNOSIS — S129XXA Fracture of neck, unspecified, initial encounter: Secondary | ICD-10-CM | POA: Diagnosis not present

## 2017-09-26 DIAGNOSIS — M519 Unspecified thoracic, thoracolumbar and lumbosacral intervertebral disc disorder: Secondary | ICD-10-CM | POA: Diagnosis not present

## 2017-09-26 DIAGNOSIS — Z8639 Personal history of other endocrine, nutritional and metabolic disease: Secondary | ICD-10-CM | POA: Diagnosis not present

## 2017-09-26 DIAGNOSIS — N529 Male erectile dysfunction, unspecified: Secondary | ICD-10-CM | POA: Diagnosis not present

## 2017-09-26 DIAGNOSIS — Z0000 Encounter for general adult medical examination without abnormal findings: Secondary | ICD-10-CM | POA: Diagnosis not present

## 2017-09-26 DIAGNOSIS — E039 Hypothyroidism, unspecified: Secondary | ICD-10-CM | POA: Diagnosis not present

## 2017-09-26 DIAGNOSIS — E7800 Pure hypercholesterolemia, unspecified: Secondary | ICD-10-CM | POA: Diagnosis not present

## 2017-09-26 DIAGNOSIS — Z23 Encounter for immunization: Secondary | ICD-10-CM | POA: Diagnosis not present

## 2017-09-26 DIAGNOSIS — G894 Chronic pain syndrome: Secondary | ICD-10-CM | POA: Diagnosis not present

## 2017-09-26 DIAGNOSIS — M5090 Cervical disc disorder, unspecified, unspecified cervical region: Secondary | ICD-10-CM | POA: Diagnosis not present

## 2017-09-26 DIAGNOSIS — E1165 Type 2 diabetes mellitus with hyperglycemia: Secondary | ICD-10-CM | POA: Diagnosis not present

## 2017-09-26 DIAGNOSIS — R6882 Decreased libido: Secondary | ICD-10-CM | POA: Diagnosis not present

## 2017-10-06 DIAGNOSIS — N5201 Erectile dysfunction due to arterial insufficiency: Secondary | ICD-10-CM | POA: Diagnosis not present

## 2017-10-11 DIAGNOSIS — M25661 Stiffness of right knee, not elsewhere classified: Secondary | ICD-10-CM | POA: Diagnosis not present

## 2017-10-11 DIAGNOSIS — M170 Bilateral primary osteoarthritis of knee: Secondary | ICD-10-CM | POA: Diagnosis not present

## 2017-10-11 DIAGNOSIS — M21061 Valgus deformity, not elsewhere classified, right knee: Secondary | ICD-10-CM | POA: Diagnosis not present

## 2017-10-11 DIAGNOSIS — M21062 Valgus deformity, not elsewhere classified, left knee: Secondary | ICD-10-CM | POA: Diagnosis not present

## 2017-10-18 DIAGNOSIS — Z9889 Other specified postprocedural states: Secondary | ICD-10-CM | POA: Diagnosis not present

## 2017-10-18 DIAGNOSIS — M8588 Other specified disorders of bone density and structure, other site: Secondary | ICD-10-CM | POA: Diagnosis not present

## 2017-10-25 DIAGNOSIS — M25561 Pain in right knee: Secondary | ICD-10-CM | POA: Diagnosis not present

## 2017-10-25 DIAGNOSIS — M25562 Pain in left knee: Secondary | ICD-10-CM | POA: Diagnosis not present

## 2017-10-25 DIAGNOSIS — M170 Bilateral primary osteoarthritis of knee: Secondary | ICD-10-CM | POA: Diagnosis not present

## 2017-10-25 DIAGNOSIS — M25461 Effusion, right knee: Secondary | ICD-10-CM | POA: Diagnosis not present

## 2017-11-01 DIAGNOSIS — M25562 Pain in left knee: Secondary | ICD-10-CM | POA: Diagnosis not present

## 2017-11-01 DIAGNOSIS — M1712 Unilateral primary osteoarthritis, left knee: Secondary | ICD-10-CM | POA: Diagnosis not present

## 2017-11-02 DIAGNOSIS — M25561 Pain in right knee: Secondary | ICD-10-CM | POA: Diagnosis not present

## 2017-11-02 DIAGNOSIS — M1711 Unilateral primary osteoarthritis, right knee: Secondary | ICD-10-CM | POA: Diagnosis not present

## 2017-11-08 DIAGNOSIS — M1712 Unilateral primary osteoarthritis, left knee: Secondary | ICD-10-CM | POA: Diagnosis not present

## 2017-11-08 DIAGNOSIS — M25562 Pain in left knee: Secondary | ICD-10-CM | POA: Diagnosis not present

## 2017-11-09 DIAGNOSIS — M1711 Unilateral primary osteoarthritis, right knee: Secondary | ICD-10-CM | POA: Diagnosis not present

## 2017-11-09 DIAGNOSIS — M25561 Pain in right knee: Secondary | ICD-10-CM | POA: Diagnosis not present

## 2017-11-10 DIAGNOSIS — M5136 Other intervertebral disc degeneration, lumbar region: Secondary | ICD-10-CM | POA: Diagnosis not present

## 2017-11-10 DIAGNOSIS — M4726 Other spondylosis with radiculopathy, lumbar region: Secondary | ICD-10-CM | POA: Diagnosis not present

## 2017-11-10 DIAGNOSIS — M48061 Spinal stenosis, lumbar region without neurogenic claudication: Secondary | ICD-10-CM | POA: Diagnosis not present

## 2017-11-16 DIAGNOSIS — M25562 Pain in left knee: Secondary | ICD-10-CM | POA: Diagnosis not present

## 2017-11-16 DIAGNOSIS — M1712 Unilateral primary osteoarthritis, left knee: Secondary | ICD-10-CM | POA: Diagnosis not present

## 2017-11-17 DIAGNOSIS — M1711 Unilateral primary osteoarthritis, right knee: Secondary | ICD-10-CM | POA: Diagnosis not present

## 2017-11-17 DIAGNOSIS — M25561 Pain in right knee: Secondary | ICD-10-CM | POA: Diagnosis not present

## 2017-11-23 DIAGNOSIS — M25561 Pain in right knee: Secondary | ICD-10-CM | POA: Diagnosis not present

## 2017-11-23 DIAGNOSIS — M25562 Pain in left knee: Secondary | ICD-10-CM | POA: Diagnosis not present

## 2017-11-23 DIAGNOSIS — M170 Bilateral primary osteoarthritis of knee: Secondary | ICD-10-CM | POA: Diagnosis not present

## 2017-12-06 DIAGNOSIS — E119 Type 2 diabetes mellitus without complications: Secondary | ICD-10-CM | POA: Diagnosis not present

## 2017-12-28 DIAGNOSIS — E119 Type 2 diabetes mellitus without complications: Secondary | ICD-10-CM | POA: Diagnosis not present

## 2017-12-28 DIAGNOSIS — H2513 Age-related nuclear cataract, bilateral: Secondary | ICD-10-CM | POA: Diagnosis not present

## 2017-12-28 DIAGNOSIS — H4322 Crystalline deposits in vitreous body, left eye: Secondary | ICD-10-CM | POA: Diagnosis not present

## 2018-01-26 DIAGNOSIS — R6882 Decreased libido: Secondary | ICD-10-CM | POA: Diagnosis not present

## 2018-01-26 DIAGNOSIS — Z7984 Long term (current) use of oral hypoglycemic drugs: Secondary | ICD-10-CM | POA: Diagnosis not present

## 2018-01-26 DIAGNOSIS — E1165 Type 2 diabetes mellitus with hyperglycemia: Secondary | ICD-10-CM | POA: Diagnosis not present

## 2018-03-21 DIAGNOSIS — M545 Low back pain: Secondary | ICD-10-CM | POA: Diagnosis not present

## 2018-03-21 DIAGNOSIS — M47816 Spondylosis without myelopathy or radiculopathy, lumbar region: Secondary | ICD-10-CM | POA: Diagnosis not present

## 2018-03-21 DIAGNOSIS — M5136 Other intervertebral disc degeneration, lumbar region: Secondary | ICD-10-CM | POA: Diagnosis not present

## 2018-03-21 DIAGNOSIS — S129XXA Fracture of neck, unspecified, initial encounter: Secondary | ICD-10-CM | POA: Diagnosis not present

## 2018-04-11 DIAGNOSIS — D225 Melanocytic nevi of trunk: Secondary | ICD-10-CM | POA: Diagnosis not present

## 2018-04-11 DIAGNOSIS — D1801 Hemangioma of skin and subcutaneous tissue: Secondary | ICD-10-CM | POA: Diagnosis not present

## 2018-04-11 DIAGNOSIS — D692 Other nonthrombocytopenic purpura: Secondary | ICD-10-CM | POA: Diagnosis not present

## 2018-04-11 DIAGNOSIS — L821 Other seborrheic keratosis: Secondary | ICD-10-CM | POA: Diagnosis not present

## 2018-04-11 DIAGNOSIS — L570 Actinic keratosis: Secondary | ICD-10-CM | POA: Diagnosis not present

## 2018-04-11 DIAGNOSIS — D2262 Melanocytic nevi of left upper limb, including shoulder: Secondary | ICD-10-CM | POA: Diagnosis not present

## 2018-07-31 DIAGNOSIS — M1712 Unilateral primary osteoarthritis, left knee: Secondary | ICD-10-CM | POA: Diagnosis not present

## 2018-07-31 DIAGNOSIS — M25561 Pain in right knee: Secondary | ICD-10-CM | POA: Diagnosis not present

## 2018-07-31 DIAGNOSIS — M25562 Pain in left knee: Secondary | ICD-10-CM | POA: Diagnosis not present

## 2018-07-31 DIAGNOSIS — M170 Bilateral primary osteoarthritis of knee: Secondary | ICD-10-CM | POA: Diagnosis not present

## 2018-08-02 DIAGNOSIS — M25561 Pain in right knee: Secondary | ICD-10-CM | POA: Diagnosis not present

## 2018-08-02 DIAGNOSIS — M25461 Effusion, right knee: Secondary | ICD-10-CM | POA: Diagnosis not present

## 2018-08-02 DIAGNOSIS — M1711 Unilateral primary osteoarthritis, right knee: Secondary | ICD-10-CM | POA: Diagnosis not present

## 2018-08-03 DIAGNOSIS — M48061 Spinal stenosis, lumbar region without neurogenic claudication: Secondary | ICD-10-CM | POA: Diagnosis not present

## 2018-08-03 DIAGNOSIS — M5136 Other intervertebral disc degeneration, lumbar region: Secondary | ICD-10-CM | POA: Diagnosis not present

## 2018-08-03 DIAGNOSIS — M4726 Other spondylosis with radiculopathy, lumbar region: Secondary | ICD-10-CM | POA: Diagnosis not present

## 2018-08-07 DIAGNOSIS — M25562 Pain in left knee: Secondary | ICD-10-CM | POA: Diagnosis not present

## 2018-08-07 DIAGNOSIS — M1712 Unilateral primary osteoarthritis, left knee: Secondary | ICD-10-CM | POA: Diagnosis not present

## 2018-08-08 DIAGNOSIS — Z6828 Body mass index (BMI) 28.0-28.9, adult: Secondary | ICD-10-CM | POA: Diagnosis not present

## 2018-08-08 DIAGNOSIS — R030 Elevated blood-pressure reading, without diagnosis of hypertension: Secondary | ICD-10-CM | POA: Diagnosis not present

## 2018-08-08 DIAGNOSIS — S129XXA Fracture of neck, unspecified, initial encounter: Secondary | ICD-10-CM | POA: Diagnosis not present

## 2018-08-08 DIAGNOSIS — R29898 Other symptoms and signs involving the musculoskeletal system: Secondary | ICD-10-CM | POA: Diagnosis not present

## 2018-08-08 DIAGNOSIS — M542 Cervicalgia: Secondary | ICD-10-CM | POA: Diagnosis not present

## 2018-08-08 DIAGNOSIS — G5601 Carpal tunnel syndrome, right upper limb: Secondary | ICD-10-CM | POA: Diagnosis not present

## 2018-08-09 DIAGNOSIS — M1711 Unilateral primary osteoarthritis, right knee: Secondary | ICD-10-CM | POA: Diagnosis not present

## 2018-08-09 DIAGNOSIS — M25561 Pain in right knee: Secondary | ICD-10-CM | POA: Diagnosis not present

## 2018-08-14 DIAGNOSIS — M25562 Pain in left knee: Secondary | ICD-10-CM | POA: Diagnosis not present

## 2018-08-14 DIAGNOSIS — G5601 Carpal tunnel syndrome, right upper limb: Secondary | ICD-10-CM | POA: Diagnosis not present

## 2018-08-14 DIAGNOSIS — M1712 Unilateral primary osteoarthritis, left knee: Secondary | ICD-10-CM | POA: Diagnosis not present

## 2018-08-15 DIAGNOSIS — M1711 Unilateral primary osteoarthritis, right knee: Secondary | ICD-10-CM | POA: Diagnosis not present

## 2018-08-15 DIAGNOSIS — M25561 Pain in right knee: Secondary | ICD-10-CM | POA: Diagnosis not present

## 2018-08-15 DIAGNOSIS — G5601 Carpal tunnel syndrome, right upper limb: Secondary | ICD-10-CM | POA: Diagnosis not present

## 2018-08-15 DIAGNOSIS — R29898 Other symptoms and signs involving the musculoskeletal system: Secondary | ICD-10-CM | POA: Diagnosis not present

## 2018-08-16 DIAGNOSIS — G5601 Carpal tunnel syndrome, right upper limb: Secondary | ICD-10-CM | POA: Diagnosis not present

## 2018-08-21 DIAGNOSIS — M1712 Unilateral primary osteoarthritis, left knee: Secondary | ICD-10-CM | POA: Diagnosis not present

## 2018-08-21 DIAGNOSIS — M25562 Pain in left knee: Secondary | ICD-10-CM | POA: Diagnosis not present

## 2018-08-22 DIAGNOSIS — M25561 Pain in right knee: Secondary | ICD-10-CM | POA: Diagnosis not present

## 2018-08-22 DIAGNOSIS — M1711 Unilateral primary osteoarthritis, right knee: Secondary | ICD-10-CM | POA: Diagnosis not present

## 2018-08-28 DIAGNOSIS — M25562 Pain in left knee: Secondary | ICD-10-CM | POA: Diagnosis not present

## 2018-08-28 DIAGNOSIS — M1712 Unilateral primary osteoarthritis, left knee: Secondary | ICD-10-CM | POA: Diagnosis not present

## 2018-08-29 DIAGNOSIS — M25561 Pain in right knee: Secondary | ICD-10-CM | POA: Diagnosis not present

## 2018-08-29 DIAGNOSIS — M1711 Unilateral primary osteoarthritis, right knee: Secondary | ICD-10-CM | POA: Diagnosis not present

## 2018-09-28 DIAGNOSIS — Z89411 Acquired absence of right great toe: Secondary | ICD-10-CM | POA: Diagnosis not present

## 2018-09-28 DIAGNOSIS — E039 Hypothyroidism, unspecified: Secondary | ICD-10-CM | POA: Diagnosis not present

## 2018-09-28 DIAGNOSIS — Z23 Encounter for immunization: Secondary | ICD-10-CM | POA: Diagnosis not present

## 2018-09-28 DIAGNOSIS — N5201 Erectile dysfunction due to arterial insufficiency: Secondary | ICD-10-CM | POA: Diagnosis not present

## 2018-09-28 DIAGNOSIS — E1165 Type 2 diabetes mellitus with hyperglycemia: Secondary | ICD-10-CM | POA: Diagnosis not present

## 2018-09-28 DIAGNOSIS — Z0000 Encounter for general adult medical examination without abnormal findings: Secondary | ICD-10-CM | POA: Diagnosis not present

## 2018-09-28 DIAGNOSIS — M519 Unspecified thoracic, thoracolumbar and lumbosacral intervertebral disc disorder: Secondary | ICD-10-CM | POA: Diagnosis not present

## 2018-09-28 DIAGNOSIS — M5090 Cervical disc disorder, unspecified, unspecified cervical region: Secondary | ICD-10-CM | POA: Diagnosis not present

## 2018-09-28 DIAGNOSIS — F339 Major depressive disorder, recurrent, unspecified: Secondary | ICD-10-CM | POA: Diagnosis not present

## 2018-09-28 DIAGNOSIS — G4733 Obstructive sleep apnea (adult) (pediatric): Secondary | ICD-10-CM | POA: Diagnosis not present

## 2018-09-28 DIAGNOSIS — Z1389 Encounter for screening for other disorder: Secondary | ICD-10-CM | POA: Diagnosis not present

## 2018-09-28 DIAGNOSIS — E7800 Pure hypercholesterolemia, unspecified: Secondary | ICD-10-CM | POA: Diagnosis not present

## 2018-09-28 DIAGNOSIS — M8588 Other specified disorders of bone density and structure, other site: Secondary | ICD-10-CM | POA: Diagnosis not present

## 2018-11-13 DIAGNOSIS — J302 Other seasonal allergic rhinitis: Secondary | ICD-10-CM | POA: Diagnosis not present

## 2018-11-13 DIAGNOSIS — G4733 Obstructive sleep apnea (adult) (pediatric): Secondary | ICD-10-CM | POA: Diagnosis not present

## 2018-11-27 DIAGNOSIS — G4733 Obstructive sleep apnea (adult) (pediatric): Secondary | ICD-10-CM | POA: Diagnosis not present

## 2018-12-06 DIAGNOSIS — H52221 Regular astigmatism, right eye: Secondary | ICD-10-CM | POA: Diagnosis not present

## 2018-12-06 DIAGNOSIS — E119 Type 2 diabetes mellitus without complications: Secondary | ICD-10-CM | POA: Diagnosis not present

## 2018-12-06 DIAGNOSIS — H5213 Myopia, bilateral: Secondary | ICD-10-CM | POA: Diagnosis not present

## 2018-12-06 DIAGNOSIS — H4322 Crystalline deposits in vitreous body, left eye: Secondary | ICD-10-CM | POA: Diagnosis not present

## 2018-12-06 DIAGNOSIS — H524 Presbyopia: Secondary | ICD-10-CM | POA: Diagnosis not present

## 2018-12-06 DIAGNOSIS — H2513 Age-related nuclear cataract, bilateral: Secondary | ICD-10-CM | POA: Diagnosis not present

## 2018-12-22 DIAGNOSIS — L570 Actinic keratosis: Secondary | ICD-10-CM | POA: Diagnosis not present

## 2019-01-03 DIAGNOSIS — E039 Hypothyroidism, unspecified: Secondary | ICD-10-CM | POA: Diagnosis not present

## 2019-01-03 DIAGNOSIS — E7800 Pure hypercholesterolemia, unspecified: Secondary | ICD-10-CM | POA: Diagnosis not present

## 2019-01-03 DIAGNOSIS — Z7984 Long term (current) use of oral hypoglycemic drugs: Secondary | ICD-10-CM | POA: Diagnosis not present

## 2019-01-03 DIAGNOSIS — E1165 Type 2 diabetes mellitus with hyperglycemia: Secondary | ICD-10-CM | POA: Diagnosis not present

## 2019-01-08 DIAGNOSIS — Z7984 Long term (current) use of oral hypoglycemic drugs: Secondary | ICD-10-CM | POA: Diagnosis not present

## 2019-01-08 DIAGNOSIS — E1165 Type 2 diabetes mellitus with hyperglycemia: Secondary | ICD-10-CM | POA: Diagnosis not present

## 2019-01-11 DIAGNOSIS — E119 Type 2 diabetes mellitus without complications: Secondary | ICD-10-CM | POA: Diagnosis not present

## 2019-01-11 DIAGNOSIS — Z6829 Body mass index (BMI) 29.0-29.9, adult: Secondary | ICD-10-CM | POA: Diagnosis not present

## 2019-01-11 DIAGNOSIS — E785 Hyperlipidemia, unspecified: Secondary | ICD-10-CM | POA: Diagnosis not present

## 2019-01-11 DIAGNOSIS — Z8709 Personal history of other diseases of the respiratory system: Secondary | ICD-10-CM | POA: Diagnosis not present

## 2019-01-22 DIAGNOSIS — E119 Type 2 diabetes mellitus without complications: Secondary | ICD-10-CM | POA: Diagnosis not present

## 2019-01-25 DIAGNOSIS — Z6829 Body mass index (BMI) 29.0-29.9, adult: Secondary | ICD-10-CM | POA: Diagnosis not present

## 2019-01-25 DIAGNOSIS — E119 Type 2 diabetes mellitus without complications: Secondary | ICD-10-CM | POA: Diagnosis not present

## 2019-01-25 DIAGNOSIS — E785 Hyperlipidemia, unspecified: Secondary | ICD-10-CM | POA: Diagnosis not present

## 2019-01-25 DIAGNOSIS — Z8709 Personal history of other diseases of the respiratory system: Secondary | ICD-10-CM | POA: Diagnosis not present

## 2019-01-29 DIAGNOSIS — G4733 Obstructive sleep apnea (adult) (pediatric): Secondary | ICD-10-CM | POA: Diagnosis not present

## 2019-01-30 DIAGNOSIS — R030 Elevated blood-pressure reading, without diagnosis of hypertension: Secondary | ICD-10-CM | POA: Diagnosis not present

## 2019-01-30 DIAGNOSIS — Z9889 Other specified postprocedural states: Secondary | ICD-10-CM | POA: Diagnosis not present

## 2019-01-30 DIAGNOSIS — Z6829 Body mass index (BMI) 29.0-29.9, adult: Secondary | ICD-10-CM | POA: Diagnosis not present

## 2019-01-30 DIAGNOSIS — M545 Low back pain: Secondary | ICD-10-CM | POA: Diagnosis not present

## 2019-01-30 DIAGNOSIS — M47816 Spondylosis without myelopathy or radiculopathy, lumbar region: Secondary | ICD-10-CM | POA: Diagnosis not present

## 2019-01-30 DIAGNOSIS — M5136 Other intervertebral disc degeneration, lumbar region: Secondary | ICD-10-CM | POA: Diagnosis not present

## 2019-01-30 DIAGNOSIS — M4155 Other secondary scoliosis, thoracolumbar region: Secondary | ICD-10-CM | POA: Diagnosis not present

## 2019-01-30 DIAGNOSIS — S129XXA Fracture of neck, unspecified, initial encounter: Secondary | ICD-10-CM | POA: Diagnosis not present

## 2019-02-01 DIAGNOSIS — M25562 Pain in left knee: Secondary | ICD-10-CM | POA: Diagnosis not present

## 2019-02-01 DIAGNOSIS — M25461 Effusion, right knee: Secondary | ICD-10-CM | POA: Diagnosis not present

## 2019-02-01 DIAGNOSIS — M170 Bilateral primary osteoarthritis of knee: Secondary | ICD-10-CM | POA: Diagnosis not present

## 2019-02-01 DIAGNOSIS — M25462 Effusion, left knee: Secondary | ICD-10-CM | POA: Diagnosis not present

## 2019-02-01 DIAGNOSIS — M25561 Pain in right knee: Secondary | ICD-10-CM | POA: Diagnosis not present

## 2019-03-23 DIAGNOSIS — M4726 Other spondylosis with radiculopathy, lumbar region: Secondary | ICD-10-CM | POA: Diagnosis not present

## 2019-03-23 DIAGNOSIS — M48061 Spinal stenosis, lumbar region without neurogenic claudication: Secondary | ICD-10-CM | POA: Diagnosis not present

## 2019-03-23 DIAGNOSIS — M5136 Other intervertebral disc degeneration, lumbar region: Secondary | ICD-10-CM | POA: Diagnosis not present

## 2019-05-02 DIAGNOSIS — D692 Other nonthrombocytopenic purpura: Secondary | ICD-10-CM | POA: Diagnosis not present

## 2019-05-02 DIAGNOSIS — L84 Corns and callosities: Secondary | ICD-10-CM | POA: Diagnosis not present

## 2019-05-02 DIAGNOSIS — L570 Actinic keratosis: Secondary | ICD-10-CM | POA: Diagnosis not present

## 2019-05-02 DIAGNOSIS — L814 Other melanin hyperpigmentation: Secondary | ICD-10-CM | POA: Diagnosis not present

## 2019-05-02 DIAGNOSIS — K130 Diseases of lips: Secondary | ICD-10-CM | POA: Diagnosis not present

## 2019-05-02 DIAGNOSIS — B070 Plantar wart: Secondary | ICD-10-CM | POA: Diagnosis not present

## 2019-05-02 DIAGNOSIS — L821 Other seborrheic keratosis: Secondary | ICD-10-CM | POA: Diagnosis not present

## 2019-05-02 DIAGNOSIS — D1801 Hemangioma of skin and subcutaneous tissue: Secondary | ICD-10-CM | POA: Diagnosis not present

## 2019-05-29 DIAGNOSIS — Z8639 Personal history of other endocrine, nutritional and metabolic disease: Secondary | ICD-10-CM | POA: Diagnosis not present

## 2019-05-29 DIAGNOSIS — E1165 Type 2 diabetes mellitus with hyperglycemia: Secondary | ICD-10-CM | POA: Diagnosis not present

## 2019-05-29 DIAGNOSIS — E039 Hypothyroidism, unspecified: Secondary | ICD-10-CM | POA: Diagnosis not present

## 2019-05-29 DIAGNOSIS — E7800 Pure hypercholesterolemia, unspecified: Secondary | ICD-10-CM | POA: Diagnosis not present

## 2019-06-11 DIAGNOSIS — E785 Hyperlipidemia, unspecified: Secondary | ICD-10-CM | POA: Diagnosis not present

## 2019-06-11 DIAGNOSIS — E119 Type 2 diabetes mellitus without complications: Secondary | ICD-10-CM | POA: Diagnosis not present

## 2019-06-11 DIAGNOSIS — Z8709 Personal history of other diseases of the respiratory system: Secondary | ICD-10-CM | POA: Diagnosis not present

## 2019-06-11 DIAGNOSIS — Z6829 Body mass index (BMI) 29.0-29.9, adult: Secondary | ICD-10-CM | POA: Diagnosis not present

## 2019-06-11 DIAGNOSIS — Z7189 Other specified counseling: Secondary | ICD-10-CM | POA: Diagnosis not present

## 2019-07-31 ENCOUNTER — Other Ambulatory Visit: Payer: Self-pay

## 2019-07-31 DIAGNOSIS — R6889 Other general symptoms and signs: Secondary | ICD-10-CM | POA: Diagnosis not present

## 2019-07-31 DIAGNOSIS — Z20822 Contact with and (suspected) exposure to covid-19: Secondary | ICD-10-CM

## 2019-08-02 LAB — NOVEL CORONAVIRUS, NAA

## 2019-08-07 ENCOUNTER — Other Ambulatory Visit: Payer: Self-pay

## 2019-08-07 DIAGNOSIS — Z20822 Contact with and (suspected) exposure to covid-19: Secondary | ICD-10-CM

## 2019-08-09 LAB — NOVEL CORONAVIRUS, NAA: SARS-CoV-2, NAA: NOT DETECTED

## 2019-08-23 DIAGNOSIS — M4726 Other spondylosis with radiculopathy, lumbar region: Secondary | ICD-10-CM | POA: Diagnosis not present

## 2019-08-23 DIAGNOSIS — M48061 Spinal stenosis, lumbar region without neurogenic claudication: Secondary | ICD-10-CM | POA: Diagnosis not present

## 2019-08-23 DIAGNOSIS — M5136 Other intervertebral disc degeneration, lumbar region: Secondary | ICD-10-CM | POA: Diagnosis not present

## 2019-09-03 DIAGNOSIS — E119 Type 2 diabetes mellitus without complications: Secondary | ICD-10-CM | POA: Diagnosis not present

## 2019-09-11 DIAGNOSIS — E119 Type 2 diabetes mellitus without complications: Secondary | ICD-10-CM | POA: Diagnosis not present

## 2019-09-11 DIAGNOSIS — E785 Hyperlipidemia, unspecified: Secondary | ICD-10-CM | POA: Diagnosis not present

## 2019-09-11 DIAGNOSIS — Z8709 Personal history of other diseases of the respiratory system: Secondary | ICD-10-CM | POA: Diagnosis not present

## 2019-09-11 DIAGNOSIS — Z7189 Other specified counseling: Secondary | ICD-10-CM | POA: Diagnosis not present

## 2019-09-11 DIAGNOSIS — Z23 Encounter for immunization: Secondary | ICD-10-CM | POA: Diagnosis not present

## 2019-09-19 DIAGNOSIS — E7800 Pure hypercholesterolemia, unspecified: Secondary | ICD-10-CM | POA: Diagnosis not present

## 2019-09-19 DIAGNOSIS — E039 Hypothyroidism, unspecified: Secondary | ICD-10-CM | POA: Diagnosis not present

## 2019-09-19 DIAGNOSIS — Z79899 Other long term (current) drug therapy: Secondary | ICD-10-CM | POA: Diagnosis not present

## 2019-09-19 DIAGNOSIS — E1165 Type 2 diabetes mellitus with hyperglycemia: Secondary | ICD-10-CM | POA: Diagnosis not present

## 2019-10-01 DIAGNOSIS — F339 Major depressive disorder, recurrent, unspecified: Secondary | ICD-10-CM | POA: Diagnosis not present

## 2019-10-01 DIAGNOSIS — M519 Unspecified thoracic, thoracolumbar and lumbosacral intervertebral disc disorder: Secondary | ICD-10-CM | POA: Diagnosis not present

## 2019-10-01 DIAGNOSIS — M8588 Other specified disorders of bone density and structure, other site: Secondary | ICD-10-CM | POA: Diagnosis not present

## 2019-10-01 DIAGNOSIS — M5090 Cervical disc disorder, unspecified, unspecified cervical region: Secondary | ICD-10-CM | POA: Diagnosis not present

## 2019-10-01 DIAGNOSIS — Z0000 Encounter for general adult medical examination without abnormal findings: Secondary | ICD-10-CM | POA: Diagnosis not present

## 2019-10-01 DIAGNOSIS — G4733 Obstructive sleep apnea (adult) (pediatric): Secondary | ICD-10-CM | POA: Diagnosis not present

## 2019-10-01 DIAGNOSIS — Z8639 Personal history of other endocrine, nutritional and metabolic disease: Secondary | ICD-10-CM | POA: Diagnosis not present

## 2019-10-01 DIAGNOSIS — Z89411 Acquired absence of right great toe: Secondary | ICD-10-CM | POA: Diagnosis not present

## 2019-10-01 DIAGNOSIS — E1165 Type 2 diabetes mellitus with hyperglycemia: Secondary | ICD-10-CM | POA: Diagnosis not present

## 2019-10-01 DIAGNOSIS — R238 Other skin changes: Secondary | ICD-10-CM | POA: Diagnosis not present

## 2019-10-01 DIAGNOSIS — E7800 Pure hypercholesterolemia, unspecified: Secondary | ICD-10-CM | POA: Diagnosis not present

## 2019-10-01 DIAGNOSIS — E039 Hypothyroidism, unspecified: Secondary | ICD-10-CM | POA: Diagnosis not present

## 2019-10-03 DIAGNOSIS — E7800 Pure hypercholesterolemia, unspecified: Secondary | ICD-10-CM | POA: Diagnosis not present

## 2019-10-03 DIAGNOSIS — M5136 Other intervertebral disc degeneration, lumbar region: Secondary | ICD-10-CM | POA: Diagnosis not present

## 2019-10-03 DIAGNOSIS — E1165 Type 2 diabetes mellitus with hyperglycemia: Secondary | ICD-10-CM | POA: Diagnosis not present

## 2019-10-03 DIAGNOSIS — E039 Hypothyroidism, unspecified: Secondary | ICD-10-CM | POA: Diagnosis not present

## 2019-10-03 DIAGNOSIS — Z0000 Encounter for general adult medical examination without abnormal findings: Secondary | ICD-10-CM | POA: Diagnosis not present

## 2019-10-03 DIAGNOSIS — M170 Bilateral primary osteoarthritis of knee: Secondary | ICD-10-CM | POA: Diagnosis not present

## 2019-10-03 DIAGNOSIS — M8588 Other specified disorders of bone density and structure, other site: Secondary | ICD-10-CM | POA: Diagnosis not present

## 2019-10-03 DIAGNOSIS — M25561 Pain in right knee: Secondary | ICD-10-CM | POA: Diagnosis not present

## 2019-10-03 DIAGNOSIS — M1711 Unilateral primary osteoarthritis, right knee: Secondary | ICD-10-CM | POA: Diagnosis not present

## 2019-10-03 DIAGNOSIS — M4155 Other secondary scoliosis, thoracolumbar region: Secondary | ICD-10-CM | POA: Diagnosis not present

## 2019-10-03 DIAGNOSIS — Z125 Encounter for screening for malignant neoplasm of prostate: Secondary | ICD-10-CM | POA: Diagnosis not present

## 2019-10-03 DIAGNOSIS — Z89411 Acquired absence of right great toe: Secondary | ICD-10-CM | POA: Diagnosis not present

## 2019-10-03 DIAGNOSIS — M25562 Pain in left knee: Secondary | ICD-10-CM | POA: Diagnosis not present

## 2019-10-03 DIAGNOSIS — M47816 Spondylosis without myelopathy or radiculopathy, lumbar region: Secondary | ICD-10-CM | POA: Diagnosis not present

## 2019-10-03 DIAGNOSIS — M519 Unspecified thoracic, thoracolumbar and lumbosacral intervertebral disc disorder: Secondary | ICD-10-CM | POA: Diagnosis not present

## 2019-10-03 DIAGNOSIS — M5090 Cervical disc disorder, unspecified, unspecified cervical region: Secondary | ICD-10-CM | POA: Diagnosis not present

## 2019-10-03 DIAGNOSIS — Z8639 Personal history of other endocrine, nutritional and metabolic disease: Secondary | ICD-10-CM | POA: Diagnosis not present

## 2019-10-03 DIAGNOSIS — F339 Major depressive disorder, recurrent, unspecified: Secondary | ICD-10-CM | POA: Diagnosis not present

## 2019-10-03 DIAGNOSIS — R030 Elevated blood-pressure reading, without diagnosis of hypertension: Secondary | ICD-10-CM | POA: Diagnosis not present

## 2019-10-03 DIAGNOSIS — Z6829 Body mass index (BMI) 29.0-29.9, adult: Secondary | ICD-10-CM | POA: Diagnosis not present

## 2019-10-03 DIAGNOSIS — M25461 Effusion, right knee: Secondary | ICD-10-CM | POA: Diagnosis not present

## 2019-10-03 DIAGNOSIS — R238 Other skin changes: Secondary | ICD-10-CM | POA: Diagnosis not present

## 2019-10-03 DIAGNOSIS — M545 Low back pain: Secondary | ICD-10-CM | POA: Diagnosis not present

## 2019-10-03 DIAGNOSIS — Z981 Arthrodesis status: Secondary | ICD-10-CM | POA: Diagnosis not present

## 2019-10-04 ENCOUNTER — Other Ambulatory Visit: Payer: Self-pay | Admitting: Family Medicine

## 2019-10-04 DIAGNOSIS — M25462 Effusion, left knee: Secondary | ICD-10-CM | POA: Diagnosis not present

## 2019-10-04 DIAGNOSIS — N5201 Erectile dysfunction due to arterial insufficiency: Secondary | ICD-10-CM | POA: Diagnosis not present

## 2019-10-04 DIAGNOSIS — M1712 Unilateral primary osteoarthritis, left knee: Secondary | ICD-10-CM | POA: Diagnosis not present

## 2019-10-04 DIAGNOSIS — N400 Enlarged prostate without lower urinary tract symptoms: Secondary | ICD-10-CM | POA: Diagnosis not present

## 2019-10-04 DIAGNOSIS — M8580 Other specified disorders of bone density and structure, unspecified site: Secondary | ICD-10-CM

## 2019-10-04 DIAGNOSIS — M25562 Pain in left knee: Secondary | ICD-10-CM | POA: Diagnosis not present

## 2019-10-10 DIAGNOSIS — M25529 Pain in unspecified elbow: Secondary | ICD-10-CM | POA: Diagnosis not present

## 2019-10-10 DIAGNOSIS — M25561 Pain in right knee: Secondary | ICD-10-CM | POA: Diagnosis not present

## 2019-10-10 DIAGNOSIS — M1711 Unilateral primary osteoarthritis, right knee: Secondary | ICD-10-CM | POA: Diagnosis not present

## 2019-10-10 DIAGNOSIS — M170 Bilateral primary osteoarthritis of knee: Secondary | ICD-10-CM | POA: Diagnosis not present

## 2019-10-11 DIAGNOSIS — M25562 Pain in left knee: Secondary | ICD-10-CM | POA: Diagnosis not present

## 2019-10-11 DIAGNOSIS — M1712 Unilateral primary osteoarthritis, left knee: Secondary | ICD-10-CM | POA: Diagnosis not present

## 2019-10-16 DIAGNOSIS — M1711 Unilateral primary osteoarthritis, right knee: Secondary | ICD-10-CM | POA: Diagnosis not present

## 2019-10-16 DIAGNOSIS — M545 Low back pain: Secondary | ICD-10-CM | POA: Diagnosis not present

## 2019-10-16 DIAGNOSIS — M25561 Pain in right knee: Secondary | ICD-10-CM | POA: Diagnosis not present

## 2019-10-17 DIAGNOSIS — M25522 Pain in left elbow: Secondary | ICD-10-CM | POA: Diagnosis not present

## 2019-10-17 DIAGNOSIS — M25511 Pain in right shoulder: Secondary | ICD-10-CM | POA: Diagnosis not present

## 2019-10-17 DIAGNOSIS — M170 Bilateral primary osteoarthritis of knee: Secondary | ICD-10-CM | POA: Diagnosis not present

## 2019-10-17 DIAGNOSIS — M19021 Primary osteoarthritis, right elbow: Secondary | ICD-10-CM | POA: Diagnosis not present

## 2019-10-17 DIAGNOSIS — M25521 Pain in right elbow: Secondary | ICD-10-CM | POA: Diagnosis not present

## 2019-10-17 DIAGNOSIS — M25562 Pain in left knee: Secondary | ICD-10-CM | POA: Diagnosis not present

## 2019-10-22 DIAGNOSIS — M5136 Other intervertebral disc degeneration, lumbar region: Secondary | ICD-10-CM | POA: Diagnosis not present

## 2019-10-22 DIAGNOSIS — M4155 Other secondary scoliosis, thoracolumbar region: Secondary | ICD-10-CM | POA: Diagnosis not present

## 2019-10-22 DIAGNOSIS — M47816 Spondylosis without myelopathy or radiculopathy, lumbar region: Secondary | ICD-10-CM | POA: Diagnosis not present

## 2019-10-22 DIAGNOSIS — M48061 Spinal stenosis, lumbar region without neurogenic claudication: Secondary | ICD-10-CM | POA: Diagnosis not present

## 2019-10-22 DIAGNOSIS — G8929 Other chronic pain: Secondary | ICD-10-CM | POA: Diagnosis not present

## 2019-10-22 DIAGNOSIS — M545 Low back pain: Secondary | ICD-10-CM | POA: Diagnosis not present

## 2019-10-22 DIAGNOSIS — Z981 Arthrodesis status: Secondary | ICD-10-CM | POA: Diagnosis not present

## 2019-10-24 DIAGNOSIS — M25561 Pain in right knee: Secondary | ICD-10-CM | POA: Diagnosis not present

## 2019-10-24 DIAGNOSIS — M1711 Unilateral primary osteoarthritis, right knee: Secondary | ICD-10-CM | POA: Diagnosis not present

## 2019-10-25 DIAGNOSIS — M6281 Muscle weakness (generalized): Secondary | ICD-10-CM | POA: Diagnosis not present

## 2019-10-25 DIAGNOSIS — M25611 Stiffness of right shoulder, not elsewhere classified: Secondary | ICD-10-CM | POA: Diagnosis not present

## 2019-10-25 DIAGNOSIS — M67911 Unspecified disorder of synovium and tendon, right shoulder: Secondary | ICD-10-CM | POA: Diagnosis not present

## 2019-10-25 DIAGNOSIS — M1711 Unilateral primary osteoarthritis, right knee: Secondary | ICD-10-CM | POA: Diagnosis not present

## 2019-10-25 DIAGNOSIS — M7711 Lateral epicondylitis, right elbow: Secondary | ICD-10-CM | POA: Diagnosis not present

## 2019-10-25 DIAGNOSIS — M25561 Pain in right knee: Secondary | ICD-10-CM | POA: Diagnosis not present

## 2019-10-31 DIAGNOSIS — M25561 Pain in right knee: Secondary | ICD-10-CM | POA: Diagnosis not present

## 2019-10-31 DIAGNOSIS — M170 Bilateral primary osteoarthritis of knee: Secondary | ICD-10-CM | POA: Diagnosis not present

## 2019-11-01 DIAGNOSIS — M4726 Other spondylosis with radiculopathy, lumbar region: Secondary | ICD-10-CM | POA: Diagnosis not present

## 2019-11-01 DIAGNOSIS — M170 Bilateral primary osteoarthritis of knee: Secondary | ICD-10-CM | POA: Diagnosis not present

## 2019-11-01 DIAGNOSIS — M5136 Other intervertebral disc degeneration, lumbar region: Secondary | ICD-10-CM | POA: Diagnosis not present

## 2019-11-01 DIAGNOSIS — M25562 Pain in left knee: Secondary | ICD-10-CM | POA: Diagnosis not present

## 2019-11-01 DIAGNOSIS — M48061 Spinal stenosis, lumbar region without neurogenic claudication: Secondary | ICD-10-CM | POA: Diagnosis not present

## 2019-12-05 DIAGNOSIS — E119 Type 2 diabetes mellitus without complications: Secondary | ICD-10-CM | POA: Diagnosis not present

## 2019-12-05 DIAGNOSIS — E118 Type 2 diabetes mellitus with unspecified complications: Secondary | ICD-10-CM | POA: Diagnosis not present

## 2019-12-12 DIAGNOSIS — Z7189 Other specified counseling: Secondary | ICD-10-CM | POA: Diagnosis not present

## 2019-12-12 DIAGNOSIS — E785 Hyperlipidemia, unspecified: Secondary | ICD-10-CM | POA: Diagnosis not present

## 2019-12-12 DIAGNOSIS — E119 Type 2 diabetes mellitus without complications: Secondary | ICD-10-CM | POA: Diagnosis not present

## 2019-12-12 DIAGNOSIS — Z8709 Personal history of other diseases of the respiratory system: Secondary | ICD-10-CM | POA: Diagnosis not present

## 2019-12-12 DIAGNOSIS — Z6830 Body mass index (BMI) 30.0-30.9, adult: Secondary | ICD-10-CM | POA: Diagnosis not present

## 2019-12-18 DIAGNOSIS — E119 Type 2 diabetes mellitus without complications: Secondary | ICD-10-CM | POA: Diagnosis not present

## 2019-12-28 ENCOUNTER — Other Ambulatory Visit: Payer: Self-pay

## 2019-12-28 ENCOUNTER — Ambulatory Visit
Admission: RE | Admit: 2019-12-28 | Discharge: 2019-12-28 | Disposition: A | Discharge status: Home / Self Care | Payer: Medicare Other | Source: Ambulatory Visit | Attending: Family Medicine | Admitting: Family Medicine

## 2019-12-28 DIAGNOSIS — M85851 Other specified disorders of bone density and structure, right thigh: Secondary | ICD-10-CM | POA: Diagnosis not present

## 2019-12-28 DIAGNOSIS — M8580 Other specified disorders of bone density and structure, unspecified site: Secondary | ICD-10-CM

## 2020-01-01 DIAGNOSIS — H2511 Age-related nuclear cataract, right eye: Secondary | ICD-10-CM | POA: Diagnosis not present

## 2020-01-01 DIAGNOSIS — H25043 Posterior subcapsular polar age-related cataract, bilateral: Secondary | ICD-10-CM | POA: Diagnosis not present

## 2020-01-01 DIAGNOSIS — H18413 Arcus senilis, bilateral: Secondary | ICD-10-CM | POA: Diagnosis not present

## 2020-01-01 DIAGNOSIS — H2513 Age-related nuclear cataract, bilateral: Secondary | ICD-10-CM | POA: Diagnosis not present

## 2020-01-01 DIAGNOSIS — H25013 Cortical age-related cataract, bilateral: Secondary | ICD-10-CM | POA: Diagnosis not present

## 2020-01-16 DIAGNOSIS — H2513 Age-related nuclear cataract, bilateral: Secondary | ICD-10-CM | POA: Diagnosis not present

## 2020-01-16 DIAGNOSIS — H2511 Age-related nuclear cataract, right eye: Secondary | ICD-10-CM | POA: Diagnosis not present

## 2020-01-16 DIAGNOSIS — H18413 Arcus senilis, bilateral: Secondary | ICD-10-CM | POA: Diagnosis not present

## 2020-01-16 DIAGNOSIS — H25013 Cortical age-related cataract, bilateral: Secondary | ICD-10-CM | POA: Diagnosis not present

## 2020-01-16 DIAGNOSIS — H25043 Posterior subcapsular polar age-related cataract, bilateral: Secondary | ICD-10-CM | POA: Diagnosis not present

## 2020-01-17 DIAGNOSIS — Z23 Encounter for immunization: Secondary | ICD-10-CM | POA: Diagnosis not present

## 2020-01-18 DIAGNOSIS — M519 Unspecified thoracic, thoracolumbar and lumbosacral intervertebral disc disorder: Secondary | ICD-10-CM | POA: Diagnosis not present

## 2020-01-22 DIAGNOSIS — R030 Elevated blood-pressure reading, without diagnosis of hypertension: Secondary | ICD-10-CM | POA: Diagnosis not present

## 2020-01-22 DIAGNOSIS — M5136 Other intervertebral disc degeneration, lumbar region: Secondary | ICD-10-CM | POA: Diagnosis not present

## 2020-01-22 DIAGNOSIS — M48061 Spinal stenosis, lumbar region without neurogenic claudication: Secondary | ICD-10-CM | POA: Diagnosis not present

## 2020-01-22 DIAGNOSIS — M5126 Other intervertebral disc displacement, lumbar region: Secondary | ICD-10-CM | POA: Diagnosis not present

## 2020-01-22 DIAGNOSIS — M4155 Other secondary scoliosis, thoracolumbar region: Secondary | ICD-10-CM | POA: Diagnosis not present

## 2020-01-22 DIAGNOSIS — M48062 Spinal stenosis, lumbar region with neurogenic claudication: Secondary | ICD-10-CM | POA: Diagnosis not present

## 2020-01-22 DIAGNOSIS — M47816 Spondylosis without myelopathy or radiculopathy, lumbar region: Secondary | ICD-10-CM | POA: Diagnosis not present

## 2020-01-22 DIAGNOSIS — Z6830 Body mass index (BMI) 30.0-30.9, adult: Secondary | ICD-10-CM | POA: Diagnosis not present

## 2020-01-24 ENCOUNTER — Ambulatory Visit: Payer: Medicare Other | Attending: Neurosurgery | Admitting: Physical Therapy

## 2020-01-24 ENCOUNTER — Ambulatory Visit: Payer: Medicare Other | Admitting: Physical Therapy

## 2020-01-24 ENCOUNTER — Other Ambulatory Visit: Payer: Self-pay

## 2020-01-24 DIAGNOSIS — G8929 Other chronic pain: Secondary | ICD-10-CM | POA: Insufficient documentation

## 2020-01-24 DIAGNOSIS — M545 Low back pain, unspecified: Secondary | ICD-10-CM

## 2020-01-24 NOTE — Patient Instructions (Signed)
Access Code: [facility]  URL: [email or web address]  Date: 01/24/2020  Prepared by: Madelyn Flavors   Exercises Supine Bridge - 10 reps - 1-2 sets - 5 sec hold - 2x daily - 7x weekly Supine Lower Trunk Rotation - 5 reps - 1 sets - 10 sec hold - 1x daily - 7x weekly Supine Single Knee to Chest Stretch - 5 reps - 1 sets - 20 sec hold                            - 1x daily - 7x weekly Static Prone on Elbows - 3 reps - 1 sets - 30-60 sec hold - 2-3x daily - 7x weekly Patient Education Trigger Point Dry Needling

## 2020-01-24 NOTE — Therapy (Signed)
[facility] [address] [place], [place], [address] Phone: [phone]   Fax:  [phone]  Physical Therapy Evaluation  Patient Details  Name: Trevor Grimes MRN: [id number] Date of Birth: [birth year] Referring Provider (PT): Sherlie Ban. Sterns    Encounter Date: 01/24/2020  PT End of Session - 01/24/20 1452    Visit Number  1    Date for PT Re-Evaluation  03/06/20    Authorization Type  Medicare    Progress Note Due on Visit  10    PT Start Time  1452    PT Stop Time  1533    PT Time Calculation (min)  41 min    Activity Tolerance  Patient tolerated treatment well    Behavior During Therapy  [facility] for tasks assessed/performed       Past Medical History:  Diagnosis Date  . Anxiety   . Arthritis   . Chronic back pain   . Depression   . Diabetes mellitus without complication ([place])   . Hypothyroidism   . Kidney stones    hx of  . Sleep apnea    Has CPAP machine, does not wear every night  . Thyroid disease     Past Surgical History:  Procedure Laterality Date  . AMPUTATION Right 01/25/2013   Procedure: RIGHT 1ST RAY AMPUTATION ;  Surgeon: Wylene Simmer, MD;  Location: [place];  Service: Orthopedics;  Laterality: Right;  . BACK SURGERY    . CERVICAL FUSION    . CHEILECTOMY Left 05/06/2016   Procedure: LEFT HALLUX METATARSAL PHALANGEAL JOINT CHEILECTOMY AND CARTIVA  RESURFACING;  Surgeon: Wylene Simmer, MD;  Location: [place];  Service: Orthopedics;  Laterality: Left;  . I & D EXTREMITY Right 01/25/2013   Procedure: IRRIGATION AND DEBRIDEMENT Right Hallux;  Surgeon: Wylene Simmer, MD;  Location: [place];  Service: Orthopedics;  Laterality: Right;  . KNEE SURGERY      There were no vitals filed for this visit.   Subjective Assessment - 01/24/20 1456    Subjective  Patient reports long h/o of low back pain that has been treated with injections every 6 months.  The last 2 shots in Oct and Dec  2020 did not work.  MRI shows stenosis. Pain is in the low back and left leg to mid thigh and intermittently into RLE.    Pertinent History  lumbar laminectomy, cervical surgery, DM, asthma, anxiety, OA    How long can you walk comfortably?  1/2 mile    Diagnostic tests  MRI    Patient Stated Goals  Find relief    Currently in Pain?  Yes    Pain Score  6    up to 8-10   Pain Location  Back    Pain Orientation  Lower    Pain Descriptors / Indicators  Sharp    Pain Type  Chronic pain    Pain Onset  More than a month ago    Pain Frequency  Constant    Aggravating Factors   yard work, prolonged walking and standing, getting up in the morning    Pain Relieving Factors  meds, lying on back, ice    Effect of Pain on Daily Activities  limited by pain         [facility] PT Assessment - 01/24/20 0001      Assessment   Medical Diagnosis  lumbar stenosis with neurogenic claudication    Referring Provider (PT)  Sherlie Ban.  Sterns     Onset Date/Surgical Date  06/23/19    Prior Therapy  yes      Precautions   Precautions  None      Restrictions   Weight Bearing Restrictions  No      Balance Screen   Has the patient fallen in the past 6 months  No    Has the patient had a decrease in activity level because of a fear of falling?   No    Is the patient reluctant to leave their home because of a fear of falling?   No      Home Film/video editor residence    Living Arrangements  Spouse/significant other    Additional Comments  stairs okay      Prior Function   Level of Independence  Independent    Vocation  Retired    Leisure  working in orchard and other yard work in [employer]      Observation/Other Assessments   Focus on Burns (FOTO)   44% limited      Posture/Postural Control   Posture/Postural Control  Postural limitations    Postural Limitations  Decreased lumbar lordosis;Posterior pelvic tilt      ROM / Strength   AROM / PROM /  Strength  AROM;Strength      AROM   AROM Assessment Site  Lumbar    Lumbar Flexion  --   WFL   Lumbar Extension  --   WFL   Lumbar - Right Side Bend  7    Lumbar - Left Side Bend  3    Lumbar - Right Rotation  50%    Lumbar - Left Rotation  50%      Strength   Overall Strength Comments  bil hip ext 4/5, else BLE 5/5      Flexibility   Soft Tissue Assessment /Muscle Length  yes    Hamstrings  marked bil    Quadriceps  tight left with pain into low back; right tight    Piriformis  WNL                Objective measurements completed on examination: See above findings.              PT Education - 01/24/20 1542    Education Details  HEP; DN education    Person(s) Educated  Patient    Methods  Explanation;Demonstration;Handout    Comprehension  Verbalized understanding;Returned demonstration       PT Short Term Goals - 01/24/20 2031      PT SHORT TERM GOAL #1   Title  Independent and compliant with initial HEP    Status  New    Target Date  02/07/20        PT Long Term Goals - 01/24/20 2032      PT LONG TERM GOAL #1   Title  Ind with HEP for flexibility and core strength    Status  New    Target Date  03/06/20      PT LONG TERM GOAL #2   Title  Patient to report >= 50% decreased pain with ADLS    Status  New      PT LONG TERM GOAL #3   Title  Patient to report decreased pain in the morning by 50% or more.    Status  New             Plan - 01/24/20 1535  Clinical Impression Statement  Patient presents with long h/o of LBP. He has had a laminectomy in the past and has been treating the chronic pain with injections every 6 months. They are no longer working. He has constant pain in the low back and left leg to thigh. He is very tight in his lumbar spine and has flexibility deficits. His pain limits his tolerance to ADLS. He will benefit from PT to decrease his pain and improve this tolerance.    Personal Factors and Comorbidities   Comorbidity 3+    Comorbidities  lumbar laminectomy, cervical surgery, DM, asthma, anxiety, OA    Examination-Activity Limitations  Stand;Locomotion Level    Examination-Participation Restrictions  Yard Work    Stability/Clinical Decision Making  Evolving/Moderate complexity    Clinical Decision Making  Low    Rehab Potential  Good    PT Frequency  2x / week    PT Duration  6 weeks    PT Treatment/Interventions  ADLs/Self Care Home Management;Cryotherapy;Electrical Stimulation;Iontophoresis 4mg /ml Dexamethasone;Moist Heat;Traction;Therapeutic activities;Therapeutic exercise;Neuromuscular re-education;Manual techniques;Patient/family education;Dry needling;Spinal Manipulations;Taping    PT Next Visit Plan  lumbar ROM, HS/HS/quad flexibility, core and lumbar stab, DN to lumbar    PT Home Exercise Plan  [facility]       Patient will benefit from skilled therapeutic intervention in order to improve the following deficits and impairments:  Decreased range of motion, Increased muscle spasms, Decreased activity tolerance, Pain, Impaired flexibility, Postural dysfunction, Decreased strength  Visit Diagnosis: Chronic midline low back pain without sciatica - Plan: PT plan of care cert/re-cert     Problem List There are no problems to display for this patient.  Almyra Free Bentlee Drier PT 01/24/2020, 8:38 PM  [facility] [address] [place], [place], [address] Phone: [phone]   Fax:  [phone]  Name: Trevor Grimes MRN: [id number] Date of Birth: [birth year]

## 2020-01-30 DIAGNOSIS — G4733 Obstructive sleep apnea (adult) (pediatric): Secondary | ICD-10-CM | POA: Diagnosis not present

## 2020-01-31 ENCOUNTER — Ambulatory Visit: Payer: Medicare Other | Admitting: Physical Therapy

## 2020-01-31 ENCOUNTER — Encounter: Payer: Self-pay | Admitting: Physical Therapy

## 2020-01-31 ENCOUNTER — Other Ambulatory Visit: Payer: Self-pay

## 2020-01-31 DIAGNOSIS — M545 Low back pain: Secondary | ICD-10-CM | POA: Diagnosis not present

## 2020-01-31 DIAGNOSIS — G8929 Other chronic pain: Secondary | ICD-10-CM | POA: Diagnosis not present

## 2020-01-31 NOTE — Therapy (Signed)
[facility] [address] [place], [place], [address] Phone: [phone]   Fax:  [phone]  Physical Therapy Treatment  Patient Details  Name: Trevor Grimes MRN: [id number] Date of Birth: 01/19/1951 Referring Provider (PT): Sherlie Ban. Sterns    Encounter Date: 01/31/2020  PT End of Session - 01/31/20 0756    Visit Number  2    Date for PT Re-Evaluation  03/06/20    Authorization Type  Medicare    PT Start Time  0800    PT Stop Time  0844    PT Time Calculation (min)  44 min    Activity Tolerance  Patient tolerated treatment well    Behavior During Therapy  [facility] for tasks assessed/performed       Past Medical History:  Diagnosis Date  . Anxiety   . Arthritis   . Chronic back pain   . Depression   . Diabetes mellitus without complication ([place])   . Hypothyroidism   . Kidney stones    hx of  . Sleep apnea    Has CPAP machine, does not wear every night  . Thyroid disease     Past Surgical History:  Procedure Laterality Date  . AMPUTATION Right 01/25/2013   Procedure: RIGHT 1ST RAY AMPUTATION ;  Surgeon: Wylene Simmer, MD;  Location: [place];  Service: Orthopedics;  Laterality: Right;  . BACK SURGERY    . CERVICAL FUSION    . CHEILECTOMY Left 05/06/2016   Procedure: LEFT HALLUX METATARSAL PHALANGEAL JOINT CHEILECTOMY AND CARTIVA  RESURFACING;  Surgeon: Wylene Simmer, MD;  Location: [place];  Service: Orthopedics;  Laterality: Left;  . I & D EXTREMITY Right 01/25/2013   Procedure: IRRIGATION AND DEBRIDEMENT Right Hallux;  Surgeon: Wylene Simmer, MD;  Location: [place];  Service: Orthopedics;  Laterality: Right;  . KNEE SURGERY      There were no vitals filed for this visit.  Subjective Assessment - 01/31/20 0759    Subjective  Patient gets some relief with prone on elbows but doesn't last.    Pertinent History  Left L4/5 lumbar laminectomy, cervical surgery, DM, asthma, anxiety, OA    How  long can you walk comfortably?  1/2 mile    Diagnostic tests  MRI    Patient Stated Goals  Find relief    Currently in Pain?  Yes    Pain Score  7     Pain Location  Back    Pain Orientation  Lower    Pain Descriptors / Indicators  Sharp    Pain Type  Chronic pain                       [facility] Adult PT Treatment/Exercise - 01/31/20 0001      Exercises   Exercises  Lumbar      Lumbar Exercises: Stretches   Active Hamstring Stretch  Right;Left;30 seconds;2 reps    Active Hamstring Stretch Limitations  with strap    Single Knee to Chest Stretch  Right;Left;1 rep;20 seconds    Lower Trunk Rotation  5 reps;10 seconds    Lower Trunk Rotation Limitations  also did open book x 1 ea side    Quad Stretch  Right;Left;2 reps;30 seconds    Quad Stretch Limitations  prone with strap      Lumbar Exercises: Supine   Bridge  10 reps;5 seconds      Manual Therapy   Manual Therapy  Soft  tissue mobilization    Manual therapy comments  Skilled palpation and monitoring of soft tissues during DN    Soft tissue mobilization  to left gluteals and bil lumbar       Trigger Point Dry Needling - 01/31/20 0001    Consent Given?  Yes    Education Handout Provided  Previously provided    Muscles Treated Back/Hip  Gluteus minimus;Gluteus medius;Lumbar multifidi    Gluteus Minimus Response  Twitch response elicited;Palpable increased muscle length    Gluteus Medius Response  Twitch response elicited;Palpable increased muscle length    Lumbar multifidi Response  Twitch response elicited;Palpable increased muscle length   bil except Left L4/5 due to laminectomy            PT Short Term Goals - 01/24/20 2031      PT SHORT TERM GOAL #1   Title  Independent and compliant with initial HEP    Status  New    Target Date  02/07/20        PT Long Term Goals - 01/24/20 2032      PT LONG TERM GOAL #1   Title  Ind with HEP for flexibility and core strength    Status  New    Target  Date  03/06/20      PT LONG TERM GOAL #2   Title  Patient to report >= 50% decreased pain with ADLS    Status  New      PT LONG TERM GOAL #3   Title  Patient to report decreased pain in the morning by 50% or more.    Status  New            Plan - 01/31/20 0847    Clinical Impression Statement  Patient with very good response to DN today with signicant decrease in tissue tension in lumbar spine and reported pain decrease to 3/10 at end of session. LTGs are ongoing.    Comorbidities  lumbar laminectomy, cervical surgery, DM, asthma, anxiety, OA    PT Frequency  2x / week    PT Duration  6 weeks    PT Treatment/Interventions  ADLs/Self Care Home Management;Cryotherapy;Electrical Stimulation;Iontophoresis 4mg /ml Dexamethasone;Moist Heat;Traction;Therapeutic activities;Therapeutic exercise;Neuromuscular re-education;Manual techniques;Patient/family education;Dry needling;Spinal Manipulations;Taping    PT Next Visit Plan  lumbar ROM, HS/HS/quad flexibility, core and lumbar stab, assess DN to lumbar    PT Home Exercise Plan  [facility]    Consulted and Agree with Plan of Care  Patient       Patient will benefit from skilled therapeutic intervention in order to improve the following deficits and impairments:  Decreased range of motion, Increased muscle spasms, Decreased activity tolerance, Pain, Impaired flexibility, Postural dysfunction, Decreased strength  Visit Diagnosis: Chronic midline low back pain without sciatica     Problem List There are no problems to display for this patient.   Almyra Free Raylei Losurdo PT 01/31/2020, 3:01 PM  [facility] [address], [place], [place], [address] Phone: [phone]   Fax:  [phone]  Name: Trevor Grimes MRN: [id number] Date of Birth: Dec 17, 1950

## 2020-02-01 DIAGNOSIS — Z79891 Long term (current) use of opiate analgesic: Secondary | ICD-10-CM | POA: Diagnosis not present

## 2020-02-05 DIAGNOSIS — H2512 Age-related nuclear cataract, left eye: Secondary | ICD-10-CM | POA: Diagnosis not present

## 2020-02-05 DIAGNOSIS — H25042 Posterior subcapsular polar age-related cataract, left eye: Secondary | ICD-10-CM | POA: Diagnosis not present

## 2020-02-05 DIAGNOSIS — H25811 Combined forms of age-related cataract, right eye: Secondary | ICD-10-CM | POA: Diagnosis not present

## 2020-02-05 DIAGNOSIS — H25012 Cortical age-related cataract, left eye: Secondary | ICD-10-CM | POA: Diagnosis not present

## 2020-02-05 DIAGNOSIS — H2511 Age-related nuclear cataract, right eye: Secondary | ICD-10-CM | POA: Diagnosis not present

## 2020-02-06 ENCOUNTER — Ambulatory Visit: Payer: Medicare Other | Admitting: Physical Therapy

## 2020-02-06 DIAGNOSIS — M1712 Unilateral primary osteoarthritis, left knee: Secondary | ICD-10-CM | POA: Diagnosis not present

## 2020-02-06 DIAGNOSIS — M25462 Effusion, left knee: Secondary | ICD-10-CM | POA: Diagnosis not present

## 2020-02-06 DIAGNOSIS — M170 Bilateral primary osteoarthritis of knee: Secondary | ICD-10-CM | POA: Diagnosis not present

## 2020-02-06 DIAGNOSIS — M25561 Pain in right knee: Secondary | ICD-10-CM | POA: Diagnosis not present

## 2020-02-06 DIAGNOSIS — M25562 Pain in left knee: Secondary | ICD-10-CM | POA: Diagnosis not present

## 2020-02-07 DIAGNOSIS — M1711 Unilateral primary osteoarthritis, right knee: Secondary | ICD-10-CM | POA: Diagnosis not present

## 2020-02-07 DIAGNOSIS — M25561 Pain in right knee: Secondary | ICD-10-CM | POA: Diagnosis not present

## 2020-02-12 ENCOUNTER — Encounter: Payer: Medicare Other | Admitting: Physical Therapy

## 2020-02-12 DIAGNOSIS — H25812 Combined forms of age-related cataract, left eye: Secondary | ICD-10-CM | POA: Diagnosis not present

## 2020-02-12 DIAGNOSIS — H2512 Age-related nuclear cataract, left eye: Secondary | ICD-10-CM | POA: Diagnosis not present

## 2020-02-13 DIAGNOSIS — M1712 Unilateral primary osteoarthritis, left knee: Secondary | ICD-10-CM | POA: Diagnosis not present

## 2020-02-13 DIAGNOSIS — M25562 Pain in left knee: Secondary | ICD-10-CM | POA: Diagnosis not present

## 2020-02-14 ENCOUNTER — Encounter: Payer: Medicare Other | Admitting: Physical Therapy

## 2020-02-14 DIAGNOSIS — M1711 Unilateral primary osteoarthritis, right knee: Secondary | ICD-10-CM | POA: Diagnosis not present

## 2020-02-14 DIAGNOSIS — M25561 Pain in right knee: Secondary | ICD-10-CM | POA: Diagnosis not present

## 2020-02-14 DIAGNOSIS — Z23 Encounter for immunization: Secondary | ICD-10-CM | POA: Diagnosis not present

## 2020-02-20 ENCOUNTER — Encounter: Payer: Medicare Other | Admitting: Physical Therapy

## 2020-02-20 DIAGNOSIS — M1712 Unilateral primary osteoarthritis, left knee: Secondary | ICD-10-CM | POA: Diagnosis not present

## 2020-02-20 DIAGNOSIS — M25562 Pain in left knee: Secondary | ICD-10-CM | POA: Diagnosis not present

## 2020-02-21 DIAGNOSIS — M1711 Unilateral primary osteoarthritis, right knee: Secondary | ICD-10-CM | POA: Diagnosis not present

## 2020-02-21 DIAGNOSIS — M25561 Pain in right knee: Secondary | ICD-10-CM | POA: Diagnosis not present

## 2020-02-26 ENCOUNTER — Other Ambulatory Visit: Payer: Self-pay

## 2020-02-26 ENCOUNTER — Encounter: Payer: Self-pay | Admitting: Physical Therapy

## 2020-02-26 ENCOUNTER — Ambulatory Visit: Payer: Medicare Other | Attending: Neurosurgery | Admitting: Physical Therapy

## 2020-02-26 DIAGNOSIS — M25562 Pain in left knee: Secondary | ICD-10-CM | POA: Insufficient documentation

## 2020-02-26 DIAGNOSIS — M545 Low back pain, unspecified: Secondary | ICD-10-CM

## 2020-02-26 DIAGNOSIS — M25561 Pain in right knee: Secondary | ICD-10-CM | POA: Diagnosis not present

## 2020-02-26 DIAGNOSIS — G8929 Other chronic pain: Secondary | ICD-10-CM | POA: Insufficient documentation

## 2020-02-26 NOTE — Therapy (Signed)
[facility] [address] [place], [place], [address] Phone: [phone]   Fax:  [phone]  Physical Therapy Treatment  Patient Details  Name: Trevor Grimes MRN: [id number] Date of Birth: 01/18/1951 Referring Provider (PT): Sherlie Ban. Sterns    Encounter Date: 02/26/2020  PT End of Session - 02/26/20 1450    Visit Number  3    Date for PT Re-Evaluation  03/06/20    Authorization Type  Medicare    PT Start Time  1449    PT Stop Time  1535    PT Time Calculation (min)  46 min    Activity Tolerance  Patient tolerated treatment well    Behavior During Therapy  [facility] for tasks assessed/performed       Past Medical History:  Diagnosis Date  . Anxiety   . Arthritis   . Chronic back pain   . Depression   . Diabetes mellitus without complication ([place])   . Hypothyroidism   . Kidney stones    hx of  . Sleep apnea    Has CPAP machine, does not wear every night  . Thyroid disease     Past Surgical History:  Procedure Laterality Date  . AMPUTATION Right 01/25/2013   Procedure: RIGHT 1ST RAY AMPUTATION ;  Surgeon: Wylene Simmer, MD;  Location: [place];  Service: Orthopedics;  Laterality: Right;  . BACK SURGERY    . CERVICAL FUSION    . CHEILECTOMY Left 05/06/2016   Procedure: LEFT HALLUX METATARSAL PHALANGEAL JOINT CHEILECTOMY AND CARTIVA  RESURFACING;  Surgeon: Wylene Simmer, MD;  Location: [place];  Service: Orthopedics;  Laterality: Left;  . I & D EXTREMITY Right 01/25/2013   Procedure: IRRIGATION AND DEBRIDEMENT Right Hallux;  Surgeon: Wylene Simmer, MD;  Location: [place];  Service: Orthopedics;  Laterality: Right;  . KNEE SURGERY      There were no vitals filed for this visit.  Subjective Assessment - 02/26/20 1450    Subjective  Patient reports complete relief after DN for 2 days. He then had cataract sugery so hasn't returned until today. His back is really hurting and some into hip. DKTC and  SKTC feel good in the morning as does prone on elbows. This helps him to get his socks on.    Pertinent History  Left L4/5 lumbar laminectomy, cervical surgery, DM, asthma, anxiety, OA    Patient Stated Goals  Find relief    Currently in Pain?  Yes    Pain Score  8     Pain Location  Back    Pain Orientation  Lower    Pain Descriptors / Indicators  Sharp    Pain Type  Chronic pain                       [facility] Adult PT Treatment/Exercise - 02/26/20 0001      Self-Care   Self-Care  Other Self-Care Comments    Other Self-Care Comments   Self massage using foam roller standing on wall with minisquats and in supine on hard foam and softer foam for full back release; Use of ball for MFR in standing in low back and gluteals.       Lumbar Exercises: Quadruped   Plank  knees and elbows 10 sec hold x 5      Manual Therapy   Manual Therapy  Soft tissue mobilization    Manual therapy comments  Skilled palpation and monitoring of  soft tissues during DN    Soft tissue mobilization  to right gluteals and bil lumbar       Trigger Point Dry Needling - 02/26/20 0001    Consent Given?  Yes    Education Handout Provided  Previously provided    Muscles Treated Back/Hip  Gluteus minimus;Gluteus medius;Piriformis;Lumbar multifidi   right   Gluteus Minimus Response  Twitch response elicited;Palpable increased muscle length    Gluteus Medius Response  Twitch response elicited;Palpable increased muscle length    Piriformis Response  Twitch response elicited;Palpable increased muscle length    Lumbar multifidi Response  Twitch response elicited;Palpable increased muscle length   bil except Left L4/5 due to laminectomy          PT Education - 02/26/20 1542    Education Details  HEP progressed, TENS education    Person(s) Educated  Patient    Methods  Explanation;Demonstration;Handout    Comprehension  Verbalized understanding;Returned demonstration       PT Short Term Goals -  02/26/20 1506      PT SHORT TERM GOAL #1   Title  Independent and compliant with initial HEP    Status  Achieved        PT Long Term Goals - 02/26/20 1506      PT LONG TERM GOAL #1   Title  Ind with HEP for flexibility and core strength    Status  On-going      PT LONG TERM GOAL #2   Title  Patient to report >= 50% decreased pain with ADLS    Status  On-going      PT LONG TERM GOAL #3   Title  Patient to report decreased pain in the morning by 50% or more.    Status  On-going            Plan - 02/26/20 1543    Clinical Impression Statement  Patient presents today with 8/10 low back pain after 3 week absence from PT due to cataract surgery. He had 2 days of no pain following last session. Today we went over self massage techniques that he can use to complement manual therapy in the clinic to hopefully increase positive results. He had good response to DN/manual in the right gluteals and lumbar today and reported 0/10 pain at end of session. He was also provided with TENS info.    Comorbidities  lumbar laminectomy, cervical surgery, DM, asthma, anxiety, OA    PT Frequency  2x / week    PT Duration  6 weeks    PT Treatment/Interventions  ADLs/Self Care Home Management;Cryotherapy;Electrical Stimulation;Iontophoresis 4mg /ml Dexamethasone;Moist Heat;Traction;Therapeutic activities;Therapeutic exercise;Neuromuscular re-education;Manual techniques;Patient/family education;Dry needling;Spinal Manipulations;Taping    PT Next Visit Plan  assess DN response, continue with core strengthening and flexibility of hips/legs; trial of estim; Re-assessment by 03/06/20.    PT Home Exercise Plan  [facility]    Consulted and Agree with Plan of Care  Patient       Patient will benefit from skilled therapeutic intervention in order to improve the following deficits and impairments:  Decreased range of motion, Increased muscle spasms, Decreased activity tolerance, Pain, Impaired flexibility, Postural  dysfunction, Decreased strength  Visit Diagnosis: Chronic midline low back pain without sciatica     Problem List There are no problems to display for this patient.   Almyra Free Enzo Treu PT 02/26/2020, 3:53 PM  [facility] [address], [place], [place], [address] Phone: [phone]   Fax:  [phone]  Name: DEBRON FRITCH MRN: [id number] Date of Birth: [birth year]

## 2020-02-27 DIAGNOSIS — M1712 Unilateral primary osteoarthritis, left knee: Secondary | ICD-10-CM | POA: Diagnosis not present

## 2020-02-27 DIAGNOSIS — M25562 Pain in left knee: Secondary | ICD-10-CM | POA: Diagnosis not present

## 2020-02-27 DIAGNOSIS — G894 Chronic pain syndrome: Secondary | ICD-10-CM | POA: Diagnosis not present

## 2020-02-28 ENCOUNTER — Other Ambulatory Visit: Payer: Self-pay

## 2020-02-28 ENCOUNTER — Encounter: Payer: Self-pay | Admitting: Physical Therapy

## 2020-02-28 ENCOUNTER — Ambulatory Visit: Payer: Medicare Other | Admitting: Physical Therapy

## 2020-02-28 DIAGNOSIS — G8929 Other chronic pain: Secondary | ICD-10-CM | POA: Diagnosis not present

## 2020-02-28 DIAGNOSIS — M25561 Pain in right knee: Secondary | ICD-10-CM | POA: Diagnosis not present

## 2020-02-28 DIAGNOSIS — M545 Low back pain, unspecified: Secondary | ICD-10-CM

## 2020-02-28 DIAGNOSIS — M1711 Unilateral primary osteoarthritis, right knee: Secondary | ICD-10-CM | POA: Diagnosis not present

## 2020-02-28 DIAGNOSIS — M25562 Pain in left knee: Secondary | ICD-10-CM | POA: Diagnosis not present

## 2020-02-28 NOTE — Patient Instructions (Signed)
Access Code: [facility] URL: [email or web address] Date: 02/28/2020 Prepared by: Madelyn Flavors  Exercises Supine Bridge - 2 x daily - 7 x weekly - 10 reps - 1-2 sets - 5 sec hold Supine Lower Trunk Rotation - 1 x daily - 7 x weekly - 5 reps - 1 sets - 10 sec hold Supine Single Knee to Chest Stretch - 1 x daily - 7 x weekly - 5 reps - 1 sets - 20 sec hold Static Prone on Elbows - 2-3 x daily - 7 x weekly - 3 reps - 1 sets - 30-60 sec hold Plank on Knees - 1 x daily - 7 x weekly - 5 reps - 1 sets - max hold hold Supine 90/90 Alternating Toe Touch - 1 x daily - 7 x weekly - 3 sets - 10 reps Marching Bridge - 1 x daily - 7 x weekly - 10 reps - 3 sets  Patient Education Trigger Point Dry Needling TENS Unit

## 2020-02-28 NOTE — Therapy (Signed)
[facility] [address] [place], [place], [address] Phone: [phone]   Fax:  [phone]  Physical Therapy Treatment  Patient Details  Name: Trevor Grimes MRN: [id number] Date of Birth: 15-Oct-1951 Referring Provider (PT): Sherlie Ban. Sterns    Encounter Date: 02/28/2020  PT End of Session - 02/28/20 1015    Visit Number  4    Date for PT Re-Evaluation  03/06/20    Authorization Type  Medicare    PT Start Time  [id number]    PT Stop Time  1105    PT Time Calculation (min)  50 min    Activity Tolerance  Patient tolerated treatment well    Behavior During Therapy  [facility] for tasks assessed/performed       Past Medical History:  Diagnosis Date  . Anxiety   . Arthritis   . Chronic back pain   . Depression   . Diabetes mellitus without complication ([place])   . Hypothyroidism   . Kidney stones    hx of  . Sleep apnea    Has CPAP machine, does not wear every night  . Thyroid disease     Past Surgical History:  Procedure Laterality Date  . AMPUTATION Right 01/25/2013   Procedure: RIGHT 1ST RAY AMPUTATION ;  Surgeon: Wylene Simmer, MD;  Location: [place];  Service: Orthopedics;  Laterality: Right;  . BACK SURGERY    . CERVICAL FUSION    . CHEILECTOMY Left 05/06/2016   Procedure: LEFT HALLUX METATARSAL PHALANGEAL JOINT CHEILECTOMY AND CARTIVA  RESURFACING;  Surgeon: Wylene Simmer, MD;  Location: [place];  Service: Orthopedics;  Laterality: Left;  . I & D EXTREMITY Right 01/25/2013   Procedure: IRRIGATION AND DEBRIDEMENT Right Hallux;  Surgeon: Wylene Simmer, MD;  Location: [place];  Service: Orthopedics;  Laterality: Right;  . KNEE SURGERY      There were no vitals filed for this visit.  Subjective Assessment - 02/28/20 1015    Subjective  Patient reports relief since last visit. His pain is down to 4/10. Still feeling some on left.    Patient Stated Goals  Find relief    Currently in Pain?  Yes    Pain Score  4     Pain Location  Back                       [facility] Adult PT Treatment/Exercise - 02/28/20 0001      Lumbar Exercises: Aerobic   Nustep  L4 x 6 min      Lumbar Exercises: Supine   Bent Knee Raise  10 reps   bil 2x5    Bent Knee Raise Limitations  from 90/90 position    Bridge  10 reps;5 seconds    Bridge with clamshell  10 reps    Bridge with March  5 reps      Modalities   Modalities  Regulatory affairs officer Action  IFC    Electrical Stimulation Parameters  80-150 Hz x 15 min    Electrical Stimulation Goals  Pain      Manual Therapy   Manual Therapy  Soft tissue mobilization    Manual therapy comments  Skilled palpation and monitoring of soft tissues during DN    Soft tissue mobilization  to left gluteals       Trigger Point Dry  Needling - 02/28/20 0001    Consent Given?  Yes    Education Handout Provided  Previously provided    Muscles Treated Back/Hip  Gluteus minimus;Gluteus medius;Gluteus maximus;Piriformis    Dry Needling Comments  left    Gluteus Minimus Response  Twitch response elicited;Palpable increased muscle length    Gluteus Medius Response  Twitch response elicited;Palpable increased muscle length    Gluteus Maximus Response  Twitch response elicited;Palpable increased muscle length    Piriformis Response  Twitch response elicited;Palpable increased muscle length           PT Education - 02/28/20 1326    Education Details  HEP progressed    Person(s) Educated  Patient    Methods  Explanation;Demonstration;Handout    Comprehension  Verbalized understanding;Returned demonstration       PT Short Term Goals - 02/26/20 1506      PT SHORT TERM GOAL #1   Title  Independent and compliant with initial HEP    Status  Achieved        PT Long Term Goals - 02/26/20 1506      PT LONG TERM GOAL #1   Title  Ind with HEP  for flexibility and core strength    Status  On-going      PT LONG TERM GOAL #2   Title  Patient to report >= 50% decreased pain with ADLS    Status  On-going      PT LONG TERM GOAL #3   Title  Patient to report decreased pain in the morning by 50% or more.    Status  On-going            Plan - 02/28/20 1327    Clinical Impression Statement  Patient reports significant relief in back pain from last visit. He tolerated increased exercises today with reports of some pain increase at the end. He responded well to DN and manual therapy in the left gluteals today and to a trial of estim to the low back and gluteals.    Comorbidities  lumbar laminectomy, cervical surgery, DM, asthma, anxiety, OA    PT Treatment/Interventions  ADLs/Self Care Home Management;Cryotherapy;Electrical Stimulation;Iontophoresis 4mg /ml Dexamethasone;Moist Heat;Traction;Therapeutic activities;Therapeutic exercise;Neuromuscular re-education;Manual techniques;Patient/family education;Dry needling;Spinal Manipulations;Taping    PT Next Visit Plan  Re-assessment; assess estim response, continue with core strengthening and flexibility of hips/legs    PT Home Exercise Plan  [facility]       Patient will benefit from skilled therapeutic intervention in order to improve the following deficits and impairments:  Decreased range of motion, Increased muscle spasms, Decreased activity tolerance, Pain, Impaired flexibility, Postural dysfunction, Decreased strength  Visit Diagnosis: Chronic midline low back pain without sciatica     Problem List There are no problems to display for this patient.   Almyra Free Tramaine Sauls PT 02/28/2020, 1:33 PM   [facility] [address] [place], [place], [address] Phone: [phone]   Fax:  [phone]  Name: Trevor Grimes MRN: [id number] Date of Birth: [birth year]

## 2020-03-04 ENCOUNTER — Ambulatory Visit: Payer: Medicare Other | Admitting: Physical Therapy

## 2020-03-04 ENCOUNTER — Encounter: Payer: Self-pay | Admitting: Physical Therapy

## 2020-03-04 ENCOUNTER — Other Ambulatory Visit: Payer: Self-pay

## 2020-03-04 DIAGNOSIS — M25561 Pain in right knee: Secondary | ICD-10-CM

## 2020-03-04 DIAGNOSIS — G8929 Other chronic pain: Secondary | ICD-10-CM

## 2020-03-04 DIAGNOSIS — M545 Low back pain: Secondary | ICD-10-CM | POA: Diagnosis not present

## 2020-03-04 DIAGNOSIS — M25562 Pain in left knee: Secondary | ICD-10-CM | POA: Diagnosis not present

## 2020-03-04 DIAGNOSIS — M170 Bilateral primary osteoarthritis of knee: Secondary | ICD-10-CM | POA: Diagnosis not present

## 2020-03-04 NOTE — Patient Instructions (Signed)
Access Code: [facility] URL: [email or web address] Date: 03/04/2020 Prepared by: Madelyn Flavors  Exercises Supine Bridge - 2 x daily - 7 x weekly - 10 reps - 1-2 sets - 5 sec hold Supine Lower Trunk Rotation - 1 x daily - 7 x weekly - 5 reps - 1 sets - 10 sec hold Supine Single Knee to Chest Stretch - 1 x daily - 7 x weekly - 5 reps - 1 sets - 20 sec hold Static Prone on Elbows - 2-3 x daily - 7 x weekly - 3 reps - 1 sets - 30-60 sec hold Plank on Knees - 1 x daily - 7 x weekly - 5 reps - 1 sets - max hold hold Supine 90/90 Alternating Toe Touch - 1 x daily - 7 x weekly - 3 sets - 10 reps Marching Bridge - 1 x daily - 7 x weekly - 10 reps - 3 sets Prone Quadriceps Stretch with Strap - 2 x daily - 7 x weekly - 1 sets - 3 reps - 60 sec hold Supine Bilateral Hip Internal Rotation Stretch - 2 x daily - 7 x weekly - 1 sets - 3 reps - 30-60 sec hold  Patient Education Trigger Point Dry Needling TENS Unit

## 2020-03-04 NOTE — Therapy (Signed)
[facility] [address] [place], [place], [address] Phone: [phone]   Fax:  [phone]  Physical Therapy Treatment  Patient Details  Name: Trevor Grimes MRN: [id number] Date of Birth: 1951-03-27 Referring Provider (PT): Sherlie Ban. Sterns    Encounter Date: 03/04/2020  PT End of Session - 03/04/20 1015    Visit Number  5    Date for PT Re-Evaluation  04/15/20    Authorization Type  Medicare    Progress Note Due on Visit  10    PT Start Time  6203    PT Stop Time  1057    PT Time Calculation (min)  42 min    Activity Tolerance  Patient tolerated treatment well;Patient limited by pain    Behavior During Therapy  [facility] for tasks assessed/performed       Past Medical History:  Diagnosis Date  . Anxiety   . Arthritis   . Chronic back pain   . Depression   . Diabetes mellitus without complication ([place])   . Hypothyroidism   . Kidney stones    hx of  . Sleep apnea    Has CPAP machine, does not wear every night  . Thyroid disease     Past Surgical History:  Procedure Laterality Date  . AMPUTATION Right 01/25/2013   Procedure: RIGHT 1ST RAY AMPUTATION ;  Surgeon: Wylene Simmer, MD;  Location: [place];  Service: Orthopedics;  Laterality: Right;  . BACK SURGERY    . CERVICAL FUSION    . CHEILECTOMY Left 05/06/2016   Procedure: LEFT HALLUX METATARSAL PHALANGEAL JOINT CHEILECTOMY AND CARTIVA  RESURFACING;  Surgeon: Wylene Simmer, MD;  Location: [place];  Service: Orthopedics;  Laterality: Left;  . I & D EXTREMITY Right 01/25/2013   Procedure: IRRIGATION AND DEBRIDEMENT Right Hallux;  Surgeon: Wylene Simmer, MD;  Location: [place];  Service: Orthopedics;  Laterality: Right;  . KNEE SURGERY      There were no vitals filed for this visit.  Subjective Assessment - 03/04/20 1015    Subjective  patient reports making sleeping adjustments. He reports 10-20% improvement since eval. Able to do yard work  without increased pain.    Pertinent History  Left L4/5 lumbar laminectomy, cervical surgery, DM, asthma, anxiety, OA    Currently in Pain?  Yes    Pain Score  5     Pain Location  Back    Pain Orientation  Lower    Multiple Pain Sites  Yes    Pain Score  5    Pain Location  Knee    Pain Orientation  Right;Left;Lateral    Pain Descriptors / Indicators  Aching    Pain Type  Chronic pain    Pain Onset  More than a month ago    Pain Frequency  Intermittent    Aggravating Factors   bending, walking         [facility] PT Assessment - 03/04/20 0001      AROM   Overall AROM Comments  left knee 5-125 deg  Rt knee 7-124 deg; some tightness in hip internal rotators Left >Rt       Strength   Overall Strength Comments  bil knee ext 5/5, flex Rt 5/5, left 4+/5      Flexibility   Quadriceps  marked to 90 deg in prone                   [facility] Adult PT Treatment/Exercise -  03/04/20 0001      Lumbar Exercises: Stretches   Active Hamstring Stretch  Right;Left;2 reps;30 seconds    Active Hamstring Stretch Limitations  on steps    Quad Stretch  Right;Left;2 reps;30 seconds    Quad Stretch Limitations  with strap    Other Lumbar Stretch Exercise  supine hip IR stretch left x 30 seconds; attempted in sitting but cramped      Lumbar Exercises: Aerobic   Elliptical  L1 x 2 min   reports increased knee pain     Lumbar Exercises: Standing   Other Standing Lumbar Exercises  functional squat position: OH lift with weighted yellow ball; also chest press with yellow ball x 10 in squat position    Other Standing Lumbar Exercises  squat chops x 10 bil with weighted yellow ball   knees fatigued and some increased pain            PT Education - 03/04/20 1806    Education Details  HEP progressed    Person(s) Educated  Patient    Methods  Explanation;Demonstration;Handout    Comprehension  Verbalized understanding;Returned demonstration       PT Short Term Goals - 03/04/20 1019       PT SHORT TERM GOAL #1   Title  Independent and compliant with initial HEP    Status  Achieved        PT Long Term Goals - 03/04/20 1020      PT LONG TERM GOAL #1   Title  Ind with HEP for flexibility and core strength    Status  Partially Met      PT LONG TERM GOAL #2   Title  Patient to report >= 50% decreased pain with ADLS    Baseline  01%    Status  On-going      PT LONG TERM GOAL #3   Title  Patient to report decreased pain in the morning by 50% or more.    Baseline  30% improvement today    Status  On-going            Plan - 03/04/20 1807    Clinical Impression Statement  Patient is continuing to make progress with decreasing back pain. He reports 20% improvement in pain with ADLS and 09% decrease of pain in the morning. He is semi-compliant with HEP and also active doing yard work. He complains intermittently of bil knee pain when performing some therapeutic exercise for his back. Upon re-assessment today, he has continued flexibilty deficits affecting knee and hip ROM and also has mild weakness in knee flexion. He has responded well to DN and manual therapy in the low back and gluteals and I feel his knees would benefit from manual therapy as well and allow progression of low back exercises. His long term goals are ongoing.    Comorbidities  lumbar laminectomy, cervical surgery, DM, asthma, anxiety, OA    PT Frequency  2x / week    PT Duration  6 weeks    PT Treatment/Interventions  ADLs/Self Care Home Management;Cryotherapy;Electrical Stimulation;Iontophoresis '4mg'$ /ml Dexamethasone;Moist Heat;Traction;Therapeutic activities;Therapeutic exercise;Neuromuscular re-education;Manual techniques;Patient/family education;Dry needling;Spinal Manipulations;Taping;Joint Manipulations    PT Next Visit Plan  Add manual therapy/DN for knee pain; progress standing core and functional activites for back and leg strengthening    PT Home Exercise Plan  [facility]       Patient will  benefit from skilled therapeutic intervention in order to improve the following deficits and impairments:  Decreased range of  motion, Increased muscle spasms, Decreased activity tolerance, Pain, Impaired flexibility, Postural dysfunction, Decreased strength  Visit Diagnosis: Chronic midline low back pain without sciatica - Plan: PT plan of care cert/re-cert  Chronic pain of right knee - Plan: PT plan of care cert/re-cert  Chronic pain of left knee - Plan: PT plan of care cert/re-cert     Problem List There are no problems to display for this patient.  Almyra Free Shayli Altemose PT 03/04/2020, 6:27 PM  [facility] [address] [place], [place], [address] Phone: [phone]   Fax:  [phone]  Name: Trevor Grimes MRN: [id number] Date of Birth: 03/31/1951

## 2020-03-06 ENCOUNTER — Encounter: Payer: Medicare Other | Admitting: Physical Therapy

## 2020-03-07 DIAGNOSIS — M549 Dorsalgia, unspecified: Secondary | ICD-10-CM | POA: Diagnosis not present

## 2020-03-07 DIAGNOSIS — R0600 Dyspnea, unspecified: Secondary | ICD-10-CM | POA: Diagnosis not present

## 2020-03-07 DIAGNOSIS — R5383 Other fatigue: Secondary | ICD-10-CM | POA: Diagnosis not present

## 2020-03-07 DIAGNOSIS — E7800 Pure hypercholesterolemia, unspecified: Secondary | ICD-10-CM | POA: Diagnosis not present

## 2020-03-07 DIAGNOSIS — E039 Hypothyroidism, unspecified: Secondary | ICD-10-CM | POA: Diagnosis not present

## 2020-03-07 DIAGNOSIS — D7589 Other specified diseases of blood and blood-forming organs: Secondary | ICD-10-CM | POA: Diagnosis not present

## 2020-03-07 DIAGNOSIS — Z01818 Encounter for other preprocedural examination: Secondary | ICD-10-CM | POA: Diagnosis not present

## 2020-03-11 ENCOUNTER — Other Ambulatory Visit: Payer: Self-pay

## 2020-03-11 ENCOUNTER — Ambulatory Visit: Payer: Medicare Other | Admitting: Physical Therapy

## 2020-03-11 ENCOUNTER — Encounter: Payer: Self-pay | Admitting: Physical Therapy

## 2020-03-11 DIAGNOSIS — E559 Vitamin D deficiency, unspecified: Secondary | ICD-10-CM | POA: Diagnosis not present

## 2020-03-11 DIAGNOSIS — E785 Hyperlipidemia, unspecified: Secondary | ICD-10-CM | POA: Diagnosis not present

## 2020-03-11 DIAGNOSIS — M25561 Pain in right knee: Secondary | ICD-10-CM | POA: Diagnosis not present

## 2020-03-11 DIAGNOSIS — M25562 Pain in left knee: Secondary | ICD-10-CM | POA: Diagnosis not present

## 2020-03-11 DIAGNOSIS — E119 Type 2 diabetes mellitus without complications: Secondary | ICD-10-CM | POA: Diagnosis not present

## 2020-03-11 DIAGNOSIS — G8929 Other chronic pain: Secondary | ICD-10-CM

## 2020-03-11 DIAGNOSIS — M545 Low back pain: Secondary | ICD-10-CM | POA: Diagnosis not present

## 2020-03-11 NOTE — Therapy (Signed)
[facility] [address], [place], [place], [address] Phone: [phone]   Fax:  [phone]  Physical Therapy Treatment  Patient Details  Name: Trevor Grimes MRN: [id number] Date of Birth: 08/17/1951 Referring Provider (PT): Sherlie Ban. Sterns    Encounter Date: 03/11/2020  PT End of Session - 03/11/20 1021    Visit Number  6    Date for PT Re-Evaluation  04/15/20    Authorization Type  Medicare    PT Start Time  1019    PT Stop Time  1100    PT Time Calculation (min)  41 min    Activity Tolerance  Patient tolerated treatment well;Patient limited by pain    Behavior During Therapy  [facility] for tasks assessed/performed       Past Medical History:  Diagnosis Date  . Anxiety   . Arthritis   . Chronic back pain   . Depression   . Diabetes mellitus without complication ([place])   . Hypothyroidism   . Kidney stones    hx of  . Sleep apnea    Has CPAP machine, does not wear every night  . Thyroid disease     Past Surgical History:  Procedure Laterality Date  . AMPUTATION Right 01/25/2013   Procedure: RIGHT 1ST RAY AMPUTATION ;  Surgeon: Wylene Simmer, MD;  Location: [place];  Service: Orthopedics;  Laterality: Right;  . BACK SURGERY    . CERVICAL FUSION    . CHEILECTOMY Left 05/06/2016   Procedure: LEFT HALLUX METATARSAL PHALANGEAL JOINT CHEILECTOMY AND CARTIVA  RESURFACING;  Surgeon: Wylene Simmer, MD;  Location: [place];  Service: Orthopedics;  Laterality: Left;  . I & D EXTREMITY Right 01/25/2013   Procedure: IRRIGATION AND DEBRIDEMENT Right Hallux;  Surgeon: Wylene Simmer, MD;  Location: [place];  Service: Orthopedics;  Laterality: Right;  . KNEE SURGERY      There were no vitals filed for this visit.  Subjective Assessment - 03/11/20 1021    Subjective  exercising easier, sleeping is better, able to stand and put on socks. Patient having knee replacement on right first.    Pertinent History   Left L4/5 lumbar laminectomy, cervical surgery, DM, asthma, anxiety, OA    Currently in Pain?  Yes    Pain Score  2     Pain Location  Back    Pain Orientation  Lower    Pain Descriptors / Indicators  Sharp    Pain Type  Chronic pain                       [facility] Adult PT Treatment/Exercise - 03/11/20 0001      Lumbar Exercises: Stretches   Figure 4 Stretch  4 reps;30 seconds;Seated      Lumbar Exercises: Aerobic   Elliptical  L1 x 2 min   reports increased knee pain     Lumbar Exercises: Standing   Functional Squats  10 reps    Functional Squats Limitations  8# bil     Other Standing Lumbar Exercises  functional squat position at wall with good morning x 10 no weight; then away from wall with OH arms 2x10 sec hold and arms back 2x 10 sec    Other Standing Lumbar Exercises  attempted but had to stop due to knee pain      Manual Therapy   Manual Therapy  Soft tissue mobilization;Myofascial release    Manual therapy comments  Skilled  palpation and monitoring of soft tissues during DN    Soft tissue mobilization  to bil gluteals and lateral quads    Myofascial Release  to bil ITB       Trigger Point Dry Needling - 03/11/20 0001    Education Handout Provided  Previously provided    Muscles Treated Lower Quadrant  Vastus lateralis    Muscles Treated Back/Hip  Gluteus minimus;Gluteus medius;Piriformis    Dry Needling Comments  bil    Vastus lateralis Response  Twitch response elicited;Palpable increased muscle length    Gluteus Minimus Response  Twitch response elicited;Palpable increased muscle length    Gluteus Medius Response  Twitch response elicited;Palpable increased muscle length    Piriformis Response  Twitch response elicited;Palpable increased muscle length             PT Short Term Goals - 03/04/20 1019      PT SHORT TERM GOAL #1   Title  Independent and compliant with initial HEP    Status  Achieved        PT Long Term Goals - 03/04/20  1020      PT LONG TERM GOAL #1   Title  Ind with HEP for flexibility and core strength    Status  Partially Met      PT LONG TERM GOAL #2   Title  Patient to report >= 50% decreased pain with ADLS    Baseline  49%    Status  On-going      PT LONG TERM GOAL #3   Title  Patient to report decreased pain in the morning by 50% or more.    Baseline  30% improvement today    Status  On-going            Plan - 03/11/20 1702    Clinical Impression Statement  Patient reports he saw Dr. Noemi Chapel and will be having a right TKA soon. Patient reporting overall improvements in low back especially with sleeping, exericising and donning socks. Knee pain was limiting for TE today. He responded well to DN and STW to bil quadriceps as well as gluteals. He reported decreased pain and increased flexibility at the end of treatment. LTGs are ongoing.    Comorbidities  lumbar laminectomy, cervical surgery, DM, asthma, anxiety, OA    PT Frequency  2x / week    PT Duration  6 weeks    PT Treatment/Interventions  ADLs/Self Care Home Management;Cryotherapy;Electrical Stimulation;Iontophoresis 42m/ml Dexamethasone;Moist Heat;Traction;Therapeutic activities;Therapeutic exercise;Neuromuscular re-education;Manual techniques;Patient/family education;Dry needling;Spinal Manipulations;Taping;Joint Manipulations    PT Next Visit Plan  Assess manual therapy/DN for knee pain; progress standing core and functional activites for back and leg strengthening    PT [facility]  [facility]      Patient will benefit from skilled therapeutic intervention in order to improve the following deficits and impairments:  Decreased range of motion, Increased muscle spasms, Decreased activity tolerance, Pain, Impaired flexibility, Postural dysfunction, Decreased strength  Visit Diagnosis: Chronic midline low back pain without sciatica  Chronic pain of right knee  Chronic pain of left knee     Problem List There are no  problems to display for this patient.   JAlmyra FreeRiddles PT 03/11/2020, 5:06 PM  [facility] [address] [place] [address]: [phone]  Fax:  [phone] Name: Trevor MONDOMRN: [id number] of Birth: 321-Feb-1952

## 2020-03-13 ENCOUNTER — Encounter: Payer: Self-pay | Admitting: Cardiology

## 2020-03-13 ENCOUNTER — Ambulatory Visit (INDEPENDENT_AMBULATORY_CARE_PROVIDER_SITE_OTHER): Payer: Medicare Other | Admitting: Cardiology

## 2020-03-13 ENCOUNTER — Encounter: Payer: Medicare Other | Admitting: Physical Therapy

## 2020-03-13 ENCOUNTER — Encounter: Payer: Self-pay | Admitting: *Deleted

## 2020-03-13 ENCOUNTER — Other Ambulatory Visit: Payer: Self-pay

## 2020-03-13 VITALS — BP 112/76 | HR 74 | Wt 202.0 lb

## 2020-03-13 DIAGNOSIS — R0602 Shortness of breath: Secondary | ICD-10-CM | POA: Insufficient documentation

## 2020-03-13 DIAGNOSIS — E0800 Diabetes mellitus due to underlying condition with hyperosmolarity without nonketotic hyperglycemic-hyperosmolar coma (NKHHC): Secondary | ICD-10-CM

## 2020-03-13 DIAGNOSIS — Z01810 Encounter for preprocedural cardiovascular examination: Secondary | ICD-10-CM | POA: Diagnosis not present

## 2020-03-13 NOTE — Patient Instructions (Signed)
Medication Instructions:  Your physician recommends that you continue on your current medications as directed. Please refer to the Current Medication list given to you today.  *If you need a refill on your cardiac medications before your next appointment, please call your pharmacy*   Lab Work: None ordered  If you have labs (blood work) drawn today and your tests are completely normal, you will receive your results only by: Marland Kitchen MyChart Message (if you have MyChart) OR . A paper copy in the mail If you have any lab test that is abnormal or we need to change your treatment, we will call you to review the results.   Testing/Procedures: Your physician has requested that you have a lexiscan myoview. For further information please visit [email or web address]. Please follow instruction sheet, as given.     Follow-Up: At [facility], you and your health needs are our priority.  As part of our continuing mission to provide you with exceptional heart care, we have created designated Provider Care Teams.  These Care Teams include your primary Cardiologist (physician) and Advanced Practice Providers (APPs -  Physician Assistants and Nurse Practitioners) who all work together to provide you with the care you need, when you need it.  We recommend signing up for the patient portal called "MyChart".  Sign up information is provided on this After Visit Summary.  MyChart is used to connect with patients for Virtual Visits (Telemedicine).  Patients are able to view lab/test results, encounter notes, upcoming appointments, etc.  Non-urgent messages can be sent to your provider as well.   To learn more about what you can do with MyChart, go to [email or web address].    Your next appointment:   3 month(s)  The format for your next appointment:   In Person  Provider:   Berniece Salines, DO   Other Instructions  Cardiac Nuclear Scan A cardiac nuclear scan is a test that is done to check the flow of blood  to your heart. It is done when you are resting and when you are exercising. The test looks for problems such as:  Not enough blood reaching a portion of the heart.  The heart muscle not working as it should. You may need this test if:  You have heart disease.  You have had lab results that are not normal.  You have had heart surgery or a balloon procedure to open up blocked arteries (angioplasty).  You have chest pain.  You have shortness of breath. In this test, a special dye (tracer) is put into your bloodstream. The tracer will travel to your heart. A camera will then take pictures of your heart to see how the tracer moves through your heart. This test is usually done at a hospital and takes 2-4 hours. Tell a doctor about:  Any allergies you have.  All medicines you are taking, including vitamins, herbs, eye drops, creams, and over-the-counter medicines.  Any problems you or family members have had with anesthetic medicines.  Any blood disorders you have.  Any surgeries you have had.  Any medical conditions you have.  Whether you are pregnant or may be pregnant. What are the risks? Generally, this is a safe test. However, problems may occur, such as:  Serious chest pain and heart attack. This is only a risk if the stress portion of the test is done.  Rapid heartbeat.  A feeling of warmth in your chest. This feeling usually does not last long.  Allergic reaction to the tracer. What happens  before the test?  Ask your doctor about changing or stopping your normal medicines. This is important.  Follow instructions from your doctor about what you cannot eat or drink.  Remove your jewelry on the day of the test. What happens during the test?  An IV tube will be inserted into one of your veins.  Your doctor will give you a small amount of tracer through the IV tube.  You will wait for 20-40 minutes while the tracer moves through your bloodstream.  Your heart will  be monitored with an electrocardiogram (ECG).  You will lie down on an exam table.  Pictures of your heart will be taken for about 15-20 minutes.  You may also have a stress test. For this test, one of these things may be done: ? You will be asked to exercise on a treadmill or a stationary bike. ? You will be given medicines that will make your heart work harder. This is done if you are unable to exercise.  When blood flow to your heart has peaked, a tracer will again be given through the IV tube.  After 20-40 minutes, you will get back on the exam table. More pictures will be taken of your heart.  Depending on the tracer that is used, more pictures may need to be taken 3-4 hours later.  Your IV tube will be removed when the test is over. The test may vary among doctors and hospitals. What happens after the test?  Ask your doctor: ? Whether you can return to your normal schedule, including diet, activities, and medicines. ? Whether you should drink more fluids. This will help to remove the tracer from your body. Drink enough fluid to keep your pee (urine) pale yellow.  Ask your doctor, or the department that is doing the test: ? When will my results be ready? ? How will I get my results? Summary  A cardiac nuclear scan is a test that is done to check the flow of blood to your heart.  Tell your doctor whether you are pregnant or may be pregnant.  Before the test, ask your doctor about changing or stopping your normal medicines. This is important.  Ask your doctor whether you can return to your normal activities. You may be asked to drink more fluids. This information is not intended to replace advice given to you by your health care provider. Make sure you discuss any questions you have with your health care provider. Document Revised: 02/28/2019 Document Reviewed: 04/24/2018 Elsevier Patient Education  [place].

## 2020-03-13 NOTE — Progress Notes (Signed)
Cardiology Office Note:    Date:  03/13/2020   ID:  Trevor Grimes, DOB [birth year], MRN [id number]  PCP:  Leighton Ruff, MD  Cardiologist:  Berniece Salines, DO  Electrophysiologist:  None   Referring MD: Leighton Ruff, MD   Chief Complaint  Patient presents with  . New Patient (Initial Visit)  . Pre-op Exam    History of Present Illness:    Trevor Grimes is a 69 y.o. male with a hx of diabetes,, OSA on CPAP hypothyroidism presents today in a for preoperative clearance.   Past Medical History:  Diagnosis Date  . Anxiety   . Arthritis   . Chronic back pain   . Depression   . Diabetes mellitus without complication ([place])   . Hypothyroidism   . Kidney stones    hx of  . Sleep apnea    Has CPAP machine, does not wear every night  . Thyroid disease     Past Surgical History:  Procedure Laterality Date  . AMPUTATION Right 01/25/2013   Procedure: RIGHT 1ST RAY AMPUTATION ;  Surgeon: Wylene Simmer, MD;  Location: [place];  Service: Orthopedics;  Laterality: Right;  . BACK SURGERY    . CERVICAL FUSION    . CHEILECTOMY Left 05/06/2016   Procedure: LEFT HALLUX METATARSAL PHALANGEAL JOINT CHEILECTOMY AND CARTIVA  RESURFACING;  Surgeon: Wylene Simmer, MD;  Location: [place];  Service: Orthopedics;  Laterality: Left;  . I & D EXTREMITY Right 01/25/2013   Procedure: IRRIGATION AND DEBRIDEMENT Right Hallux;  Surgeon: Wylene Simmer, MD;  Location: [place];  Service: Orthopedics;  Laterality: Right;  . KNEE SURGERY      Current Medications: Current Meds  Medication Sig  . ACCU-CHEK SMARTVIEW test strip   . atorvastatin (LIPITOR) 20 MG tablet atorvastatin 20 mg tablet  . cyclobenzaprine (FLEXERIL) 10 MG tablet cyclobenzaprine 10 mg tablet  . glipiZIDE (GLUCOTROL) 5 MG tablet glipizide 5 mg tablet  . HYDROcodone-acetaminophen (NORCO/VICODIN) 5-325 MG tablet Take 1 tablet by mouth every 6 (six) hours as needed for moderate pain.  Marland Kitchen levothyroxine (SYNTHROID, LEVOTHROID)  25 MCG tablet Take 25 mcg by mouth daily.  . meclizine (ANTIVERT) 25 MG tablet Take 1 tablet (25 mg total) by mouth 3 (three) times daily as needed for dizziness.  . meloxicam (MOBIC) 15 MG tablet meloxicam 15 mg tablet  . metFORMIN (GLUCOPHAGE) 1000 MG tablet Take 1,000 mg by mouth daily with breakfast.  . Multiple Vitamin (MULTIVITAMIN WITH MINERALS) TABS Take 1 tablet by mouth daily.  . OPTIVE 0.5-0.9 % ophthalmic solution 1 drop 2 (two) times daily.  Marland Kitchen senna (SENOKOT) 8.6 MG TABS tablet Take 2 tablets (17.2 mg total) by mouth 2 (two) times daily.  . sertraline (ZOLOFT) 50 MG tablet Take 50 mg by mouth daily.     Allergies:   Patient has no known allergies.   Social History   Socioeconomic History  . Marital status: Married    Spouse name: Not on file  . Number of children: Not on file  . Years of education: Not on file  . Highest education level: Not on file  Occupational History  . Not on file  Tobacco Use  . Smoking status: Never Smoker  . Smokeless tobacco: Never Used  Substance and Sexual Activity  . Alcohol use: Yes    Comment: "occas"  . Drug use: No  . Sexual activity: Not on file  Other Topics Concern  . Not on file  Social History Narrative  .  Not on file   Social Determinants of Health   Financial Resource Strain:   . Difficulty of Paying Living Expenses:   Food Insecurity:   . Worried About Charity fundraiser in the Last Year:   . Arboriculturist in the Last Year:   Transportation Needs:   . Film/video editor (Medical):   Marland Kitchen Lack of Transportation (Non-Medical):   Physical Activity:   . Days of Exercise per Week:   . Minutes of Exercise per Session:   Stress:   . Feeling of Stress :   Social Connections:   . Frequency of Communication with Friends and Family:   . Frequency of Social Gatherings with Friends and Family:   . Attends Religious Services:   . Active Member of Clubs or Organizations:   . Attends Archivist Meetings:   Marland Kitchen  Marital Status:      Family History: The patient's family history is not on file.  ROS:   Review of Systems  Constitution: Negative for decreased appetite, fever and weight gain.  HENT: Negative for congestion, ear discharge, hoarse voice and sore throat.   Eyes: Negative for discharge, redness, vision loss in right eye and visual halos.  Cardiovascular: Negative for chest pain, dyspnea on exertion, leg swelling, orthopnea and palpitations.  Respiratory: Negative for cough, hemoptysis, shortness of breath and snoring.   Endocrine: Negative for heat intolerance and polyphagia.  Hematologic/Lymphatic: Negative for bleeding problem. Does not bruise/bleed easily.  Skin: Negative for flushing, nail changes, rash and suspicious lesions.  Musculoskeletal: Negative for arthritis, joint pain, muscle cramps, myalgias, neck pain and stiffness.  Gastrointestinal: Negative for abdominal pain, bowel incontinence, diarrhea and excessive appetite.  Genitourinary: Negative for decreased libido, genital sores and incomplete emptying.  Neurological: Negative for brief paralysis, focal weakness, headaches and loss of balance.  Psychiatric/Behavioral: Negative for altered mental status, depression and suicidal ideas.  Allergic/Immunologic: Negative for HIV exposure and persistent infections.    EKGs/Labs/Other Studies Reviewed:    The following studies were reviewed today:   EKG:  The ekg ordered today demonstrates sinus rhythm, heart rate 74 bpm.   Recent Labs: No results found for requested labs within last 8760 hours.  Recent Lipid Panel No results found for: CHOL, TRIG, HDL, CHOLHDL, VLDL, LDLCALC, LDLDIRECT  Physical Exam:    VS:  BP 112/76   Pulse 74   Wt 202 lb (91.6 kg)   SpO2 98%   BMI 28.98 kg/m     Wt Readings from Last 3 Encounters:  03/13/20 202 lb (91.6 kg)  10/18/16 190 lb (86.2 kg)  05/06/16 196 lb (88.9 kg)     GEN: Well nourished, well developed in no acute  distress HEENT: Normal NECK: No JVD; No carotid bruits LYMPHATICS: No lymphadenopathy CARDIAC: S1S2 noted,RRR, no murmurs, rubs, gallops RESPIRATORY:  Clear to auscultation without rales, wheezing or rhonchi  ABDOMEN: Soft, non-tender, non-distended, +bowel sounds, no guarding. EXTREMITIES: No edema, No cyanosis, no clubbing MUSCULOSKELETAL:  No deformity  SKIN: Warm and dry NEUROLOGIC:  Alert and oriented x 3, non-focal PSYCHIATRIC:  Normal affect, good insight  ASSESSMENT:    1. Preoperative cardiovascular examination   2. Diabetes mellitus due to underlying condition with hyperosmolarity without coma, without long-term current use of insulin (HCC)    PLAN:     Given his risk factors I like to pursue preoperative stress test.  Have educated patient about pharmacologic nuclear stress test.  He is unable to perform exercise nuclear stress  test due to bilateral knee pain and pending surgery.  He is agreeable to proceed with this testing.  If his stress test is normal the patient can proceed with his bilateral knee surgery.  Diabetes mellitus-Per primary doctor.  Obstructive sleep apnea-continue his CPAP.  The patient is in agreement with the above plan. The patient left the office in stable condition.  The patient will follow up in 3 months or sooner if needed   Medication Adjustments/Labs and Tests Ordered: Current medicines are reviewed at length with the patient today.  Concerns regarding medicines are outlined above.  Orders Placed This Encounter  Procedures  . MYOCARDIAL PERFUSION IMAGING  . EKG 12-Lead   No orders of the defined types were placed in this encounter.   Patient Instructions  Medication Instructions:  Your physician recommends that you continue on your current medications as directed. Please refer to the Current Medication list given to you today.  *If you need a refill on your cardiac medications before your next appointment, please call your  pharmacy*   Lab Work: None ordered  If you have labs (blood work) drawn today and your tests are completely normal, you will receive your results only by: Marland Kitchen MyChart Message (if you have MyChart) OR . A paper copy in the mail If you have any lab test that is abnormal or we need to change your treatment, we will call you to review the results.   Testing/Procedures: Your physician has requested that you have a lexiscan myoview. For further information please visit [email or web address]. Please follow instruction sheet, as given.     Follow-Up: At [facility], you and your health needs are our priority.  As part of our continuing mission to provide you with exceptional heart care, we have created designated Provider Care Teams.  These Care Teams include your primary Cardiologist (physician) and Advanced Practice Providers (APPs -  Physician Assistants and Nurse Practitioners) who all work together to provide you with the care you need, when you need it.  We recommend signing up for the patient portal called "MyChart".  Sign up information is provided on this After Visit Summary.  MyChart is used to connect with patients for Virtual Visits (Telemedicine).  Patients are able to view lab/test results, encounter notes, upcoming appointments, etc.  Non-urgent messages can be sent to your provider as well.   To learn more about what you can do with MyChart, go to [email or web address].    Your next appointment:   3 month(s)  The format for your next appointment:   In Person  Provider:   Berniece Salines, DO   Other Instructions  Cardiac Nuclear Scan A cardiac nuclear scan is a test that is done to check the flow of blood to your heart. It is done when you are resting and when you are exercising. The test looks for problems such as:  Not enough blood reaching a portion of the heart.  The heart muscle not working as it should. You may need this test if:  You have heart disease.  You  have had lab results that are not normal.  You have had heart surgery or a balloon procedure to open up blocked arteries (angioplasty).  You have chest pain.  You have shortness of breath. In this test, a special dye (tracer) is put into your bloodstream. The tracer will travel to your heart. A camera will then take pictures of your heart to see how the tracer moves through your heart. This test is  usually done at a hospital and takes 2-4 hours. Tell a doctor about:  Any allergies you have.  All medicines you are taking, including vitamins, herbs, eye drops, creams, and over-the-counter medicines.  Any problems you or family members have had with anesthetic medicines.  Any blood disorders you have.  Any surgeries you have had.  Any medical conditions you have.  Whether you are pregnant or may be pregnant. What are the risks? Generally, this is a safe test. However, problems may occur, such as:  Serious chest pain and heart attack. This is only a risk if the stress portion of the test is done.  Rapid heartbeat.  A feeling of warmth in your chest. This feeling usually does not last long.  Allergic reaction to the tracer. What happens before the test?  Ask your doctor about changing or stopping your normal medicines. This is important.  Follow instructions from your doctor about what you cannot eat or drink.  Remove your jewelry on the day of the test. What happens during the test?  An IV tube will be inserted into one of your veins.  Your doctor will give you a small amount of tracer through the IV tube.  You will wait for 20-40 minutes while the tracer moves through your bloodstream.  Your heart will be monitored with an electrocardiogram (ECG).  You will lie down on an exam table.  Pictures of your heart will be taken for about 15-20 minutes.  You may also have a stress test. For this test, one of these things may be done: ? You will be asked to exercise on a  treadmill or a stationary bike. ? You will be given medicines that will make your heart work harder. This is done if you are unable to exercise.  When blood flow to your heart has peaked, a tracer will again be given through the IV tube.  After 20-40 minutes, you will get back on the exam table. More pictures will be taken of your heart.  Depending on the tracer that is used, more pictures may need to be taken 3-4 hours later.  Your IV tube will be removed when the test is over. The test may vary among doctors and hospitals. What happens after the test?  Ask your doctor: ? Whether you can return to your normal schedule, including diet, activities, and medicines. ? Whether you should drink more fluids. This will help to remove the tracer from your body. Drink enough fluid to keep your pee (urine) pale yellow.  Ask your doctor, or the department that is doing the test: ? When will my results be ready? ? How will I get my results? Summary  A cardiac nuclear scan is a test that is done to check the flow of blood to your heart.  Tell your doctor whether you are pregnant or may be pregnant.  Before the test, ask your doctor about changing or stopping your normal medicines. This is important.  Ask your doctor whether you can return to your normal activities. You may be asked to drink more fluids. This information is not intended to replace advice given to you by your health care provider. Make sure you discuss any questions you have with your health care provider. Document Revised: 02/28/2019 Document Reviewed: 04/24/2018 Elsevier Patient Education  [place].      Adopting a Healthy Lifestyle.  Know what a healthy weight is for you (roughly BMI <25) and aim to maintain this  Aim for 7+ servings of fruits and vegetables daily   65-80+ fluid ounces of water or unsweet tea for healthy kidneys   Limit to max 1 drink of alcohol per day; avoid smoking/tobacco   Limit  animal fats in diet for cholesterol and heart health - choose grass fed whenever available   Avoid highly processed foods, and foods high in saturated/trans fats   Aim for low stress - take time to unwind and care for your mental health   Aim for 150 min of moderate intensity exercise weekly for heart health, and weights twice weekly for bone health   Aim for 7-9 hours of sleep daily   When it comes to diets, agreement about the perfect plan isnt easy to find, even among the experts. Experts at the [employer] developed an idea known as the Healthy Eating Plate. Just imagine a plate divided into logical, healthy portions.   The emphasis is on diet quality:   Load up on vegetables and fruits - one-half of your plate: Aim for color and variety, and remember that potatoes dont count.   Go for whole grains - one-quarter of your plate: Whole wheat, barley, wheat berries, quinoa, oats, brown rice, and foods made with them. If you want pasta, go with whole wheat pasta.   Protein power - one-quarter of your plate: Fish, chicken, beans, and nuts are all healthy, versatile protein sources. Limit red meat.   The diet, however, does go beyond the plate, offering a few other suggestions.   Use healthy plant oils, such as olive, canola, soy, corn, sunflower and peanut. Check the labels, and avoid partially hydrogenated oil, which have unhealthy trans fats.   If youre thirsty, drink water. Coffee and tea are good in moderation, but skip sugary drinks and limit milk and dairy products to one or two daily servings.   The type of carbohydrate in the diet is more important than the amount. Some sources of carbohydrates, such as vegetables, fruits, whole grains, and beans-are healthier than others.   Finally, stay active  Signed, Berniece Salines, DO  03/13/2020 3:52 PM    [facility]

## 2020-03-15 ENCOUNTER — Emergency Department (HOSPITAL_COMMUNITY): Payer: Medicare Other

## 2020-03-15 ENCOUNTER — Encounter (HOSPITAL_COMMUNITY): Payer: Self-pay | Admitting: Emergency Medicine

## 2020-03-15 ENCOUNTER — Other Ambulatory Visit: Payer: Self-pay

## 2020-03-15 ENCOUNTER — Emergency Department (HOSPITAL_COMMUNITY)
Admission: EM | Admit: 2020-03-15 | Discharge: 2020-03-16 | Disposition: A | Discharge status: Home / Self Care | Payer: Medicare Other | Attending: Emergency Medicine | Admitting: Emergency Medicine

## 2020-03-15 DIAGNOSIS — E119 Type 2 diabetes mellitus without complications: Secondary | ICD-10-CM | POA: Insufficient documentation

## 2020-03-15 DIAGNOSIS — I10 Essential (primary) hypertension: Secondary | ICD-10-CM | POA: Diagnosis not present

## 2020-03-15 DIAGNOSIS — M5489 Other dorsalgia: Secondary | ICD-10-CM | POA: Diagnosis not present

## 2020-03-15 DIAGNOSIS — R1013 Epigastric pain: Secondary | ICD-10-CM

## 2020-03-15 DIAGNOSIS — Z79899 Other long term (current) drug therapy: Secondary | ICD-10-CM | POA: Diagnosis not present

## 2020-03-15 DIAGNOSIS — Z7984 Long term (current) use of oral hypoglycemic drugs: Secondary | ICD-10-CM | POA: Insufficient documentation

## 2020-03-15 DIAGNOSIS — R0689 Other abnormalities of breathing: Secondary | ICD-10-CM | POA: Diagnosis not present

## 2020-03-15 DIAGNOSIS — M545 Low back pain: Secondary | ICD-10-CM | POA: Diagnosis not present

## 2020-03-15 DIAGNOSIS — E039 Hypothyroidism, unspecified: Secondary | ICD-10-CM | POA: Diagnosis not present

## 2020-03-15 LAB — COMPREHENSIVE METABOLIC PANEL
ALT: 22 U/L (ref 0–44)
AST: 23 U/L (ref 15–41)
Albumin: 4 g/dL (ref 3.5–5.0)
Alkaline Phosphatase: 59 U/L (ref 38–126)
Anion gap: 8 (ref 5–15)
BUN: 27 mg/dL — ABNORMAL HIGH (ref 8–23)
CO2: 27 mmol/L (ref 22–32)
Calcium: 9.4 mg/dL (ref 8.9–10.3)
Chloride: 105 mmol/L (ref 98–111)
Creatinine, Ser: 0.85 mg/dL (ref 0.61–1.24)
GFR calc Af Amer: >60 mL/min (ref >60)
GFR calc non Af Amer: >60 mL/min (ref >60)
Glucose, Bld: 104 mg/dL — ABNORMAL HIGH (ref 70–99)
Potassium: 3.9 mmol/L (ref 3.5–5.1)
Sodium: 140 mmol/L (ref 135–145)
Total Bilirubin: 0.6 mg/dL (ref 0.3–1.2)
Total Protein: 7.2 g/dL (ref 6.5–8.1)

## 2020-03-15 LAB — CBC
HCT: 38.7 % — ABNORMAL LOW (ref 39.0–52.0)
Hemoglobin: 12.9 g/dL — ABNORMAL LOW (ref 13.0–17.0)
MCH: 34.4 pg — ABNORMAL HIGH (ref 26.0–34.0)
MCHC: 33.3 g/dL (ref 30.0–36.0)
MCV: 103.2 fL — ABNORMAL HIGH (ref 80.0–100.0)
Platelets: 179 10*3/uL (ref 150–400)
RBC: 3.75 MIL/uL — ABNORMAL LOW (ref 4.22–5.81)
RDW: 12.4 % (ref 11.5–15.5)
WBC: 9.4 10*3/uL (ref 4.0–10.5)
nRBC: 0 % (ref 0.0–0.2)

## 2020-03-15 LAB — TROPONIN I (HIGH SENSITIVITY): Troponin I (High Sensitivity): 4 ng/L (ref <18)

## 2020-03-15 LAB — LIPASE, BLOOD: Lipase: 37 U/L (ref 11–51)

## 2020-03-15 MED ORDER — ONDANSETRON HCL 4 MG/2ML IJ SOLN
4.0000 mg | Freq: Once | INTRAMUSCULAR | Status: AC
  Administered 2020-03-15: 4 mg via INTRAVENOUS
  Filled 2020-03-15: qty 2

## 2020-03-15 MED ORDER — ALUM & MAG HYDROXIDE-SIMETH 200-200-20 MG/5ML PO SUSP
30.0000 mL | Freq: Once | ORAL | Status: AC
  Administered 2020-03-15: 30 mL via ORAL
  Filled 2020-03-15: qty 30

## 2020-03-15 MED ORDER — SODIUM CHLORIDE 0.9% FLUSH
3.0000 mL | Freq: Once | INTRAVENOUS | Status: AC
  Administered 2020-03-15: 3 mL via INTRAVENOUS

## 2020-03-15 MED ORDER — MORPHINE SULFATE (PF) 4 MG/ML IV SOLN
4.0000 mg | Freq: Once | INTRAVENOUS | Status: AC
  Administered 2020-03-15: 4 mg via INTRAVENOUS
  Filled 2020-03-15: qty 1

## 2020-03-15 MED ORDER — ONDANSETRON HCL 4 MG/2ML IJ SOLN: 4.0000 mg | Freq: Once | INTRAMUSCULAR | Status: DC

## 2020-03-15 MED ORDER — ONDANSETRON 4 MG PO TBDP
ORAL_TABLET | ORAL | 0 refills | Status: DC
Start: 2020-03-15 — End: 2021-06-18

## 2020-03-15 MED ORDER — IOHEXOL 300 MG/ML  SOLN
100.0000 mL | Freq: Once | INTRAMUSCULAR | Status: AC | PRN
  Administered 2020-03-15: 100 mL via INTRAVENOUS

## 2020-03-15 MED ORDER — SODIUM CHLORIDE (PF) 0.9 % IJ SOLN
INTRAMUSCULAR | Status: AC
  Filled 2020-03-15: qty 50

## 2020-03-15 NOTE — ED Triage Notes (Signed)
Pt presents via [facility] for report of epigastric abdominal pain with nausea. EMS administered 4mg  Zofran IVP and pt reports improvement with nausea.

## 2020-03-15 NOTE — ED Provider Notes (Signed)
[facility] DEPT Provider Note   CSN: [id number] Arrival date & time: 03/15/20  2114     History Chief Complaint  Patient presents with  . Abdominal Pain    Trevor Grimes is a 69 y.o. male.  69 yo M with a chief complaints of epigastric abdominal pain.  Started this evening when he ate a meal.  Felt like it started in his back and then went into his abdomen.  Caused him to vomit forcefully.  Feels that the pain is significantly better since then.  Still feels it to the epigastrium.  Denies fevers denies diarrhea.  Denies suspicious food intake.  Denies prior pain like this.  Denies chest pain denies shortness of breath.   The history is provided by the patient.  Abdominal Pain Pain location:  Epigastric Pain quality: sharp and shooting   Pain radiates to:  Does not radiate Pain severity:  Moderate Onset quality:  Gradual Duration:  2 hours Timing:  Constant Progression:  Partially resolved Chronicity:  New Relieved by:  Nothing Worsened by:  Nothing Ineffective treatments:  None tried Associated symptoms: nausea   Associated symptoms: no chest pain, no chills, no diarrhea, no fever, no shortness of breath and no vomiting        Past Medical History:  Diagnosis Date  . Anxiety   . Arthritis   . Chronic back pain   . Depression   . Diabetes mellitus without complication ([place])   . Hypothyroidism   . Kidney stones    hx of  . Sleep apnea    Has CPAP machine, does not wear every night  . Thyroid disease     Patient Active Problem List   Diagnosis Date Noted  . Shortness of breath 03/13/2020    Past Surgical History:  Procedure Laterality Date  . AMPUTATION Right 01/25/2013   Procedure: RIGHT 1ST RAY AMPUTATION ;  Surgeon: Wylene Simmer, MD;  Location: [facility];  Service: Orthopedics;  Laterality: Right;  . BACK SURGERY    . CERVICAL FUSION    . CHEILECTOMY Left 05/06/2016   Procedure: LEFT HALLUX METATARSAL PHALANGEAL JOINT  CHEILECTOMY AND CARTIVA  RESURFACING;  Surgeon: Wylene Simmer, MD;  Location: [facility];  Service: Orthopedics;  Laterality: Left;  . I & D EXTREMITY Right 01/25/2013   Procedure: IRRIGATION AND DEBRIDEMENT Right Hallux;  Surgeon: Wylene Simmer, MD;  Location: [facility];  Service: Orthopedics;  Laterality: Right;  . KNEE SURGERY         History reviewed. No pertinent family history.  Social History   Tobacco Use  . Smoking status: Never Smoker  . Smokeless tobacco: Never Used  Substance Use Topics  . Alcohol use: Yes    Comment: "occas"  . Drug use: No    Home Medications Prior to Admission medications   Medication Sig Start Date End Date Taking? Authorizing Provider  atorvastatin (LIPITOR) 20 MG tablet Take 20 mg by mouth daily.    Yes [provider]  cholecalciferol (VITAMIN D3) 25 MCG (1000 UNIT) tablet Take 1,000 Units by mouth daily.   Yes [provider]  cyclobenzaprine (FLEXERIL) 10 MG tablet Take 10 mg by mouth 3 (three) times daily as needed for muscle spasms.    Yes [provider]  glipiZIDE (GLUCOTROL) 5 MG tablet Take 5 mg by mouth 2 (two) times daily before a meal.    Yes [provider]  HYDROcodone-acetaminophen (NORCO/VICODIN) 5-325 MG tablet Take 1 tablet by mouth  every 6 (six) hours as needed for moderate pain.   Yes [provider]  levothyroxine (SYNTHROID, LEVOTHROID) 25 MCG tablet Take 25 mcg by mouth daily.   Yes [provider]  meloxicam (MOBIC) 15 MG tablet Take 15 mg by mouth daily.    Yes [provider]  metFORMIN (GLUCOPHAGE) 1000 MG tablet Take 1,000 mg by mouth in the morning, at noon, in the evening, and at bedtime.    Yes [provider]  OPTIVE 0.5-0.9 % ophthalmic solution Place 1 drop into both eyes 2 (two) times daily.  01/31/20  Yes [provider]  sertraline (ZOLOFT) 50 MG tablet Take 50 mg by mouth daily.   Yes [provider]  ACCU-CHEK  SMARTVIEW test strip  12/27/12   [provider]  docusate sodium (COLACE) 100 MG capsule Take 1 capsule (100 mg total) by mouth 2 (two) times daily. While taking narcotic pain medicine. Patient not taking: Reported on 03/13/2020 05/06/16   Corky Sing, PA-C  meclizine (ANTIVERT) 25 MG tablet Take 1 tablet (25 mg total) by mouth 3 (three) times daily as needed for dizziness. Patient not taking: Reported on 03/15/2020 10/19/16   Horton, Barbette Hair, MD  senna (SENOKOT) 8.6 MG TABS tablet Take 2 tablets (17.2 mg total) by mouth 2 (two) times daily. Patient not taking: Reported on 03/15/2020 05/06/16   Corky Sing, PA-C    Allergies    Patient has no known allergies.  Review of Systems   Review of Systems  Constitutional: Negative for chills and fever.  HENT: Negative for congestion and facial swelling.   Eyes: Negative for discharge and visual disturbance.  Respiratory: Negative for shortness of breath.   Cardiovascular: Negative for chest pain and palpitations.  Gastrointestinal: Positive for abdominal pain and nausea. Negative for diarrhea and vomiting.  Musculoskeletal: Negative for arthralgias and myalgias.  Skin: Negative for color change and rash.  Neurological: Negative for tremors, syncope and headaches.  Psychiatric/Behavioral: Negative for confusion and dysphoric mood.    Physical Exam Updated Vital Signs BP (!) 161/87 (BP Location: Right Arm)   Pulse 68   Temp 98.7 F (37.1 C) (Oral)   Resp 14   Ht 5\' 10"  (1.778 m)   Wt 93 kg   SpO2 97%   BMI 29.41 kg/m   Physical Exam Vitals and nursing note reviewed.  Constitutional:      Appearance: He is well-developed.  HENT:     Head: Normocephalic and atraumatic.  Eyes:     Pupils: Pupils are equal, round, and reactive to light.  Neck:     Vascular: No JVD.  Cardiovascular:     Rate and Rhythm: Normal rate and regular rhythm.     Heart sounds: No murmur. No friction rub. No gallop.   Pulmonary:      Effort: No respiratory distress.     Breath sounds: No wheezing.  Abdominal:     General: There is no distension.     Tenderness: There is no abdominal tenderness. There is no guarding or rebound.     Comments: Benign abdominal exam  Musculoskeletal:        General: Normal range of motion.     Cervical back: Normal range of motion and neck supple.  Skin:    Coloration: Skin is not pale.     Findings: No rash.  Neurological:     Mental Status: He is alert and oriented to person, place, and time.  Psychiatric:  Behavior: Behavior normal.     ED Results / Procedures / Treatments   Labs (all labs ordered are listed, but only abnormal results are displayed) Labs Reviewed  CBC - Abnormal; Notable for the following components:      Result Value   RBC 3.75 (*)    Hemoglobin 12.9 (*)    HCT 38.7 (*)    MCV 103.2 (*)    MCH 34.4 (*)    All other components within normal limits  LIPASE, BLOOD  COMPREHENSIVE METABOLIC PANEL  URINALYSIS, ROUTINE W REFLEX MICROSCOPIC  TROPONIN I (HIGH SENSITIVITY)    EKG EKG Interpretation  Date/Time:  Saturday March 15 2020 21:31:40 EDT Ventricular Rate:  70 PR Interval:    QRS Duration: 94 QT Interval:  391 QTC Calculation: 422 R Axis:   56 Text Interpretation: Sinus rhythm No significant change since last tracing Confirmed by Deno Etienne [phone]) on 03/15/2020 9:33:58 PM   Radiology No results found.  Procedures Procedures (including critical care time)  Medications Ordered in ED Medications  ondansetron (ZOFRAN) injection 4 mg (0 mg Intravenous Hold 03/15/20 2149)  sodium chloride flush (NS) 0.9 % injection 3 mL (3 mLs Intravenous Given 03/15/20 2146)  morphine 4 MG/ML injection 4 mg (4 mg Intravenous Given 03/15/20 2157)    ED Course  I have reviewed the triage vital signs and the nursing notes.  Pertinent labs & imaging results that were available during my care of the patient were reviewed by me and considered in my medical  decision making (see chart for details).    MDM Rules/Calculators/A&P                      69 yo M with a chief complaints of epigastric abdominal pain.  Benign exam for me.  Will obtain laboratory evaluation and CT scan reassess.  Lab work without LFT elevation and normal lipase.  Repeat benign abdominal exam.  Patient's feels like his pain is gotten mildly worse after having some improvement with morphine.  We will give a second dose.  Discharge home.  PCP follow-up.  11:37 PM:  I have discussed the diagnosis/risks/treatment options with the patient and family and believe the pt to be eligible for discharge home to follow-up with PCP. We also discussed returning to the ED immediately if new or worsening sx occur. We discussed the sx which are most concerning (e.g., sudden worsening pain, fever, inability to tolerate by mouth) that necessitate immediate return. Medications administered to the patient during their visit and any new prescriptions provided to the patient are listed below.  Medications given during this visit Medications  ondansetron (ZOFRAN) injection 4 mg (0 mg Intravenous Hold 03/15/20 2149)  sodium chloride (PF) 0.9 % injection (has no administration in time range)  alum & mag hydroxide-simeth (MAALOX/MYLANTA) 200-200-20 MG/5ML suspension 30 mL (has no administration in time range)  morphine 4 MG/ML injection 4 mg (has no administration in time range)  ondansetron (ZOFRAN) injection 4 mg (has no administration in time range)  sodium chloride flush (NS) 0.9 % injection 3 mL (3 mLs Intravenous Given 03/15/20 2146)  morphine 4 MG/ML injection 4 mg (4 mg Intravenous Given 03/15/20 2157)  iohexol (OMNIPAQUE) 300 MG/ML solution 100 mL (100 mLs Intravenous Contrast Given 03/15/20 2256)     The patient appears reasonably screen and/or stabilized for discharge and I doubt any other medical condition or other [facility] requiring further screening, evaluation, or treatment in the ED at this  time prior  to discharge.   Final Clinical Impression(s) / ED Diagnoses Final diagnoses:  None    Rx / DC Orders ED Discharge Orders    None       Deno Etienne, DO 03/16/20 1459

## 2020-03-15 NOTE — Discharge Instructions (Signed)
Try zantac or pepcid twice a day.  Try to avoid things that may make this worse, most commonly these are spicy foods tomato based products fatty foods chocolate and peppermint.  Alcohol and tobacco can also make this worse.  Return to the emergency department for sudden worsening pain fever or inability to eat or drink. ° °

## 2020-03-16 LAB — URINALYSIS, ROUTINE W REFLEX MICROSCOPIC
Bilirubin Urine: NEGATIVE
Glucose, UA: 50 mg/dL — AB
Hgb urine dipstick: NEGATIVE
Ketones, ur: 5 mg/dL — AB
Leukocytes,Ua: NEGATIVE
Nitrite: NEGATIVE
Protein, ur: NEGATIVE mg/dL
Specific Gravity, Urine: 1.026 (ref 1.005–1.030)
pH: 5 (ref 5.0–8.0)

## 2020-03-18 ENCOUNTER — Encounter: Payer: Medicare Other | Admitting: Physical Therapy

## 2020-03-18 ENCOUNTER — Encounter (HOSPITAL_COMMUNITY): Payer: Self-pay | Admitting: *Deleted

## 2020-03-18 DIAGNOSIS — Z8709 Personal history of other diseases of the respiratory system: Secondary | ICD-10-CM | POA: Diagnosis not present

## 2020-03-18 DIAGNOSIS — E119 Type 2 diabetes mellitus without complications: Secondary | ICD-10-CM | POA: Diagnosis not present

## 2020-03-18 DIAGNOSIS — E785 Hyperlipidemia, unspecified: Secondary | ICD-10-CM | POA: Diagnosis not present

## 2020-03-18 DIAGNOSIS — Z6830 Body mass index (BMI) 30.0-30.9, adult: Secondary | ICD-10-CM | POA: Diagnosis not present

## 2020-03-18 DIAGNOSIS — Z7189 Other specified counseling: Secondary | ICD-10-CM | POA: Diagnosis not present

## 2020-03-20 ENCOUNTER — Encounter: Payer: Medicare Other | Admitting: Physical Therapy

## 2020-03-21 ENCOUNTER — Other Ambulatory Visit: Payer: Self-pay

## 2020-03-21 ENCOUNTER — Ambulatory Visit (HOSPITAL_COMMUNITY): Payer: Medicare Other | Attending: Cardiology

## 2020-03-21 DIAGNOSIS — E0800 Diabetes mellitus due to underlying condition with hyperosmolarity without nonketotic hyperglycemic-hyperosmolar coma (NKHHC): Secondary | ICD-10-CM | POA: Diagnosis not present

## 2020-03-21 DIAGNOSIS — Z01810 Encounter for preprocedural cardiovascular examination: Secondary | ICD-10-CM | POA: Insufficient documentation

## 2020-03-21 LAB — MYOCARDIAL PERFUSION IMAGING
LV dias vol: 87 mL (ref 62–150)
LV sys vol: 40 mL
Peak HR: 91 {beats}/min
Rest HR: 68 {beats}/min
SDS: 1
SRS: 0
SSS: 1
TID: 0.98

## 2020-03-21 MED ORDER — REGADENOSON 0.4 MG/5ML IV SOLN
0.4000 mg | Freq: Once | INTRAVENOUS | Status: AC
  Administered 2020-03-21: 0.4 mg via INTRAVENOUS

## 2020-03-21 MED ORDER — TECHNETIUM TC 99M TETROFOSMIN IV KIT
11.0000 | PACK | Freq: Once | INTRAVENOUS | Status: AC | PRN
  Administered 2020-03-21: 11 via INTRAVENOUS
  Filled 2020-03-21: qty 11

## 2020-03-21 MED ORDER — TECHNETIUM TC 99M TETROFOSMIN IV KIT
31.7000 | PACK | Freq: Once | INTRAVENOUS | Status: AC | PRN
  Administered 2020-03-21: 31.7 via INTRAVENOUS
  Filled 2020-03-21: qty 32

## 2020-03-24 ENCOUNTER — Telehealth: Payer: Self-pay

## 2020-03-24 NOTE — Telephone Encounter (Signed)
Left message on patients voicemail to please return our call.   

## 2020-03-24 NOTE — Telephone Encounter (Signed)
-----   Message from Berniece Salines, DO sent at 03/24/2020 12:47 PM EDT ----- Stress test normal.  Please forward copy to me.

## 2020-03-25 ENCOUNTER — Other Ambulatory Visit: Payer: Self-pay

## 2020-03-25 ENCOUNTER — Ambulatory Visit: Payer: Medicare Other | Attending: Neurosurgery | Admitting: Physical Therapy

## 2020-03-25 ENCOUNTER — Encounter: Payer: Self-pay | Admitting: Physical Therapy

## 2020-03-25 DIAGNOSIS — M25561 Pain in right knee: Secondary | ICD-10-CM | POA: Insufficient documentation

## 2020-03-25 DIAGNOSIS — M545 Low back pain: Secondary | ICD-10-CM | POA: Diagnosis not present

## 2020-03-25 DIAGNOSIS — M25562 Pain in left knee: Secondary | ICD-10-CM | POA: Insufficient documentation

## 2020-03-25 DIAGNOSIS — G8929 Other chronic pain: Secondary | ICD-10-CM | POA: Insufficient documentation

## 2020-03-25 NOTE — Telephone Encounter (Signed)
Spoke with patient regarding results.  Patient verbalizes understanding and is agreeable to plan of care. Advised patient to call back with any issues or concerns.  

## 2020-03-25 NOTE — Therapy (Signed)
[facility] [address], [place], [place], [address] Phone: [phone]   Fax:  [phone]  Physical Therapy Treatment  Patient Details  Name: Trevor Grimes MRN: [id number] Date of Birth: [birth year] Referring Provider (PT): Sherlie Ban. Sterns    Encounter Date: 03/25/2020  PT End of Session - 03/25/20 1231    Visit Number  7    Date for PT Re-Evaluation  04/15/20    Authorization Type  Medicare    Progress Note Due on Visit  10    PT Start Time  1230    PT Stop Time  1319    PT Time Calculation (min)  49 min    Activity Tolerance  Patient tolerated treatment well;Patient limited by pain    Behavior During Therapy  [facility] for tasks assessed/performed       Past Medical History:  Diagnosis Date  . Anxiety   . Arthritis   . Chronic back pain   . Depression   . Diabetes mellitus without complication ([place])   . Hypothyroidism   . Kidney stones    hx of  . Sleep apnea    Has CPAP machine, does not wear every night  . Thyroid disease     Past Surgical History:  Procedure Laterality Date  . AMPUTATION Right 01/25/2013   Procedure: RIGHT 1ST RAY AMPUTATION ;  Surgeon: Wylene Simmer, MD;  Location: [facility];  Service: Orthopedics;  Laterality: Right;  . BACK SURGERY    . CERVICAL FUSION    . CHEILECTOMY Left 05/06/2016   Procedure: LEFT HALLUX METATARSAL PHALANGEAL JOINT CHEILECTOMY AND CARTIVA  RESURFACING;  Surgeon: Wylene Simmer, MD;  Location: [facility];  Service: Orthopedics;  Laterality: Left;  . I & D EXTREMITY Right 01/25/2013   Procedure: IRRIGATION AND DEBRIDEMENT Right Hallux;  Surgeon: Wylene Simmer, MD;  Location: [facility] [place];  Service: Orthopedics;  Laterality: Right;  . KNEE SURGERY      There were no vitals filed for this visit.  Subjective Assessment - 03/25/20 1231    Subjective  I think the DN did wonders. I can move better and feel stronger.    Pertinent History  Left L4/5 lumbar  laminectomy, cervical surgery, DM, asthma, anxiety, OA    Patient Stated Goals  Find relief    Currently in Pain?  Yes    Pain Score  2     Pain Location  Back    Pain Orientation  Lower    Pain Descriptors / Indicators  Aching    Pain Type  Chronic pain    Pain Score  2    Pain Location  Knee    Pain Orientation  Right;Left;Lateral    Pain Descriptors / Indicators  Aching    Pain Type  Chronic pain                       [facility] Adult PT Treatment/Exercise - 03/25/20 0001      Lumbar Exercises: Standing   Functional Squats  5 reps    Functional Squats Limitations  painful today in knees      Lumbar Exercises: Seated   Sit to Stand  20 reps   8#   Other Seated Lumbar Exercises  chops, rotations and flex/ext  with yellow ball x 10 ea way      Manual Therapy   Manual Therapy  Soft tissue mobilization;Myofascial release    Manual therapy comments  Skilled palpation  and monitoring of soft tissues during DN    Soft tissue mobilization  to bil gluteals and lateral quads    Myofascial Release  to bil ITB       Trigger Point Dry Needling - 03/25/20 0001    Consent Given?  Yes    Education Handout Provided  Previously provided    Muscles Treated Lower Quadrant  Quadriceps    Muscles Treated Back/Hip  Gluteus medius;Gluteus maximus    Dry Needling Comments  bil    Quadriceps Response  Twitch response elicited;Palpable increased muscle length    Gluteus Medius Response  Twitch response elicited;Palpable increased muscle length    Gluteus Maximus Response  Twitch response elicited;Palpable increased muscle length           PT Education - 03/25/20 1329    Education Details  HEP Progressed    Person(s) Educated  Patient    Methods  Explanation;Demonstration;Handout    Comprehension  Verbalized understanding;Returned demonstration       PT Short Term Goals - 03/04/20 1019      PT SHORT TERM GOAL #1   Title  Independent and compliant with initial HEP     Status  Achieved        PT Long Term Goals - 03/25/20 1246      PT LONG TERM GOAL #1   Title  Ind with HEP for flexibility and core strength    Status  Partially Met      PT LONG TERM GOAL #2   Title  Patient to report >= 50% decreased pain with ADLS    Baseline  50%    Status  Achieved      PT LONG TERM GOAL #3   Title  Patient to report decreased pain in the morning by 50% or more.    Baseline  75% better    Status  Achieved            Plan - 03/25/20 1325    Clinical Impression Statement  Patient has met his LTG's and will be ready for discharge next visit. He still has tightness in bil lateral quads left>right and in his left gluteals. His right gluteals are much looser with minimal TPs today. Patient able to tolerate seated core exercises much better than standing due to knees.    Comorbidities  lumbar laminectomy, cervical surgery, DM, asthma, anxiety, OA    PT Frequency  2x / week    PT Duration  6 weeks    PT Treatment/Interventions  ADLs/Self Care Home Management;Cryotherapy;Electrical Stimulation;Iontophoresis 4mg/ml Dexamethasone;Moist Heat;Traction;Therapeutic activities;Therapeutic exercise;Neuromuscular re-education;Manual techniques;Patient/family education;Dry needling;Spinal Manipulations;Taping;Joint Manipulations    PT Next Visit Plan  FOTO; Finalize HEP with ITB strap stretch; add gastroc stretch as well.    PT Home Exercise Plan  [id number]    Consulted and Agree with Plan of Care  Patient       Patient will benefit from skilled therapeutic intervention in order to improve the following deficits and impairments:  Decreased range of motion, Increased muscle spasms, Decreased activity tolerance, Pain, Impaired flexibility, Postural dysfunction, Decreased strength  Visit Diagnosis: Chronic midline low back pain without sciatica  Chronic pain of right knee  Chronic pain of left knee     Problem List Patient Active Problem List   Diagnosis Date  Noted  . Shortness of breath 03/13/2020    Julie Riddles PT 03/25/2020, 1:32 PM  [facility] [address], [address] [place], [place], [address] Phone:   [phone]   Fax:  [phone]  Name: Trevor Grimes MRN: [id number] Date of Birth: July 24, 1951

## 2020-03-25 NOTE — Telephone Encounter (Signed)
Left message on patients voicemail to please return our call.   

## 2020-03-25 NOTE — Patient Instructions (Signed)
Access Code: [facility] URL: [email or web address] Date: 03/04/2020 Prepared by: Madelyn Flavors  Exercises Supine Bridge - 2 x daily - 7 x weekly - 10 reps - 1-2 sets - 5 sec hold Supine Lower Trunk Rotation - 1 x daily - 7 x weekly - 5 reps - 1 sets - 10 sec hold Supine Single Knee to Chest Stretch - 1 x daily - 7 x weekly - 5 reps - 1 sets - 20 sec hold Static Prone on Elbows - 2-3 x daily - 7 x weekly - 3 reps - 1 sets - 30-60 sec hold Plank on Knees - 1 x daily - 7 x weekly - 5 reps - 1 sets - max hold hold Supine 90/90 Alternating Toe Touch - 1 x daily - 7 x weekly - 3 sets - 10 reps Marching Bridge - 1 x daily - 7 x weekly - 10 reps - 3 sets Prone Quadriceps Stretch with Strap - 2 x daily - 7 x weekly - 1 sets - 3 reps - 60 sec hold Supine Bilateral Hip Internal Rotation Stretch - 2 x daily - 7 x weekly - 1 sets - 3 reps - 30-60 sec hold Seated Thoracic Extension Arms Overhead - 1 x daily - 7 x weekly - 10 reps - 1-2 sets - 2-3 sec hold Seated Trunk Rotation - 1 x daily - 7 x weekly - 10 reps - 1-2 sets - 2-3 sec hold Seated Diagonal Chops with Medicine Ball - 1 x daily - 7 x weekly - 10 reps - 1-2 sets - 2-3 hold Supine ITB Stretch with Strap - 2 x daily - 7 x weekly - 3 reps - 1 sets - 60 sec hold  Patient Education Trigger Point Dry Needling TENS Unit

## 2020-03-27 ENCOUNTER — Telehealth: Payer: Self-pay | Admitting: Cardiology

## 2020-03-27 ENCOUNTER — Telehealth: Payer: Self-pay | Admitting: *Deleted

## 2020-03-27 NOTE — Telephone Encounter (Signed)
Follow up   Pt is returning call    

## 2020-03-27 NOTE — Telephone Encounter (Signed)
   [facility] Pre-operative Risk Assessment    Request for surgical clearance:  1. What type of surgery is being performed? Right Knee total replacement   2. When is this surgery scheduled? TBD   3. What type of clearance is required (medical clearance vs. Pharmacy clearance to hold med vs. Both)? Medical Risk Assessment  4. Are there any medications that need to be held prior to surgery and how long?NA   5. Practice name and name of physician performing surgery? [facility], Dr. Elsie Saas   6. What is your office phone number [phone] Sher[place] ext [phone]    7.   What is your office fax number [phone]  8.   Anesthesia type (None, local, MAC, general) ? Not noted   Roselie Skinner 03/27/2020, 5:03 PM  _________________________________________________________________   (provider comments below)

## 2020-03-27 NOTE — Telephone Encounter (Signed)
Patient was notified that his test results have been faxed to both of these Dr's per his request.

## 2020-03-27 NOTE — Telephone Encounter (Signed)
Patient would like his stress test results sent to both of his doctors. Dr. Drema Dallas and Dr. Noemi Chapel. Please advise when results are sent. He states he needs to f/u with both of them after results are received.

## 2020-03-27 NOTE — Telephone Encounter (Signed)
Left message on patients voicemail to please return our call.   

## 2020-03-28 NOTE — Telephone Encounter (Signed)
   Primary Cardiologist: Berniece Salines, DO  Chart reviewed as part of pre-operative protocol coverage. Patient was contacted 03/28/2020 in reference to pre-operative risk assessment for pending surgery as outlined below.  Trevor Grimes was last seen on 03/13/20 by Dr. Harriet Masson.  This was a new patient evaluation for preop clearance. Given risk factors, nuclear stress test was obtain and was negative for reversible ischemia. Per Dr. Terrial Rhodes note, he is cleared for surgery.   Therefore, based on ACC/AHA guidelines, the patient would be at acceptable risk for the planned procedure without further cardiovascular testing.   I will route this recommendation to the requesting party via Epic fax function and remove from pre-op pool.  Please call with questions.  [place], PA 03/28/2020, 10:30 AM

## 2020-04-01 ENCOUNTER — Ambulatory Visit: Payer: Medicare Other | Admitting: Physical Therapy

## 2020-04-01 ENCOUNTER — Encounter: Payer: Self-pay | Admitting: Physical Therapy

## 2020-04-01 ENCOUNTER — Other Ambulatory Visit: Payer: Self-pay

## 2020-04-01 DIAGNOSIS — M25562 Pain in left knee: Secondary | ICD-10-CM | POA: Diagnosis not present

## 2020-04-01 DIAGNOSIS — M545 Low back pain: Secondary | ICD-10-CM

## 2020-04-01 DIAGNOSIS — G8929 Other chronic pain: Secondary | ICD-10-CM | POA: Diagnosis not present

## 2020-04-01 DIAGNOSIS — M25561 Pain in right knee: Secondary | ICD-10-CM | POA: Diagnosis not present

## 2020-04-01 NOTE — Patient Instructions (Signed)
Access Code: [facility] URL: [email or web address] Date: 03/04/2020 Prepared by: Madelyn Flavors  Exercises Supine Bridge - 2 x daily - 7 x weekly - 10 reps - 1-2 sets - 5 sec hold Supine Lower Trunk Rotation - 1 x daily - 7 x weekly - 5 reps - 1 sets - 10 sec hold Supine Single Knee to Chest Stretch - 1 x daily - 7 x weekly - 5 reps - 1 sets - 20 sec hold Static Prone on Elbows - 2-3 x daily - 7 x weekly - 3 reps - 1 sets - 30-60 sec hold Plank on Knees - 1 x daily - 7 x weekly - 5 reps - 1 sets - max hold hold Supine 90/90 Alternating Toe Touch - 1 x daily - 7 x weekly - 3 sets - 10 reps Marching Bridge - 1 x daily - 7 x weekly - 10 reps - 3 sets Prone Quadriceps Stretch with Strap - 2 x daily - 7 x weekly - 1 sets - 3 reps - 60 sec hold Supine Bilateral Hip Internal Rotation Stretch - 2 x daily - 7 x weekly - 1 sets - 3 reps - 30-60 sec hold Seated Thoracic Extension Arms Overhead - 1 x daily - 7 x weekly - 10 reps - 1-2 sets - 2-3 sec hold Seated Trunk Rotation - 1 x daily - 7 x weekly - 10 reps - 1-2 sets - 2-3 sec hold Seated Diagonal Chops with Medicine Ball - 1 x daily - 7 x weekly - 10 reps - 1-2 sets - 2-3 hold Supine ITB Stretch with Strap - 2 x daily - 7 x weekly - 3 reps - 1 sets - 60 sec hold Supine Active Straight Leg Raise - 1 x daily - 7 x weekly - 3 sets - 10 reps Straight Leg Raise with External Rotation - 1 x daily - 7 x weekly - 10 reps - 3 sets  Patient Education Trigger Point Dry Needling TENS Unit

## 2020-04-01 NOTE — Therapy (Signed)
[facility] [address] [place], [place], [address] Phone: [phone]   Fax:  [phone]  Physical Therapy Treatment  Patient Details  Name: Trevor Grimes MRN: [id number] Date of Birth: 01-03-1951 Referring Provider (PT): Sherlie Ban. Sterns    Encounter Date: 04/01/2020  PT End of Session - 04/01/20 1403    Visit Number  8    Date for PT Re-Evaluation  05/27/20    Authorization Type  Medicare    PT Start Time  8115    PT Stop Time  1446    PT Time Calculation (min)  42 min       Past Medical History:  Diagnosis Date  . Anxiety   . Arthritis   . Chronic back pain   . Depression   . Diabetes mellitus without complication ([place])   . Hypothyroidism   . Kidney stones    hx of  . Sleep apnea    Has CPAP machine, does not wear every night  . Thyroid disease     Past Surgical History:  Procedure Laterality Date  . AMPUTATION Right 01/25/2013   Procedure: RIGHT 1ST RAY AMPUTATION ;  Surgeon: Wylene Simmer, MD;  Location: [place];  Service: Orthopedics;  Laterality: Right;  . BACK SURGERY    . CERVICAL FUSION    . CHEILECTOMY Left 05/06/2016   Procedure: LEFT HALLUX METATARSAL PHALANGEAL JOINT CHEILECTOMY AND CARTIVA  RESURFACING;  Surgeon: Wylene Simmer, MD;  Location: [place];  Service: Orthopedics;  Laterality: Left;  . I & D EXTREMITY Right 01/25/2013   Procedure: IRRIGATION AND DEBRIDEMENT Right Hallux;  Surgeon: Wylene Simmer, MD;  Location: [place];  Service: Orthopedics;  Laterality: Right;  . KNEE SURGERY      There were no vitals filed for this visit.  Subjective Assessment - 04/01/20 1407    Subjective  I hurt my back over the weekend putting in railroad ties. They were too heavy.    Pertinent History  Left L4/5 lumbar laminectomy, cervical surgery, DM, asthma, anxiety, OA    Patient Stated Goals  Find relief    Currently in Pain?  Yes    Pain Score  6     Pain Location  Back     Pain Orientation  Lower    Pain Descriptors / Indicators  Aching         [facility] PT Assessment - 04/01/20 0001      Observation/Other Assessments   Focus on Therapeutic Outcomes (FOTO)   42% limited (8th visit)                   [facility] Adult PT Treatment/Exercise - 04/01/20 0001      Lumbar Exercises: Stretches   Lower Trunk Rotation  4 reps;20 seconds    Other Lumbar Stretch Exercise  supine hip IR with overpressure from other foot x 60 sec ea      Lumbar Exercises: Aerobic   Stationary Bike  L2 x 5 min      Lumbar Exercises: Supine   Straight Leg Raise  10 reps    Straight Leg Raises Limitations  bil with ab set; then with hip ER x 10 bil      Manual Therapy   Manual Therapy  Soft tissue mobilization;Myofascial release    Manual therapy comments  Skilled palpation and monitoring of soft tissues during DN    Soft tissue mobilization  to bil gluteals and QL    Myofascial  Release  to right QL prior to DN       Trigger Point Dry Needling - 04/01/20 0001    Consent Given?  Yes    Education Handout Provided  Previously provided    Muscles Treated Back/Hip  Quadratus lumborum;Lumbar multifidi;Gluteus medius;Gluteus minimus    Dry Needling Comments  right lumbar multifidi and gluteals; else bil    Gluteus Medius Response  Twitch response elicited;Palpable increased muscle length    Gluteus Maximus Response  Twitch response elicited;Palpable increased muscle length    Lumbar multifidi Response  Palpable increased muscle length    Quadratus Lumborum Response  Twitch response elicited;Palpable increased muscle length             PT Short Term Goals - 03/04/20 1019      PT SHORT TERM GOAL #1   Title  Independent and compliant with initial HEP    Status  Achieved        PT Long Term Goals - 03/25/20 1246      PT LONG TERM GOAL #1   Title  Ind with HEP for flexibility and core strength    Status  Partially Met      PT LONG TERM GOAL #2   Title   Patient to report >= 50% decreased pain with ADLS    Baseline  91%    Status  Achieved      PT LONG TERM GOAL #3   Title  Patient to report decreased pain in the morning by 50% or more.    Baseline  75% better    Status  Achieved            Plan - 04/01/20 1601    Clinical Impression Statement  Plan had been to D/C patient, however he re-injured his back over the weekend lifting railroad ties.FOTO impoved to 42% limitations vs 44% at intake despite flare up.  He presents today with increased tightness and pain in the lumbar spine. Patient was able to tolerate stretches for low back and abdominal and quad strengthening without increased pain. SLR was issued to prepare for TKR in June. Patient very tight in bil QL and lumbar spine. He responded well to DN and STW with decreased tissue tension at end of session. Plan is to cont PT for a few more visits prior to TKR. He will be gone for a couple of weeks out of town as well.    Comorbidities  lumbar laminectomy, cervical surgery, DM, asthma, anxiety, OA    PT Frequency  1x / week    PT Duration  6 weeks    PT Treatment/Interventions  ADLs/Self Care Home Management;Cryotherapy;Electrical Stimulation;Iontophoresis '4mg'$ /ml Dexamethasone;Moist Heat;Traction;Therapeutic activities;Therapeutic exercise;Neuromuscular re-education;Manual techniques;Patient/family education;Dry needling;Spinal Manipulations;Taping;Joint Manipulations    PT Next Visit Plan  cont 1x/wk for 4 wks after returns from out of town and prior to TKR. Reassess/Progress note; DN/STW prn to Lumbar/hips/quads; add ITB stretch and gastroc stretch to HEP.    PT Home Exercise Plan  [id number]    Consulted and Agree with Plan of Care  Patient       Patient will benefit from skilled therapeutic intervention in order to improve the following deficits and impairments:  Decreased range of motion, Increased muscle spasms, Decreased activity tolerance, Pain, Impaired flexibility, Postural  dysfunction, Decreased strength  Visit Diagnosis: Chronic midline low back pain without sciatica - Plan: PT plan of care cert/re-cert  Chronic pain of right knee - Plan: PT plan of care cert/re-cert  Chronic pain  of left knee - Plan: PT plan of care cert/re-cert     Problem List Patient Active Problem List   Diagnosis Date Noted  . Shortness of breath 03/13/2020    Madelyn Flavors PT 04/01/2020, 4:14 PM  [facility] [address], [place], [place], [address] Phone: [phone]   Fax:  [phone]  Name: Trevor Grimes MRN: [id number] Date of Birth: 06-29-1951

## 2020-04-04 DIAGNOSIS — M5136 Other intervertebral disc degeneration, lumbar region: Secondary | ICD-10-CM | POA: Diagnosis not present

## 2020-04-08 ENCOUNTER — Encounter: Payer: Medicare Other | Admitting: Physical Therapy

## 2020-04-09 DIAGNOSIS — E538 Deficiency of other specified B group vitamins: Secondary | ICD-10-CM | POA: Diagnosis not present

## 2020-04-15 ENCOUNTER — Ambulatory Visit: Payer: Medicare Other | Admitting: Physical Therapy

## 2020-04-16 DIAGNOSIS — M48062 Spinal stenosis, lumbar region with neurogenic claudication: Secondary | ICD-10-CM | POA: Diagnosis not present

## 2020-04-29 DIAGNOSIS — M5136 Other intervertebral disc degeneration, lumbar region: Secondary | ICD-10-CM | POA: Diagnosis not present

## 2020-05-06 ENCOUNTER — Ambulatory Visit: Payer: Medicare Other | Attending: Neurosurgery | Admitting: Physical Therapy

## 2020-05-06 ENCOUNTER — Other Ambulatory Visit: Payer: Self-pay

## 2020-05-06 ENCOUNTER — Encounter: Payer: Self-pay | Admitting: Physical Therapy

## 2020-05-06 DIAGNOSIS — G8929 Other chronic pain: Secondary | ICD-10-CM | POA: Diagnosis present

## 2020-05-06 DIAGNOSIS — M25561 Pain in right knee: Secondary | ICD-10-CM | POA: Insufficient documentation

## 2020-05-06 DIAGNOSIS — M25562 Pain in left knee: Secondary | ICD-10-CM | POA: Diagnosis present

## 2020-05-06 DIAGNOSIS — M545 Low back pain, unspecified: Secondary | ICD-10-CM

## 2020-05-06 NOTE — Patient Instructions (Signed)
Access Code: [facility] URL: [email or web address] Date: 05/06/2020 Prepared by: Almyra Free  Exercises Supine Bridge - 2 x daily - 7 x weekly - 10 reps - 1-2 sets - 5 sec hold Supine Lower Trunk Rotation - 1 x daily - 7 x weekly - 5 reps - 1 sets - 10 sec hold Supine Single Knee to Chest Stretch - 1 x daily - 7 x weekly - 5 reps - 1 sets - 20 sec hold Static Prone on Elbows - 2-3 x daily - 7 x weekly - 3 reps - 1 sets - 30-60 sec hold Plank on Knees - 1 x daily - 7 x weekly - 5 reps - 1 sets - max hold hold Supine 90/90 Alternating Toe Touch - 1 x daily - 7 x weekly - 3 sets - 10 reps Marching Bridge - 1 x daily - 7 x weekly - 10 reps - 3 sets Prone Quadriceps Stretch with Strap - 2 x daily - 7 x weekly - 1 sets - 3 reps - 60 sec hold Supine Bilateral Hip Internal Rotation Stretch - 2 x daily - 7 x weekly - 1 sets - 3 reps - 30-60 sec hold Seated Thoracic Extension Arms Overhead - 1 x daily - 7 x weekly - 10 reps - 1-2 sets - 2-3 sec hold Seated Trunk Rotation - 1 x daily - 7 x weekly - 10 reps - 1-2 sets - 2-3 sec hold Seated Diagonal Chops with Medicine Ball - 1 x daily - 7 x weekly - 10 reps - 1-2 sets - 2-3 hold Supine ITB Stretch with Strap - 2 x daily - 7 x weekly - 3 reps - 1 sets - 60 sec hold Supine Active Straight Leg Raise - 1 x daily - 7 x weekly - 3 sets - 10 reps Straight Leg Raise with External Rotation - 1 x daily - 7 x weekly - 10 reps - 3 sets Seated Hip Internal Rotation with Ball and Resistance - 1 x daily - 7 x weekly - 2-3 sets - 10 reps  Patient Education Trigger Point Dry Needling TENS Unit

## 2020-05-06 NOTE — Therapy (Addendum)
[facility] [address] [place], [place], [address] Phone: [phone]   Fax:  [phone]  Physical Therapy Treatment  Patient Details  Name: Trevor Grimes MRN: [id number] Date of Birth: March 20, 1951 Referring Provider (PT): Sherlie Ban. Sterns    Encounter Date: 05/06/2020   PT End of Session - 05/06/20 1019    Visit Number 9    Date for PT Re-Evaluation 05/27/20    Authorization Type Medicare    PT Start Time 3710    PT Stop Time 1101    PT Time Calculation (min) 46 min    Activity Tolerance Patient tolerated treatment well;Patient limited by pain    Behavior During Therapy [facility] for tasks assessed/performed           Past Medical History:  Diagnosis Date  . Anxiety   . Arthritis   . Chronic back pain   . Depression   . Diabetes mellitus without complication ( [place])   . Hypothyroidism   . Kidney stones    hx of  . Sleep apnea    Has CPAP machine, does not wear every night  . Thyroid disease     Past Surgical History:  Procedure Laterality Date  . AMPUTATION Right 01/25/2013   Procedure: RIGHT 1ST RAY AMPUTATION ;  Surgeon: Wylene Simmer, MD;  Location: [place];  Service: Orthopedics;  Laterality: Right;  . BACK SURGERY    . CERVICAL FUSION    . CHEILECTOMY Left 05/06/2016   Procedure: LEFT HALLUX METATARSAL PHALANGEAL JOINT CHEILECTOMY AND CARTIVA  RESURFACING;  Surgeon: Wylene Simmer, MD;  Location: [place];  Service: Orthopedics;  Laterality: Left;  . I & D EXTREMITY Right 01/25/2013   Procedure: IRRIGATION AND DEBRIDEMENT Right Hallux;  Surgeon: Wylene Simmer, MD;  Location: [place];  Service: Orthopedics;  Laterality: Right;  . KNEE SURGERY      There were no vitals filed for this visit.   Subjective Assessment - 05/06/20 1020    Subjective Knee surgery scheduled for 05/14/20. Back pain 4/10.    Pertinent History Left L4/5 lumbar laminectomy, cervical surgery, DM, asthma, anxiety,  OA    Patient Stated Goals Find relief    Currently in Pain? Yes    Pain Score 4     Pain Location Back    Pain Orientation Lower    Pain Descriptors / Indicators Aching    Pain Score 4    Pain Location Knee    Pain Orientation Right    Pain Descriptors / Indicators Aching    Pain Type Chronic pain                             [facility] Adult PT Treatment/Exercise - 05/06/20 0001      Lumbar Exercises: Aerobic   Stationary Bike L2 x 5 min      Lumbar Exercises: Seated   Other Seated Lumbar Exercises hip IR bil x 10      Lumbar Exercises: Supine   Straight Leg Raise 5 reps    Straight Leg Raises Limitations with ER x 5 reps with cues for solid quad set      Manual Therapy   Manual Therapy Soft tissue mobilization    Manual therapy comments Skilled palpation and monitoring of soft tissues during DN    Soft tissue mobilization to bil gluteals and quadriceps            Trigger Point  Dry Needling - 05/06/20 0001    Consent Given? Yes    Education Handout Provided Previously provided    Muscles Treated Lower Quadrant Quadriceps    Muscles Treated Back/Hip Gluteus minimus;Gluteus medius;Gluteus maximus    Dry Needling Comments bil    Quadriceps Response Twitch response elicited;Palpable increased muscle length   left > right   Gluteus Minimus Response Twitch response elicited;Palpable increased muscle length    Gluteus Medius Response Twitch response elicited;Palpable increased muscle length    Gluteus Maximus Response Twitch response elicited;Palpable increased muscle length                PT Education - 05/06/20 1413    Education Details HEP finalized    Person(s) Educated Patient    Methods Explanation;Demonstration;Handout    Comprehension Verbalized understanding;Returned demonstration            PT Short Term Goals - 03/04/20 1019      PT SHORT TERM GOAL #1   Title Independent and compliant with initial HEP    Status Achieved               PT Long Term Goals - 05/06/20 1028      PT LONG TERM GOAL #1   Title Ind with HEP for flexibility and core strength    Status Achieved      PT LONG TERM GOAL #2   Title Patient to report >= 50% decreased pain with ADLS    Status Achieved      PT LONG TERM GOAL #3   Title Patient to report decreased pain in the morning by 50% or more.    Status Achieved                 Plan - 05/06/20 1414    Clinical Impression Statement Patient returns today after 4 week absence. His back pain has improved since last visit when it had flared up. He has met all LTGs. He did have TPs in bil gluteals and quadriceps today, left > right and responded well to manual therapy today. He has one more visit scheduled but may cancel as he is pleased with his progress and has a Right TKR surgery scheduled for 05/14/20.    Personal Factors and Comorbidities Comorbidity 3+    Comorbidities lumbar laminectomy, cervical surgery, DM, asthma, anxiety, OA    Examination-Activity Limitations Stand;Locomotion Level    Examination-Participation Restrictions Yard Work    PT Frequency 1x / week    PT Duration 6 weeks    PT Treatment/Interventions ADLs/Self Care Home Management;Cryotherapy;Electrical Stimulation;Iontophoresis 25m/ml Dexamethasone;Moist Heat;Traction;Therapeutic activities;Therapeutic exercise;Neuromuscular re-education;Manual techniques;Patient/family education;Dry needling;Spinal Manipulations;Taping;Joint Manipulations    PT Next Visit Plan d/c to HEP    [facility]   Consulted and Agree with Plan of Care Patient           Patient will benefit from skilled therapeutic intervention in order to improve the following deficits and impairments:  Decreased range of motion, Increased muscle spasms, Decreased activity tolerance, Pain, Impaired flexibility, Postural dysfunction, Decreased strength  Visit Diagnosis: Chronic midline low back pain without sciatica  Chronic pain  of right knee  Chronic pain of left knee     Problem List Patient Active Problem List   Diagnosis Date Noted  . Shortness of breath 03/13/2020    JMadelyn FlavorsPT 05/06/2020, 2:18 PM  [facility] [address] [place] [address]: [phone]  Fax:  [phone]  Name: Trevor Grimes MRN: [id number] Date of Birth: 1951-09-13  PHYSICAL THERAPY DISCHARGE SUMMARY  Visits from Start of Care: 9  Current functional level related to goals / functional outcomes: See above   Remaining deficits: See above   Education / Equipment: HEP Plan: Patient agrees to discharge.  Patient goals were met. Patient is being discharged due to meeting the stated rehab goals.  ?????Patient phoned after today's visit and asked to cancel last visit and d/c.    Madelyn Flavors, PT 05/06/20 5:28 PM [facility] [address], [place], [place] [address] Phone # [phone] Fax [phone]

## 2020-05-07 DIAGNOSIS — D692 Other nonthrombocytopenic purpura: Secondary | ICD-10-CM | POA: Diagnosis not present

## 2020-05-07 DIAGNOSIS — L821 Other seborrheic keratosis: Secondary | ICD-10-CM | POA: Diagnosis not present

## 2020-05-07 DIAGNOSIS — L810 Postinflammatory hyperpigmentation: Secondary | ICD-10-CM | POA: Diagnosis not present

## 2020-05-07 DIAGNOSIS — L570 Actinic keratosis: Secondary | ICD-10-CM | POA: Diagnosis not present

## 2020-05-08 ENCOUNTER — Other Ambulatory Visit: Payer: Self-pay | Admitting: Neurosurgery

## 2020-05-08 DIAGNOSIS — M5412 Radiculopathy, cervical region: Secondary | ICD-10-CM | POA: Diagnosis not present

## 2020-05-08 DIAGNOSIS — M47816 Spondylosis without myelopathy or radiculopathy, lumbar region: Secondary | ICD-10-CM | POA: Diagnosis not present

## 2020-05-08 DIAGNOSIS — M5136 Other intervertebral disc degeneration, lumbar region: Secondary | ICD-10-CM | POA: Diagnosis not present

## 2020-05-08 DIAGNOSIS — Z6825 Body mass index (BMI) 25.0-25.9, adult: Secondary | ICD-10-CM | POA: Diagnosis not present

## 2020-05-08 DIAGNOSIS — R030 Elevated blood-pressure reading, without diagnosis of hypertension: Secondary | ICD-10-CM | POA: Diagnosis not present

## 2020-05-08 DIAGNOSIS — M48062 Spinal stenosis, lumbar region with neurogenic claudication: Secondary | ICD-10-CM

## 2020-05-08 DIAGNOSIS — M5126 Other intervertebral disc displacement, lumbar region: Secondary | ICD-10-CM | POA: Diagnosis not present

## 2020-05-13 ENCOUNTER — Encounter: Payer: Medicare Other | Admitting: Physical Therapy

## 2020-05-20 ENCOUNTER — Encounter: Payer: Medicare Other | Admitting: Physical Therapy

## 2020-05-29 DIAGNOSIS — M1711 Unilateral primary osteoarthritis, right knee: Secondary | ICD-10-CM | POA: Diagnosis not present

## 2020-06-04 DIAGNOSIS — Z96651 Presence of right artificial knee joint: Secondary | ICD-10-CM | POA: Diagnosis not present

## 2020-06-04 DIAGNOSIS — M6281 Muscle weakness (generalized): Secondary | ICD-10-CM | POA: Diagnosis not present

## 2020-06-04 DIAGNOSIS — M25661 Stiffness of right knee, not elsewhere classified: Secondary | ICD-10-CM | POA: Diagnosis not present

## 2020-06-09 DIAGNOSIS — H18413 Arcus senilis, bilateral: Secondary | ICD-10-CM | POA: Diagnosis not present

## 2020-06-09 DIAGNOSIS — M6281 Muscle weakness (generalized): Secondary | ICD-10-CM | POA: Diagnosis not present

## 2020-06-09 DIAGNOSIS — H26491 Other secondary cataract, right eye: Secondary | ICD-10-CM | POA: Diagnosis not present

## 2020-06-09 DIAGNOSIS — Z96651 Presence of right artificial knee joint: Secondary | ICD-10-CM | POA: Diagnosis not present

## 2020-06-09 DIAGNOSIS — H02831 Dermatochalasis of right upper eyelid: Secondary | ICD-10-CM | POA: Diagnosis not present

## 2020-06-09 DIAGNOSIS — M25661 Stiffness of right knee, not elsewhere classified: Secondary | ICD-10-CM | POA: Diagnosis not present

## 2020-06-09 DIAGNOSIS — Z961 Presence of intraocular lens: Secondary | ICD-10-CM | POA: Diagnosis not present

## 2020-06-10 DIAGNOSIS — E119 Type 2 diabetes mellitus without complications: Secondary | ICD-10-CM | POA: Diagnosis not present

## 2020-06-10 DIAGNOSIS — E785 Hyperlipidemia, unspecified: Secondary | ICD-10-CM | POA: Diagnosis not present

## 2020-06-11 DIAGNOSIS — Z96651 Presence of right artificial knee joint: Secondary | ICD-10-CM | POA: Diagnosis not present

## 2020-06-11 DIAGNOSIS — M25661 Stiffness of right knee, not elsewhere classified: Secondary | ICD-10-CM | POA: Diagnosis not present

## 2020-06-11 DIAGNOSIS — M6281 Muscle weakness (generalized): Secondary | ICD-10-CM | POA: Diagnosis not present

## 2020-06-13 ENCOUNTER — Ambulatory Visit: Payer: Medicare Other | Admitting: Cardiology

## 2020-06-16 ENCOUNTER — Other Ambulatory Visit: Payer: Self-pay

## 2020-06-16 ENCOUNTER — Ambulatory Visit
Admission: RE | Admit: 2020-06-16 | Discharge: 2020-06-16 | Disposition: A | Discharge status: Home / Self Care | Payer: Medicare Other | Source: Ambulatory Visit | Attending: Neurosurgery | Admitting: Neurosurgery

## 2020-06-16 DIAGNOSIS — M48061 Spinal stenosis, lumbar region without neurogenic claudication: Secondary | ICD-10-CM | POA: Diagnosis not present

## 2020-06-16 DIAGNOSIS — M25661 Stiffness of right knee, not elsewhere classified: Secondary | ICD-10-CM | POA: Diagnosis not present

## 2020-06-16 DIAGNOSIS — M6281 Muscle weakness (generalized): Secondary | ICD-10-CM | POA: Diagnosis not present

## 2020-06-16 DIAGNOSIS — Z96651 Presence of right artificial knee joint: Secondary | ICD-10-CM | POA: Diagnosis not present

## 2020-06-16 DIAGNOSIS — M48062 Spinal stenosis, lumbar region with neurogenic claudication: Secondary | ICD-10-CM

## 2020-06-16 DIAGNOSIS — Z961 Presence of intraocular lens: Secondary | ICD-10-CM | POA: Diagnosis not present

## 2020-06-17 DIAGNOSIS — E785 Hyperlipidemia, unspecified: Secondary | ICD-10-CM | POA: Diagnosis not present

## 2020-06-17 DIAGNOSIS — E119 Type 2 diabetes mellitus without complications: Secondary | ICD-10-CM | POA: Diagnosis not present

## 2020-06-17 DIAGNOSIS — Z8709 Personal history of other diseases of the respiratory system: Secondary | ICD-10-CM | POA: Diagnosis not present

## 2020-06-17 DIAGNOSIS — Z6830 Body mass index (BMI) 30.0-30.9, adult: Secondary | ICD-10-CM | POA: Diagnosis not present

## 2020-06-18 DIAGNOSIS — M25661 Stiffness of right knee, not elsewhere classified: Secondary | ICD-10-CM | POA: Diagnosis not present

## 2020-06-18 DIAGNOSIS — M6281 Muscle weakness (generalized): Secondary | ICD-10-CM | POA: Diagnosis not present

## 2020-06-18 DIAGNOSIS — Z96651 Presence of right artificial knee joint: Secondary | ICD-10-CM | POA: Diagnosis not present

## 2020-06-23 DIAGNOSIS — M6281 Muscle weakness (generalized): Secondary | ICD-10-CM | POA: Diagnosis not present

## 2020-06-23 DIAGNOSIS — Z96651 Presence of right artificial knee joint: Secondary | ICD-10-CM | POA: Diagnosis not present

## 2020-06-23 DIAGNOSIS — M25661 Stiffness of right knee, not elsewhere classified: Secondary | ICD-10-CM | POA: Diagnosis not present

## 2020-06-25 DIAGNOSIS — M25661 Stiffness of right knee, not elsewhere classified: Secondary | ICD-10-CM | POA: Diagnosis not present

## 2020-06-25 DIAGNOSIS — Z96651 Presence of right artificial knee joint: Secondary | ICD-10-CM | POA: Diagnosis not present

## 2020-06-25 DIAGNOSIS — M6281 Muscle weakness (generalized): Secondary | ICD-10-CM | POA: Diagnosis not present

## 2020-06-26 DIAGNOSIS — M47816 Spondylosis without myelopathy or radiculopathy, lumbar region: Secondary | ICD-10-CM | POA: Diagnosis not present

## 2020-06-30 DIAGNOSIS — Z96651 Presence of right artificial knee joint: Secondary | ICD-10-CM | POA: Diagnosis not present

## 2020-06-30 DIAGNOSIS — M6281 Muscle weakness (generalized): Secondary | ICD-10-CM | POA: Diagnosis not present

## 2020-06-30 DIAGNOSIS — M25661 Stiffness of right knee, not elsewhere classified: Secondary | ICD-10-CM | POA: Diagnosis not present

## 2020-07-02 DIAGNOSIS — M25661 Stiffness of right knee, not elsewhere classified: Secondary | ICD-10-CM | POA: Diagnosis not present

## 2020-07-02 DIAGNOSIS — H26492 Other secondary cataract, left eye: Secondary | ICD-10-CM | POA: Diagnosis not present

## 2020-07-02 DIAGNOSIS — Z96651 Presence of right artificial knee joint: Secondary | ICD-10-CM | POA: Diagnosis not present

## 2020-07-02 DIAGNOSIS — M6281 Muscle weakness (generalized): Secondary | ICD-10-CM | POA: Diagnosis not present

## 2020-07-07 DIAGNOSIS — M25661 Stiffness of right knee, not elsewhere classified: Secondary | ICD-10-CM | POA: Diagnosis not present

## 2020-07-07 DIAGNOSIS — Z96651 Presence of right artificial knee joint: Secondary | ICD-10-CM | POA: Diagnosis not present

## 2020-07-07 DIAGNOSIS — M6281 Muscle weakness (generalized): Secondary | ICD-10-CM | POA: Diagnosis not present

## 2020-07-10 DIAGNOSIS — Z961 Presence of intraocular lens: Secondary | ICD-10-CM | POA: Diagnosis not present

## 2020-07-16 DIAGNOSIS — Z961 Presence of intraocular lens: Secondary | ICD-10-CM | POA: Diagnosis not present

## 2020-07-16 DIAGNOSIS — H18413 Arcus senilis, bilateral: Secondary | ICD-10-CM | POA: Diagnosis not present

## 2020-07-16 DIAGNOSIS — H18593 Other hereditary corneal dystrophies, bilateral: Secondary | ICD-10-CM | POA: Diagnosis not present

## 2020-07-16 DIAGNOSIS — H16223 Keratoconjunctivitis sicca, not specified as Sjogren's, bilateral: Secondary | ICD-10-CM | POA: Diagnosis not present

## 2020-07-16 DIAGNOSIS — M4155 Other secondary scoliosis, thoracolumbar region: Secondary | ICD-10-CM | POA: Diagnosis not present

## 2020-07-21 ENCOUNTER — Encounter (HOSPITAL_COMMUNITY): Payer: Self-pay | Admitting: Emergency Medicine

## 2020-07-21 ENCOUNTER — Other Ambulatory Visit: Payer: Self-pay

## 2020-07-21 ENCOUNTER — Ambulatory Visit (HOSPITAL_COMMUNITY)
Admission: EM | Admit: 2020-07-21 | Discharge: 2020-07-21 | Disposition: A | Discharge status: Home / Self Care | Payer: Medicare Other | Attending: Family Medicine | Admitting: Family Medicine

## 2020-07-21 DIAGNOSIS — L255 Unspecified contact dermatitis due to plants, except food: Secondary | ICD-10-CM | POA: Diagnosis not present

## 2020-07-21 MED ORDER — METHYLPREDNISOLONE SODIUM SUCC 125 MG IJ SOLR
INTRAMUSCULAR | Status: AC
  Filled 2020-07-21: qty 2

## 2020-07-21 MED ORDER — PREDNISONE 10 MG (48) PO TBPK
ORAL_TABLET | ORAL | 0 refills | Status: DC
Start: 2020-07-21 — End: 2020-10-24

## 2020-07-21 MED ORDER — METHYLPREDNISOLONE SODIUM SUCC 125 MG IJ SOLR
125.0000 mg | Freq: Once | INTRAMUSCULAR | Status: AC
  Administered 2020-07-21: 125 mg via INTRAMUSCULAR

## 2020-07-21 NOTE — ED Triage Notes (Signed)
Pt presents with poison ivy xs 2 days.   States has taken benadryl with very little relief.

## 2020-07-21 NOTE — ED Provider Notes (Signed)
[facility]   [id number] 07/21/20 Arrival Time: 2831  ASSESSMENT & PLAN:  1. Rhus dermatitis     Meds ordered this encounter  Medications  . predniSONE (STERAPRED UNI-PAK 48 TAB) 10 MG (48) TBPK tablet    Sig: Take as directed.    Dispense:  48 tablet    Refill:  0  . methylPREDNISolone sodium succinate (SOLU-MEDROL) 125 mg/2 mL injection 125 mg    No signs of bacterial skin infection.  Will follow up with PCP or here if worsening or failing to improve as anticipated. Reviewed expectations re: course of current medical issues. Questions answered. Outlined signs and symptoms indicating need for more acute intervention. Patient verbalized understanding. After Visit Summary given.   SUBJECTIVE:  Trevor Grimes is a 69 y.o. male who presents with a skin complaint. Thinks poison ivy. Rash with itching. Afebrile. OTC without relief. Also itching/rash of scrotum reported.    OBJECTIVE: Vitals:   07/21/20 0948  Pulse: 64  Resp: 18  Temp: 98.1 F (36.7 C)  TempSrc: Oral  SpO2: (!) 2%    General appearance: alert; no distress HEENT: [place]; AT Neck: supple with FROM Lungs: unlabored Skin: warm and dry; signs of infection: no; areas of linear papules and vesicles with surrounding erythema over bilateral forearms Psychological: alert and cooperative; normal mood and affect  No Known Allergies  Past Medical History:  Diagnosis Date  . Anxiety   . Arthritis   . Chronic back pain   . Depression   . Diabetes mellitus without complication ([place])   . Hypothyroidism   . Kidney stones    hx of  . Sleep apnea    Has CPAP machine, does not wear every night  . Thyroid disease    Social History   Socioeconomic History  . Marital status: Married    Spouse name: Not on file  . Number of children: Not on file  . Years of education: Not on file  . Highest education level: Not on file  Occupational History  . Not on file  Tobacco Use  . Smoking status: Never  Smoker  . Smokeless tobacco: Never Used  Substance and Sexual Activity  . Alcohol use: Yes    Comment: "occas"  . Drug use: No  . Sexual activity: Not on file  Other Topics Concern  . Not on file  Social History Narrative  . Not on file   Social Determinants of Health   Financial Resource Strain:   . Difficulty of Paying Living Expenses: Not on file  Food Insecurity:   . Worried About Charity fundraiser in the Last Year: Not on file  . Ran Out of Food in the Last Year: Not on file  Transportation Needs:   . Lack of Transportation (Medical): Not on file  . Lack of Transportation (Non-Medical): Not on file  Physical Activity:   . Days of Exercise per Week: Not on file  . Minutes of Exercise per Session: Not on file  Stress:   . Feeling of Stress : Not on file  Social Connections:   . Frequency of Communication with Friends and Family: Not on file  . Frequency of Social Gatherings with Friends and Family: Not on file  . Attends Religious Services: Not on file  . Active Member of Clubs or Organizations: Not on file  . Attends Archivist Meetings: Not on file  . Marital Status: Not on file  Intimate Partner Violence:   . Fear of  Current or Ex-Partner: Not on file  . Emotionally Abused: Not on file  . Physically Abused: Not on file  . Sexually Abused: Not on file   Family History  Problem Relation Age of Onset  . Liver disease Mother   . Diabetes Father    Past Surgical History:  Procedure Laterality Date  . AMPUTATION Right 01/25/2013   Procedure: RIGHT 1ST RAY AMPUTATION ;  Surgeon: Wylene Simmer, MD;  Location: [place];  Service: Orthopedics;  Laterality: Right;  . BACK SURGERY    . CERVICAL FUSION    . CHEILECTOMY Left 05/06/2016   Procedure: LEFT HALLUX METATARSAL PHALANGEAL JOINT CHEILECTOMY AND CARTIVA  RESURFACING;  Surgeon: Wylene Simmer, MD;  Location: [place];  Service: Orthopedics;  Laterality: Left;  . I & D EXTREMITY Right 01/25/2013    Procedure: IRRIGATION AND DEBRIDEMENT Right Hallux;  Surgeon: Wylene Simmer, MD;  Location: [place];  Service: Orthopedics;  Laterality: Right;  . KNEE SURGERY       Vanessa Kick, MD 07/21/20 1044

## 2020-07-23 DIAGNOSIS — M47816 Spondylosis without myelopathy or radiculopathy, lumbar region: Secondary | ICD-10-CM | POA: Diagnosis not present

## 2020-07-25 DIAGNOSIS — T148XXA Other injury of unspecified body region, initial encounter: Secondary | ICD-10-CM | POA: Diagnosis not present

## 2020-07-25 DIAGNOSIS — M779 Enthesopathy, unspecified: Secondary | ICD-10-CM | POA: Diagnosis not present

## 2020-08-04 DIAGNOSIS — M545 Low back pain: Secondary | ICD-10-CM | POA: Diagnosis not present

## 2020-08-04 DIAGNOSIS — M4155 Other secondary scoliosis, thoracolumbar region: Secondary | ICD-10-CM | POA: Diagnosis not present

## 2020-08-05 DIAGNOSIS — M25512 Pain in left shoulder: Secondary | ICD-10-CM | POA: Diagnosis not present

## 2020-08-08 DIAGNOSIS — M25512 Pain in left shoulder: Secondary | ICD-10-CM | POA: Diagnosis not present

## 2020-08-11 DIAGNOSIS — M1711 Unilateral primary osteoarthritis, right knee: Secondary | ICD-10-CM | POA: Diagnosis not present

## 2020-08-12 DIAGNOSIS — M1712 Unilateral primary osteoarthritis, left knee: Secondary | ICD-10-CM | POA: Diagnosis not present

## 2020-08-12 DIAGNOSIS — R197 Diarrhea, unspecified: Secondary | ICD-10-CM | POA: Diagnosis not present

## 2020-08-12 DIAGNOSIS — R791 Abnormal coagulation profile: Secondary | ICD-10-CM | POA: Diagnosis not present

## 2020-08-12 DIAGNOSIS — M25512 Pain in left shoulder: Secondary | ICD-10-CM | POA: Diagnosis not present

## 2020-08-12 DIAGNOSIS — Z01812 Encounter for preprocedural laboratory examination: Secondary | ICD-10-CM | POA: Diagnosis not present

## 2020-08-12 DIAGNOSIS — M25562 Pain in left knee: Secondary | ICD-10-CM | POA: Diagnosis not present

## 2020-08-13 DIAGNOSIS — M545 Low back pain: Secondary | ICD-10-CM | POA: Diagnosis not present

## 2020-08-13 DIAGNOSIS — M47816 Spondylosis without myelopathy or radiculopathy, lumbar region: Secondary | ICD-10-CM | POA: Diagnosis not present

## 2020-08-13 DIAGNOSIS — R197 Diarrhea, unspecified: Secondary | ICD-10-CM | POA: Diagnosis not present

## 2020-08-13 DIAGNOSIS — M4155 Other secondary scoliosis, thoracolumbar region: Secondary | ICD-10-CM | POA: Diagnosis not present

## 2020-08-26 DIAGNOSIS — M1712 Unilateral primary osteoarthritis, left knee: Secondary | ICD-10-CM | POA: Diagnosis not present

## 2020-08-26 DIAGNOSIS — E119 Type 2 diabetes mellitus without complications: Secondary | ICD-10-CM | POA: Diagnosis not present

## 2020-09-01 DIAGNOSIS — M1712 Unilateral primary osteoarthritis, left knee: Secondary | ICD-10-CM | POA: Diagnosis not present

## 2020-09-01 DIAGNOSIS — E119 Type 2 diabetes mellitus without complications: Secondary | ICD-10-CM | POA: Diagnosis not present

## 2020-09-01 DIAGNOSIS — Z01812 Encounter for preprocedural laboratory examination: Secondary | ICD-10-CM | POA: Diagnosis not present

## 2020-09-04 DIAGNOSIS — H52211 Irregular astigmatism, right eye: Secondary | ICD-10-CM | POA: Diagnosis not present

## 2020-09-04 DIAGNOSIS — H18891 Other specified disorders of cornea, right eye: Secondary | ICD-10-CM | POA: Diagnosis not present

## 2020-09-10 DIAGNOSIS — E119 Type 2 diabetes mellitus without complications: Secondary | ICD-10-CM | POA: Diagnosis not present

## 2020-09-10 DIAGNOSIS — Z79899 Other long term (current) drug therapy: Secondary | ICD-10-CM | POA: Diagnosis not present

## 2020-09-10 DIAGNOSIS — E785 Hyperlipidemia, unspecified: Secondary | ICD-10-CM | POA: Diagnosis not present

## 2020-09-11 DIAGNOSIS — Z23 Encounter for immunization: Secondary | ICD-10-CM | POA: Diagnosis not present

## 2020-09-17 DIAGNOSIS — Z6830 Body mass index (BMI) 30.0-30.9, adult: Secondary | ICD-10-CM | POA: Diagnosis not present

## 2020-09-17 DIAGNOSIS — Z8709 Personal history of other diseases of the respiratory system: Secondary | ICD-10-CM | POA: Diagnosis not present

## 2020-09-17 DIAGNOSIS — E119 Type 2 diabetes mellitus without complications: Secondary | ICD-10-CM | POA: Diagnosis not present

## 2020-09-17 DIAGNOSIS — E785 Hyperlipidemia, unspecified: Secondary | ICD-10-CM | POA: Diagnosis not present

## 2020-09-29 DIAGNOSIS — E7800 Pure hypercholesterolemia, unspecified: Secondary | ICD-10-CM | POA: Diagnosis not present

## 2020-09-29 DIAGNOSIS — E1165 Type 2 diabetes mellitus with hyperglycemia: Secondary | ICD-10-CM | POA: Diagnosis not present

## 2020-09-29 DIAGNOSIS — E538 Deficiency of other specified B group vitamins: Secondary | ICD-10-CM | POA: Diagnosis not present

## 2020-09-29 DIAGNOSIS — Z125 Encounter for screening for malignant neoplasm of prostate: Secondary | ICD-10-CM | POA: Diagnosis not present

## 2020-09-29 DIAGNOSIS — E039 Hypothyroidism, unspecified: Secondary | ICD-10-CM | POA: Diagnosis not present

## 2020-10-06 DIAGNOSIS — Z8639 Personal history of other endocrine, nutritional and metabolic disease: Secondary | ICD-10-CM | POA: Diagnosis not present

## 2020-10-06 DIAGNOSIS — G4733 Obstructive sleep apnea (adult) (pediatric): Secondary | ICD-10-CM | POA: Diagnosis not present

## 2020-10-06 DIAGNOSIS — Z89411 Acquired absence of right great toe: Secondary | ICD-10-CM | POA: Diagnosis not present

## 2020-10-06 DIAGNOSIS — M5090 Cervical disc disorder, unspecified, unspecified cervical region: Secondary | ICD-10-CM | POA: Diagnosis not present

## 2020-10-06 DIAGNOSIS — Z0000 Encounter for general adult medical examination without abnormal findings: Secondary | ICD-10-CM | POA: Diagnosis not present

## 2020-10-06 DIAGNOSIS — F339 Major depressive disorder, recurrent, unspecified: Secondary | ICD-10-CM | POA: Diagnosis not present

## 2020-10-06 DIAGNOSIS — D7589 Other specified diseases of blood and blood-forming organs: Secondary | ICD-10-CM | POA: Diagnosis not present

## 2020-10-06 DIAGNOSIS — M519 Unspecified thoracic, thoracolumbar and lumbosacral intervertebral disc disorder: Secondary | ICD-10-CM | POA: Diagnosis not present

## 2020-10-06 DIAGNOSIS — E039 Hypothyroidism, unspecified: Secondary | ICD-10-CM | POA: Diagnosis not present

## 2020-10-06 DIAGNOSIS — E1165 Type 2 diabetes mellitus with hyperglycemia: Secondary | ICD-10-CM | POA: Diagnosis not present

## 2020-10-06 DIAGNOSIS — E7800 Pure hypercholesterolemia, unspecified: Secondary | ICD-10-CM | POA: Diagnosis not present

## 2020-10-06 DIAGNOSIS — M8588 Other specified disorders of bone density and structure, other site: Secondary | ICD-10-CM | POA: Diagnosis not present

## 2020-10-13 DIAGNOSIS — M47816 Spondylosis without myelopathy or radiculopathy, lumbar region: Secondary | ICD-10-CM | POA: Diagnosis not present

## 2020-10-20 DIAGNOSIS — M47816 Spondylosis without myelopathy or radiculopathy, lumbar region: Secondary | ICD-10-CM | POA: Diagnosis not present

## 2020-10-22 DIAGNOSIS — E119 Type 2 diabetes mellitus without complications: Secondary | ICD-10-CM | POA: Diagnosis not present

## 2020-10-22 DIAGNOSIS — Z01812 Encounter for preprocedural laboratory examination: Secondary | ICD-10-CM | POA: Diagnosis not present

## 2020-10-22 DIAGNOSIS — M1712 Unilateral primary osteoarthritis, left knee: Secondary | ICD-10-CM | POA: Diagnosis not present

## 2020-10-22 DIAGNOSIS — R7309 Other abnormal glucose: Secondary | ICD-10-CM | POA: Diagnosis not present

## 2020-10-23 DIAGNOSIS — M47816 Spondylosis without myelopathy or radiculopathy, lumbar region: Secondary | ICD-10-CM | POA: Diagnosis not present

## 2020-10-27 ENCOUNTER — Encounter (HOSPITAL_COMMUNITY): Payer: Self-pay

## 2020-10-27 ENCOUNTER — Other Ambulatory Visit: Payer: Self-pay

## 2020-10-27 ENCOUNTER — Emergency Department (HOSPITAL_COMMUNITY)
Admission: EM | Admit: 2020-10-27 | Discharge: 2020-10-27 | Disposition: A | Discharge status: Home / Self Care | Payer: Medicare Other | Attending: Emergency Medicine | Admitting: Emergency Medicine

## 2020-10-27 ENCOUNTER — Emergency Department (HOSPITAL_COMMUNITY): Payer: Medicare Other

## 2020-10-27 DIAGNOSIS — E119 Type 2 diabetes mellitus without complications: Secondary | ICD-10-CM | POA: Diagnosis not present

## 2020-10-27 DIAGNOSIS — E039 Hypothyroidism, unspecified: Secondary | ICD-10-CM | POA: Diagnosis not present

## 2020-10-27 DIAGNOSIS — Z87442 Personal history of urinary calculi: Secondary | ICD-10-CM | POA: Insufficient documentation

## 2020-10-27 DIAGNOSIS — N200 Calculus of kidney: Secondary | ICD-10-CM | POA: Insufficient documentation

## 2020-10-27 DIAGNOSIS — R1110 Vomiting, unspecified: Secondary | ICD-10-CM | POA: Diagnosis not present

## 2020-10-27 DIAGNOSIS — R109 Unspecified abdominal pain: Secondary | ICD-10-CM | POA: Diagnosis not present

## 2020-10-27 DIAGNOSIS — I10 Essential (primary) hypertension: Secondary | ICD-10-CM | POA: Diagnosis not present

## 2020-10-27 LAB — COMPREHENSIVE METABOLIC PANEL
ALT: 27 U/L (ref 0–44)
AST: 23 U/L (ref 15–41)
Albumin: 4.6 g/dL (ref 3.5–5.0)
Alkaline Phosphatase: 69 U/L (ref 38–126)
Anion gap: 11 (ref 5–15)
BUN: 26 mg/dL — ABNORMAL HIGH (ref 8–23)
CO2: 26 mmol/L (ref 22–32)
Calcium: 9.7 mg/dL (ref 8.9–10.3)
Chloride: 102 mmol/L (ref 98–111)
Creatinine, Ser: 0.9 mg/dL (ref 0.61–1.24)
GFR, Estimated: >60 mL/min (ref >60)
Glucose, Bld: 315 mg/dL — ABNORMAL HIGH (ref 70–99)
Potassium: 4.1 mmol/L (ref 3.5–5.1)
Sodium: 139 mmol/L (ref 135–145)
Total Bilirubin: 1 mg/dL (ref 0.3–1.2)
Total Protein: 7.3 g/dL (ref 6.5–8.1)

## 2020-10-27 LAB — URINALYSIS, ROUTINE W REFLEX MICROSCOPIC
Bacteria, UA: NONE SEEN
Bilirubin Urine: NEGATIVE
Glucose, UA: >=500 mg/dL — AB
Ketones, ur: 5 mg/dL — AB
Leukocytes,Ua: NEGATIVE
Nitrite: NEGATIVE
Protein, ur: 30 mg/dL — AB
Specific Gravity, Urine: 1.018 (ref 1.005–1.030)
pH: 6 (ref 5.0–8.0)

## 2020-10-27 LAB — CBC WITH DIFFERENTIAL/PLATELET
Abs Immature Granulocytes: 0.09 10*3/uL — ABNORMAL HIGH (ref 0.00–0.07)
Basophils Absolute: 0.1 10*3/uL (ref 0.0–0.1)
Basophils Relative: 0 %
Eosinophils Absolute: 0 10*3/uL (ref 0.0–0.5)
Eosinophils Relative: 0 %
HCT: 43.7 % (ref 39.0–52.0)
Hemoglobin: 14.3 g/dL (ref 13.0–17.0)
Immature Granulocytes: 1 %
Lymphocytes Relative: 12 %
Lymphs Abs: 2.3 10*3/uL (ref 0.7–4.0)
MCH: 32.7 pg (ref 26.0–34.0)
MCHC: 32.7 g/dL (ref 30.0–36.0)
MCV: 100 fL (ref 80.0–100.0)
Monocytes Absolute: 1.4 10*3/uL — ABNORMAL HIGH (ref 0.1–1.0)
Monocytes Relative: 7 %
Neutro Abs: 15 10*3/uL — ABNORMAL HIGH (ref 1.7–7.7)
Neutrophils Relative %: 80 %
Platelets: 186 10*3/uL (ref 150–400)
RBC: 4.37 MIL/uL (ref 4.22–5.81)
RDW: 13.3 % (ref 11.5–15.5)
WBC: 18.8 10*3/uL — ABNORMAL HIGH (ref 4.0–10.5)
nRBC: 0 % (ref 0.0–0.2)

## 2020-10-27 LAB — LIPASE, BLOOD: Lipase: 31 U/L (ref 11–51)

## 2020-10-27 MED ORDER — ONDANSETRON HCL 4 MG PO TABS: 4.0000 mg | ORAL_TABLET | Freq: Three times a day (TID) | ORAL | 0 refills | Status: DC | PRN

## 2020-10-27 MED ORDER — MORPHINE SULFATE (PF) 4 MG/ML IV SOLN
4.0000 mg | Freq: Once | INTRAVENOUS | Status: AC
  Administered 2020-10-27: 4 mg via INTRAVENOUS
  Filled 2020-10-27: qty 1

## 2020-10-27 MED ORDER — TAMSULOSIN HCL 0.4 MG PO CAPS: 0.4000 mg | ORAL_CAPSULE | Freq: Every day | ORAL | 0 refills | Status: DC

## 2020-10-27 MED ORDER — ONDANSETRON HCL 4 MG/2ML IJ SOLN
4.0000 mg | Freq: Once | INTRAMUSCULAR | Status: AC
  Administered 2020-10-27: 4 mg via INTRAVENOUS
  Filled 2020-10-27: qty 2

## 2020-10-27 MED ORDER — IOHEXOL 300 MG/ML  SOLN
100.0000 mL | Freq: Once | INTRAMUSCULAR | Status: AC | PRN
  Administered 2020-10-27: 100 mL via INTRAVENOUS

## 2020-10-27 NOTE — ED Provider Notes (Signed)
[facility] DEPT Provider Note   CSN: [id number] Arrival date & time: 10/27/20  1312     History Chief Complaint  Patient presents with  . Abdominal Pain  . Nausea  . Hypertension    Trevor Grimes is a 69 y.o. male presenting for evaluation of abdominal pain, nausea, vomiting.  Patient states his pain began 2 days ago.  Pain is mostly on the right side, in his low back and anterior abdomen.  Today he developed nausea and vomiting.  He states pain is severe, comes in waves.  He has not taken anything for it.  He took meclizine for nausea without improvement.  He denies fevers, chills, chest pain, breath, cough, urinary symptoms, normal bowel movements.  Pain does not change with urination or bowel movements.  Does not change with p.o. intake.  Reports a history of kidney stones, the last was about 10 years ago, does not know if this is similar, no history of diverticulitis.  No previous history of abdominal surgeries.  He reports a history of diabetes, hypothyroidism, chronic pain.  No new medications recently.  HPI     Past Medical History:  Diagnosis Date  . Anxiety   . Arthritis   . Chronic back pain   . Depression   . Diabetes mellitus without complication ([place])   . Hypothyroidism   . Kidney stones    hx of  . Sleep apnea    Has CPAP machine, does not wear every night  . Thyroid disease     Patient Active Problem List   Diagnosis Date Noted  . Shortness of breath 03/13/2020    Past Surgical History:  Procedure Laterality Date  . AMPUTATION Right 01/25/2013   Procedure: RIGHT 1ST RAY AMPUTATION ;  Surgeon: Wylene Simmer, MD;  Location: [facility];  Service: Orthopedics;  Laterality: Right;  . BACK SURGERY    . CERVICAL FUSION    . CHEILECTOMY Left 05/06/2016   Procedure: LEFT HALLUX METATARSAL PHALANGEAL JOINT CHEILECTOMY AND CARTIVA  RESURFACING;  Surgeon: Wylene Simmer, MD;  Location: [facility];  Service: Orthopedics;   Laterality: Left;  . I & D EXTREMITY Right 01/25/2013   Procedure: IRRIGATION AND DEBRIDEMENT Right Hallux;  Surgeon: Wylene Simmer, MD;  Location: [facility];  Service: Orthopedics;  Laterality: Right;  . KNEE SURGERY         Family History  Problem Relation Age of Onset  . Liver disease Mother   . Diabetes Father     Social History   Tobacco Use  . Smoking status: Never Smoker  . Smokeless tobacco: Never Used  Vaping Use  . Vaping Use: Never used  Substance Use Topics  . Alcohol use: Yes    Comment: "occas"  . Drug use: No    Home Medications Prior to Admission medications   Medication Sig Start Date End Date Taking? Authorizing Provider  ACCU-CHEK SMARTVIEW test strip  12/27/12   [provider]  atorvastatin (LIPITOR) 20 MG tablet Take 20 mg by mouth daily.     [provider]  cholecalciferol (VITAMIN D3) 25 MCG (1000 UNIT) tablet Take 1,000 Units by mouth daily.    [provider]  cyclobenzaprine (FLEXERIL) 10 MG tablet Take 10 mg by mouth 3 (three) times daily as needed for muscle spasms.     [provider]  glipiZIDE (GLUCOTROL) 5 MG tablet Take 5 mg by mouth 2 (two) times daily before a meal.     [provider]  HYDROcodone-acetaminophen (NORCO/VICODIN) 5-325 MG tablet Take 1 tablet by mouth every 6 (six) hours as needed for moderate pain.    [provider]  levothyroxine (SYNTHROID) 50 MCG tablet Take 50 mcg by mouth daily before breakfast.     [provider]  meloxicam (MOBIC) 15 MG tablet Take 15 mg by mouth daily.     [provider]  metFORMIN (GLUCOPHAGE) 1000 MG tablet Take 1,000 mg by mouth in the morning, at noon, in the evening, and at bedtime.     [provider]  ondansetron (ZOFRAN ODT) 4 MG disintegrating tablet 4mg  ODT q4 hours prn nausea/vomit Patient taking differently: Take 4 mg by mouth every 4 (four) hours as needed for nausea or vomiting. 4mg  ODT q4 hours prn nausea/vomit  03/15/20   Deno Etienne, DO  ondansetron (ZOFRAN) 4 MG tablet Take 1 tablet (4 mg total) by mouth every 8 (eight) hours as needed for nausea or vomiting. 10/27/20   Denijah Karrer, PA-C  OPTIVE 0.5-0.9 % ophthalmic solution Place 1 drop into both eyes 2 (two) times daily.  01/31/20   [provider]  sertraline (ZOLOFT) 50 MG tablet Take 50 mg by mouth daily.    [provider]  tamsulosin (FLOMAX) 0.4 MG CAPS capsule Take 1 capsule (0.4 mg total) by mouth daily. 10/27/20   Omran Keelin, PA-C    Allergies    Patient has no known allergies.  Review of Systems   Review of Systems  Gastrointestinal: Positive for abdominal pain, nausea and vomiting.  All other systems reviewed and are negative.   Physical Exam Updated Vital Signs BP (!) 154/76   Pulse 60   Temp 98.1 F (36.7 C) (Oral)   Resp 18   Ht 5\' 10"  (1.778 m)   Wt 90.7 kg   SpO2 99%   BMI 28.70 kg/m   Physical Exam Vitals and nursing note reviewed.  Constitutional:      General: He is not in acute distress.    Appearance: He is well-developed.     Comments: Appears uncomfortable due to pain.   HENT:     Head: Normocephalic and atraumatic.  Eyes:     Extraocular Movements: Extraocular movements intact.     Conjunctiva/sclera: Conjunctivae normal.     Pupils: Pupils are equal, round, and reactive to light.  Cardiovascular:     Rate and Rhythm: Normal rate and regular rhythm.     Pulses: Normal pulses.  Pulmonary:     Effort: Pulmonary effort is normal. No respiratory distress.     Breath sounds: Normal breath sounds. No wheezing.  Abdominal:     General: There is no distension.     Palpations: Abdomen is soft. There is no mass.     Tenderness: There is abdominal tenderness. There is right CVA tenderness. There is no guarding or rebound.     Comments: Tenderness palpation of the right upper and lower quadrants of the abdomen.  Right CVA tenderness and tenderness palpation of the right low back.   No rigidity or distention.  No rebound.  No peritonitis.  Musculoskeletal:        General: Normal range of motion.     Cervical back: Normal range of motion and neck supple.  Skin:    General: Skin is warm and dry.     Capillary Refill: Capillary refill takes less than 2 seconds.  Neurological:     Mental Status: He is alert and oriented to person, place, and  time.     ED Results / Procedures / Treatments   Labs (all labs ordered are listed, but only abnormal results are displayed) Labs Reviewed  CBC WITH DIFFERENTIAL/PLATELET - Abnormal; Notable for the following components:      Result Value   WBC 18.8 (*)    Neutro Abs 15.0 (*)    Monocytes Absolute 1.4 (*)    Abs Immature Granulocytes 0.09 (*)    All other components within normal limits  COMPREHENSIVE METABOLIC PANEL - Abnormal; Notable for the following components:   Glucose, Bld 315 (*)    BUN 26 (*)    All other components within normal limits  URINALYSIS, ROUTINE W REFLEX MICROSCOPIC - Abnormal; Notable for the following components:   Glucose, UA >=500 (*)    Hgb urine dipstick MODERATE (*)    Ketones, ur 5 (*)    Protein, ur 30 (*)    All other components within normal limits  LIPASE, BLOOD    EKG EKG Interpretation  Date/Time:  Monday October 27 2020 17:03:24 EST Ventricular Rate:  60 PR Interval:    QRS Duration: 93 QT Interval:  422 QTC Calculation: 422 R Axis:   77 Text Interpretation: Sinus rhythm No significant change since prior 4/21 Confirmed by Aletta Edouard [phone]) on 10/27/2020 5:07:45 PM   Radiology CT ABDOMEN PELVIS W CONTRAST  Result Date: 10/27/2020 CLINICAL DATA:  Nausea vomiting abdominal pain EXAM: CT ABDOMEN AND PELVIS WITH CONTRAST TECHNIQUE: Multidetector CT imaging of the abdomen and pelvis was performed using the standard protocol following bolus administration of intravenous contrast. CONTRAST:  142mL OMNIPAQUE IOHEXOL 300 MG/ML  SOLN COMPARISON:  None. FINDINGS: Lower chest: The  visualized heart size within normal limits. Coronary artery calcifications are seen. No pericardial fluid/thickening. No hiatal hernia. The visualized portions of the lungs are clear. Hepatobiliary: There is a 1.5 cm low-density lesion in the posterior right liver lobe. The main portal vein is patent. No evidence of calcified gallstones, gallbladder wall thickening or biliary dilatation. Pancreas: Unremarkable. No pancreatic ductal dilatation or surrounding inflammatory changes. Spleen: Normal in size without focal abnormality. Adrenals/Urinary Tract: Both adrenal glands appear normal. There is right-sided pelvicaliectasis with perinephric and periureteral stranding to the UVJ where there is a 4 mm calculus present. No left-sided renal collecting system calculi noted. Bladder is unremarkable. Stomach/Bowel: The stomach, small bowel, and colon are normal in appearance. No inflammatory changes, wall thickening, or obstructive findings.The appendix is normal. Vascular/Lymphatic: There are no enlarged mesenteric, retroperitoneal, or pelvic lymph nodes. Scattered aortic atherosclerotic calcifications are seen without aneurysmal dilatation. Reproductive: The prostate is unremarkable. Other: No evidence of abdominal wall mass. Fat containing inguinal hernias are noted. Musculoskeletal: No acute or significant osseous findings. IMPRESSION: Mild right hydronephrosis to the UVJ where there is a 4 mm calculus present. There is significant right-sided perinephric stranding which may be due to the recently passed stone versus mild pyelonephritis. Aortic Atherosclerosis (ICD10-I70.0). Electronically Signed   By: Prudencio Pair M.D.   On: 10/27/2020 19:25    Procedures Procedures (including critical care time)  Medications Ordered in ED Medications  ondansetron (ZOFRAN) injection 4 mg (4 mg Intravenous Given 10/27/20 1703)  morphine 4 MG/ML injection 4 mg (4 mg Intravenous Given 10/27/20 1702)  iohexol (OMNIPAQUE) 300 MG/ML  solution 100 mL (100 mLs Intravenous Contrast Given 10/27/20 1859)    ED Course  I have reviewed the triage vital signs and the nursing notes.  Pertinent labs & imaging results that were available during my care  of the patient were reviewed by me and considered in my medical decision making (see chart for details).  Clinical Course as of Oct 27 1949  Mon Oct 28, 3939  1080 69 year old male complaining some vague abdominal lower discomfort for a few days for that acutely worsened today.  No urinary symptoms but states he has had similar pain in the past with a kidney stone.  Has elevated white count.  Getting CT imaging.  Disposition per results of testing.   [MB]    Clinical Course User Index [MB] Hayden Rasmussen, MD   MDM Rules/Calculators/A&P                          Patient presenting for evaluation abdominal pain, nausea, vomiting.  On exam, patient appears uncomfortable due to pain, otherwise nontoxic.  Consider kidney stone versus appendicitis versus gallbladder etiology versus intestinal issues such as diverticulitis.  He will need labs, CT, symptom control and reassessment.  Labs interpreted by me, leukocytosis of 18.  CMP overall reassuring despite mild hyperglycemia, no DKA.  Urine shows blood, no sign of infection.  CT consistent with kidney stone, no other intra-abdominal infection.  On reassessment, patient reports symptoms are much improved.  He has no pain.  Blood pressure is improved significantly.  Discussed findings of kidney stone, symptomatic treatment at home, and follow-up with urology.  He already follows with Dr. Gloriann Loan.  At this time, patient appears safe for discharge.  Return precautions given.  Patient states he understands and agrees to plan.  Final Clinical Impression(s) / ED Diagnoses Final diagnoses:  Kidney stone    Rx / DC Orders ED Discharge Orders         Ordered    ondansetron (ZOFRAN) 4 MG tablet  Every 8 hours PRN        10/27/20 1940     tamsulosin (FLOMAX) 0.4 MG CAPS capsule  Daily        10/27/20 1940           Franchot Heidelberg, PA-C 10/27/20 1955    Hayden Rasmussen, MD 10/28/20 1001

## 2020-10-27 NOTE — ED Triage Notes (Signed)
Patient c/o lower abdominal pain and nausea x 3-4 hours. Patient reports he took Reglan prior to coming to the ED today. patient states he had a normal BM this AM.

## 2020-10-27 NOTE — Discharge Instructions (Addendum)
Continue taking her medication as prescribed. Make sure stay well-hydrated water. Take Flomax daily. Use Zofran as needed for nausea or vomiting. Use ibuprofen as needed for mild to moderate pain.  You may take 3 to 4 tablets at a time with food.  You may take this up to 3 times a day as needed for pain. Use Norco as needed for severe breakthrough pain. Follow-up with Dr. Gloriann Loan for further evaluation. Return to the emergency room if you develop fevers, persistent vomiting, severe worsening pain, inability to urinate, or any new, worsening, or concerning symptoms

## 2020-10-27 NOTE — ED Notes (Signed)
An After Visit Summary was printed and given to the patient. Discharge instructions given and no further questions at this time.  

## 2020-10-30 NOTE — Patient Instructions (Addendum)
DUE TO COVID-19 ONLY ONE VISITOR IS ALLOWED TO COME WITH YOU AND STAY IN THE WAITING ROOM ONLY DURING PRE OP AND PROCEDURE DAY OF SURGERY. THE 1 VISITOR  MAY VISIT WITH YOU AFTER SURGERY IN YOUR PRIVATE ROOM DURING VISITING HOURS ONLY!  YOU NEED TO HAVE A COVID 19 TEST ON__12/18_____ @_9 :30______, THIS TEST MUST BE DONE BEFORE SURGERY,  COVID TESTING SITE [place] [id number], IT IS ON THE RIGHT GOING OUT [address] APPROXIMATELY  2 MINUTES PAST [employer] ON THE RIGHT. ONCE YOUR COVID TEST IS COMPLETED,  PLEASE BEGIN THE QUARANTINE INSTRUCTIONS AS OUTLINED IN YOUR HANDOUT.                Karleen Dolphin    Your procedure is scheduled on: 11/12/20   Report to [facility] Main  Entrance   Report to short stay at 5:30 AM     Call this number if you have problems the morning of surgery [place], NO CHEWING GUM [place].  No food after midnight.    You may have clear liquid until 4:30 AM.    At 4:00 AM drink pre surgery drink.   Nothing by mouth after 4:30 AM.    Take these medicines the morning of surgery with A SIP OF WATER: Zoloft, Tamsulosin,Levothyroxine              Bring your mask and tubing to the hospital with you.      How to Manage Your Diabetes Before and After Surgery  Why is it important to control my blood sugar before and after surgery? . Improving blood sugar levels before and after surgery helps healing and can limit problems. . A way of improving blood sugar control is eating a healthy diet by: o  Eating less sugar and carbohydrates o  Increasing activity/exercise o  Talking with your doctor about reaching your blood sugar goals . High blood sugars (greater than 180 mg/dL) can raise your risk of infections and slow your recovery, so you will need to focus on controlling your diabetes during the weeks before surgery. . Make sure that the doctor  who takes care of your diabetes knows about your planned surgery including the date and location.  How do I manage my blood sugar before surgery? . Check your blood sugar at least 4 times a day, starting 2 days before surgery, to make sure that the level is not too high or low. o Check your blood sugar the morning of your surgery when you wake up and every 2 hours until you get to the Short Stay unit. . If your blood sugar is less than 70 mg/dL, you will need to treat for low blood sugar: o Do not take insulin. o Treat a low blood sugar (less than 70 mg/dL) with  cup of clear juice (cranberry or apple), 4 glucose tablets, OR glucose gel. o Recheck blood sugar in 15 minutes after treatment (to make sure it is greater than 70 mg/dL). If your blood sugar is not greater than 70 mg/dL on recheck, call [phone] for further instructions. . Report your blood sugar to the short stay nurse when you get to Short Stay.  . If you are admitted to the hospital after surgery: o Your blood sugar will be checked by the staff and you will probably be given insulin after surgery (instead of oral diabetes  medicines) to make sure you have good blood sugar levels. o The goal for blood sugar control after surgery is 80-180 mg/dL.   WHAT DO I DO ABOUT MY DIABETES MEDICATION?  Marland Kitchen Do not take oral diabetes medicines (pills) the morning of surgery.   . The day of surgery, do not take other diabetes injectables, including Byetta (exenatide), Bydureon (exenatide ER), Victoza (liraglutide), or Trulicity (dulaglutide).                      You may not have any metal on your body including              piercings  Do not wear jewelry,  lotions, powders or deodorant                          Men may shave face and neck.   Do not bring valuables to the hospital. [place].  Contacts, dentures or bridgework may not be worn into surgery.      Patients discharged the  day of surgery will not be allowed to drive home  . IF YOU ARE HAVING SURGERY AND GOING HOME THE SAME DAY, YOU MUST HAVE AN ADULT TO DRIVE YOU HOME AND BE WITH YOU FOR 24 HOURS.   YOU MAY GO HOME BY TAXI OR UBER OR ORTHERWISE, BUT AN ADULT MUST ACCOMPANY YOU HOME AND STAY WITH YOU FOR 24 HOURS.  Name and phone number of your driver:  Special Instructions: N/A              Please read over the following fact sheets you were given: _____________________________________________________________________             [facility]- Preparing for Total Shoulder Arthroplasty    Before surgery, you can play an important role. Because skin is not sterile, your skin needs to be as free of germs as possible. You can reduce the number of germs on your skin by using the following products. . Benzoyl Peroxide Gel o Reduces the number of germs present on the skin o Applied twice a day to shoulder area starting two days before surgery    ==================================================================  Please follow these instructions carefully:  BENZOYL PEROXIDE 5% GEL  Please do not use if you have an allergy to benzoyl peroxide.   If your skin becomes reddened/irritated stop using the benzoyl peroxide.  Starting two days before surgery, apply as follows: 1. Apply benzoyl peroxide in the morning and at night. 2.  Apply after taking a shower. If you are not taking a shower clean entire shoulder front, back, and side along with the armpit with a clean wet washcloth.  3. Place a quarter-sized dollop on your shoulder and rub in thoroughly, making sure to cover the front, back, and side of your shoulder, along with the armpit.   2 days before ____ AM   ____ PM              1 day before ____ AM   ____ PM                         4. Do this twice a day for two days.  5.  (Last application is the night before surgery, AFTER using the CHG soap as described below). 6.   7.  Do NOT apply benzoyl peroxide  gel on the day of surgery.    [facility] - Preparing for Surgery  Before surgery, you can play an important role.   Because skin is not sterile, your skin needs to be as free of germs as possible.   You can reduce the number of germs on your skin by washing with CHG (chlorahexidine gluconate) soap before surgery.   CHG is an antiseptic cleaner which kills germs and bonds with the skin to continue killing germs even after washing. Please DO NOT use if you have an allergy to CHG or antibacterial soaps.   If your skin becomes reddened/irritated stop using the CHG and inform your nurse when you arrive at Short Stay. .  You may shave your face/neck.  Please follow these instructions carefully:  1.  Shower with CHG Soap the night before surgery and the  morning of Surgery.  2.  If you choose to wash your hair, wash your hair first as usual with your  normal  shampoo.  3.  After you shampoo, rinse your hair and body thoroughly to remove the  shampoo.                                        4.  Use CHG as you would any other liquid soap.  You can apply chg directly  to the skin and wash                       Gently with a scrungie or clean washcloth.  5.  Apply the CHG Soap to your body ONLY FROM THE NECK DOWN.   Do not use on face/ open                           Wound or open sores. Avoid contact with eyes, ears mouth and genitals (private parts).                       Wash face,  Genitals (private parts) with your normal soap.             6.  Wash thoroughly, paying special attention to the area where your surgery  will be performed.  7.  Thoroughly rinse your body with warm water from the neck down.  8.  DO NOT shower/wash with your normal soap after using and rinsing off  the CHG Soap.             9.  Pat yourself dry with a clean towel.            10.  Wear clean pajamas.            11.  Place clean sheets on your bed the night of your first shower and do not  sleep with pets. Day of  Surgery : Do not apply any lotions/deodorants the morning of surgery.  Please wear clean clothes to the hospital/surgery center.  FAILURE TO FOLLOW THESE INSTRUCTIONS MAY RESULT IN THE CANCELLATION OF YOUR SURGERY PATIENT SIGNATURE_________________________________  NURSE SIGNATURE__________________________________  ________________________________________________________________________

## 2020-10-31 ENCOUNTER — Encounter (HOSPITAL_COMMUNITY)
Admission: RE | Admit: 2020-10-31 | Discharge: 2020-10-31 | Disposition: A | Discharge status: Home / Self Care | Payer: Medicare Other | Source: Ambulatory Visit | Attending: Orthopaedic Surgery | Admitting: Orthopaedic Surgery

## 2020-10-31 ENCOUNTER — Encounter (HOSPITAL_COMMUNITY): Payer: Self-pay

## 2020-10-31 ENCOUNTER — Other Ambulatory Visit: Payer: Self-pay

## 2020-10-31 DIAGNOSIS — Z01812 Encounter for preprocedural laboratory examination: Secondary | ICD-10-CM | POA: Diagnosis not present

## 2020-10-31 DIAGNOSIS — E119 Type 2 diabetes mellitus without complications: Secondary | ICD-10-CM | POA: Diagnosis not present

## 2020-10-31 LAB — TYPE AND SCREEN
ABO/RH(D): O POS
Antibody Screen: NEGATIVE

## 2020-10-31 LAB — CBC
HCT: 39.2 % (ref 39.0–52.0)
Hemoglobin: 13 g/dL (ref 13.0–17.0)
MCH: 33.1 pg (ref 26.0–34.0)
MCHC: 33.2 g/dL (ref 30.0–36.0)
MCV: 99.7 fL (ref 80.0–100.0)
Platelets: 168 10*3/uL (ref 150–400)
RBC: 3.93 MIL/uL — ABNORMAL LOW (ref 4.22–5.81)
RDW: 13.2 % (ref 11.5–15.5)
WBC: 8.8 10*3/uL (ref 4.0–10.5)
nRBC: 0 % (ref 0.0–0.2)

## 2020-10-31 LAB — BASIC METABOLIC PANEL
Anion gap: 6 (ref 5–15)
BUN: 24 mg/dL — ABNORMAL HIGH (ref 8–23)
CO2: 26 mmol/L (ref 22–32)
Calcium: 9.4 mg/dL (ref 8.9–10.3)
Chloride: 103 mmol/L (ref 98–111)
Creatinine, Ser: 0.96 mg/dL (ref 0.61–1.24)
GFR, Estimated: >60 mL/min (ref >60)
Glucose, Bld: 221 mg/dL — ABNORMAL HIGH (ref 70–99)
Potassium: 4.3 mmol/L (ref 3.5–5.1)
Sodium: 135 mmol/L (ref 135–145)

## 2020-10-31 LAB — HEMOGLOBIN A1C
Hgb A1c MFr Bld: 6.9 % — ABNORMAL HIGH (ref 4.8–5.6)
Mean Plasma Glucose: 151.33 mg/dL

## 2020-10-31 NOTE — Progress Notes (Signed)
COVID Vaccine Completed:Yes Date COVID Vaccine completed:02/14/20 COVID vaccine manufacturer: Pfizer     PCP - Dr. Edwin Dada Cardiologist - Dr. Britt Boozer  Chest x-ray - no EKG - 10/27/20-Epic Stress Test - no ECHO - 03/21/20 Cardiac Cath - no Pacemaker/ICD device last checked:NA  Sleep Study - yes CPAP - yes  Fasting Blood Sugar - 88-150 Checks Blood Sugar _QD____ times a day  Blood Thinner Instructions:NA Aspirin Instructions: Last Dose:  Anesthesia review:   Patient denies shortness of breath, fever, cough and chest pain at PAT appointment yes   Patient verbalized understanding of instructions that were given to them at the PAT appointment. Patient was also instructed that they will need to review over the PAT instructions again at home before surgery. Yes Pt denies SOB with any activity

## 2020-11-04 DIAGNOSIS — M1712 Unilateral primary osteoarthritis, left knee: Secondary | ICD-10-CM | POA: Diagnosis not present

## 2020-11-08 ENCOUNTER — Other Ambulatory Visit (HOSPITAL_COMMUNITY): Payer: Medicare Other

## 2020-11-12 ENCOUNTER — Ambulatory Visit (HOSPITAL_COMMUNITY): Admission: RE | Admit: 2020-11-12 | Payer: Medicare Other | Source: Ambulatory Visit | Admitting: Orthopaedic Surgery

## 2020-11-12 ENCOUNTER — Encounter (HOSPITAL_COMMUNITY): Admission: RE | Payer: Self-pay | Source: Ambulatory Visit

## 2020-11-12 SURGERY — ARTHROPLASTY, SHOULDER, TOTAL, REVERSE
Anesthesia: Choice | Site: Shoulder | Laterality: Left

## 2020-11-24 DIAGNOSIS — M25512 Pain in left shoulder: Secondary | ICD-10-CM | POA: Diagnosis not present

## 2020-12-02 DIAGNOSIS — M47816 Spondylosis without myelopathy or radiculopathy, lumbar region: Secondary | ICD-10-CM | POA: Diagnosis not present

## 2020-12-11 DIAGNOSIS — E119 Type 2 diabetes mellitus without complications: Secondary | ICD-10-CM | POA: Diagnosis not present

## 2020-12-11 DIAGNOSIS — E785 Hyperlipidemia, unspecified: Secondary | ICD-10-CM | POA: Diagnosis not present

## 2020-12-18 DIAGNOSIS — Z6830 Body mass index (BMI) 30.0-30.9, adult: Secondary | ICD-10-CM | POA: Diagnosis not present

## 2020-12-18 DIAGNOSIS — Z8709 Personal history of other diseases of the respiratory system: Secondary | ICD-10-CM | POA: Diagnosis not present

## 2020-12-18 DIAGNOSIS — E785 Hyperlipidemia, unspecified: Secondary | ICD-10-CM | POA: Diagnosis not present

## 2020-12-18 DIAGNOSIS — E119 Type 2 diabetes mellitus without complications: Secondary | ICD-10-CM | POA: Diagnosis not present

## 2021-01-13 DIAGNOSIS — H18592 Other hereditary corneal dystrophies, left eye: Secondary | ICD-10-CM | POA: Diagnosis not present

## 2021-01-13 DIAGNOSIS — H52223 Regular astigmatism, bilateral: Secondary | ICD-10-CM | POA: Diagnosis not present

## 2021-01-13 DIAGNOSIS — H5201 Hypermetropia, right eye: Secondary | ICD-10-CM | POA: Diagnosis not present

## 2021-01-13 DIAGNOSIS — H18591 Other hereditary corneal dystrophies, right eye: Secondary | ICD-10-CM | POA: Diagnosis not present

## 2021-01-28 DIAGNOSIS — E119 Type 2 diabetes mellitus without complications: Secondary | ICD-10-CM | POA: Diagnosis not present

## 2021-01-28 DIAGNOSIS — G4733 Obstructive sleep apnea (adult) (pediatric): Secondary | ICD-10-CM | POA: Diagnosis not present

## 2021-02-25 DIAGNOSIS — Z6829 Body mass index (BMI) 29.0-29.9, adult: Secondary | ICD-10-CM | POA: Diagnosis not present

## 2021-02-25 DIAGNOSIS — M47816 Spondylosis without myelopathy or radiculopathy, lumbar region: Secondary | ICD-10-CM | POA: Diagnosis not present

## 2021-02-25 DIAGNOSIS — G894 Chronic pain syndrome: Secondary | ICD-10-CM | POA: Diagnosis not present

## 2021-02-25 DIAGNOSIS — R030 Elevated blood-pressure reading, without diagnosis of hypertension: Secondary | ICD-10-CM | POA: Diagnosis not present

## 2021-02-25 DIAGNOSIS — M5416 Radiculopathy, lumbar region: Secondary | ICD-10-CM | POA: Diagnosis not present

## 2021-03-11 DIAGNOSIS — E039 Hypothyroidism, unspecified: Secondary | ICD-10-CM | POA: Diagnosis not present

## 2021-03-11 DIAGNOSIS — E1165 Type 2 diabetes mellitus with hyperglycemia: Secondary | ICD-10-CM | POA: Diagnosis not present

## 2021-03-11 DIAGNOSIS — F339 Major depressive disorder, recurrent, unspecified: Secondary | ICD-10-CM | POA: Diagnosis not present

## 2021-03-11 DIAGNOSIS — E7800 Pure hypercholesterolemia, unspecified: Secondary | ICD-10-CM | POA: Diagnosis not present

## 2021-03-16 DIAGNOSIS — E785 Hyperlipidemia, unspecified: Secondary | ICD-10-CM | POA: Diagnosis not present

## 2021-03-16 DIAGNOSIS — E119 Type 2 diabetes mellitus without complications: Secondary | ICD-10-CM | POA: Diagnosis not present

## 2021-03-17 DIAGNOSIS — M47816 Spondylosis without myelopathy or radiculopathy, lumbar region: Secondary | ICD-10-CM | POA: Diagnosis not present

## 2021-03-19 DIAGNOSIS — E785 Hyperlipidemia, unspecified: Secondary | ICD-10-CM | POA: Diagnosis not present

## 2021-03-19 DIAGNOSIS — Z6830 Body mass index (BMI) 30.0-30.9, adult: Secondary | ICD-10-CM | POA: Diagnosis not present

## 2021-03-19 DIAGNOSIS — Z8709 Personal history of other diseases of the respiratory system: Secondary | ICD-10-CM | POA: Diagnosis not present

## 2021-03-19 DIAGNOSIS — E119 Type 2 diabetes mellitus without complications: Secondary | ICD-10-CM | POA: Diagnosis not present

## 2021-03-19 DIAGNOSIS — M25512 Pain in left shoulder: Secondary | ICD-10-CM | POA: Diagnosis not present

## 2021-03-19 DIAGNOSIS — M25511 Pain in right shoulder: Secondary | ICD-10-CM | POA: Diagnosis not present

## 2021-04-15 DIAGNOSIS — G894 Chronic pain syndrome: Secondary | ICD-10-CM | POA: Diagnosis not present

## 2021-04-15 DIAGNOSIS — M47816 Spondylosis without myelopathy or radiculopathy, lumbar region: Secondary | ICD-10-CM | POA: Diagnosis not present

## 2021-04-28 DIAGNOSIS — E039 Hypothyroidism, unspecified: Secondary | ICD-10-CM | POA: Diagnosis not present

## 2021-04-28 DIAGNOSIS — F339 Major depressive disorder, recurrent, unspecified: Secondary | ICD-10-CM | POA: Diagnosis not present

## 2021-04-28 DIAGNOSIS — E7800 Pure hypercholesterolemia, unspecified: Secondary | ICD-10-CM | POA: Diagnosis not present

## 2021-04-28 DIAGNOSIS — M25511 Pain in right shoulder: Secondary | ICD-10-CM | POA: Diagnosis not present

## 2021-04-28 DIAGNOSIS — E1165 Type 2 diabetes mellitus with hyperglycemia: Secondary | ICD-10-CM | POA: Diagnosis not present

## 2021-05-04 DIAGNOSIS — M25511 Pain in right shoulder: Secondary | ICD-10-CM | POA: Diagnosis not present

## 2021-05-07 DIAGNOSIS — M25511 Pain in right shoulder: Secondary | ICD-10-CM | POA: Diagnosis not present

## 2021-05-21 DIAGNOSIS — D1801 Hemangioma of skin and subcutaneous tissue: Secondary | ICD-10-CM | POA: Diagnosis not present

## 2021-05-21 DIAGNOSIS — L810 Postinflammatory hyperpigmentation: Secondary | ICD-10-CM | POA: Diagnosis not present

## 2021-05-21 DIAGNOSIS — D2262 Melanocytic nevi of left upper limb, including shoulder: Secondary | ICD-10-CM | POA: Diagnosis not present

## 2021-05-21 DIAGNOSIS — D225 Melanocytic nevi of trunk: Secondary | ICD-10-CM | POA: Diagnosis not present

## 2021-05-21 DIAGNOSIS — L821 Other seborrheic keratosis: Secondary | ICD-10-CM | POA: Diagnosis not present

## 2021-05-21 DIAGNOSIS — L570 Actinic keratosis: Secondary | ICD-10-CM | POA: Diagnosis not present

## 2021-05-21 DIAGNOSIS — L918 Other hypertrophic disorders of the skin: Secondary | ICD-10-CM | POA: Diagnosis not present

## 2021-06-09 IMAGING — MR MR LUMBAR SPINE W/O CM
4 of 5 series · 22 of 48 positions shown · non-contrast
Comparison: Lumbar spine radiographs 05/08/2020, lumbar spine MRI
10/16/2019

CLINICAL DATA: Lumbar stenosis with neurogenic claudication.
Additional history provided by scanning technologist: Patient

EXAM:
MRI LUMBAR SPINE WITHOUT CONTRAST
TECHNIQUE: Multiplanar, multisequence MR imaging of the lumbar spine was
performed. No intravenous contrast was administered.

[Series 3: T2 post-contrast · sagittal · 4.0mm · 0.55mm/px · 6 of 15 slices shown]
[im 1/15]
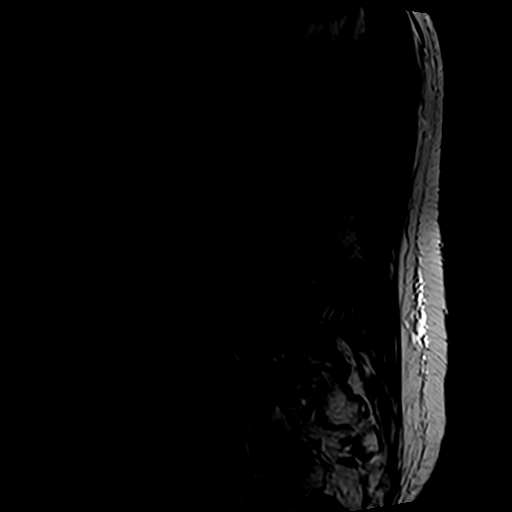
[im 3/15]
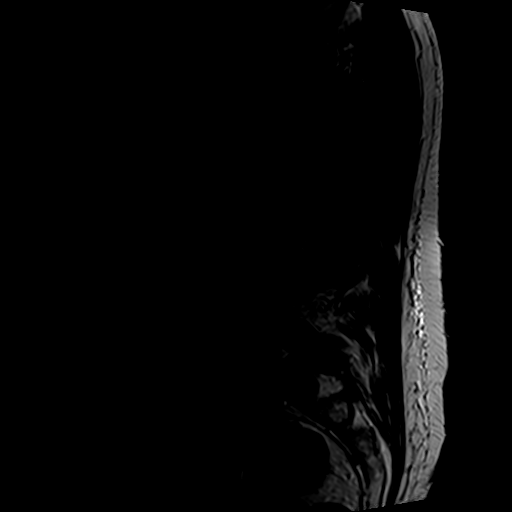
[im 6/15]
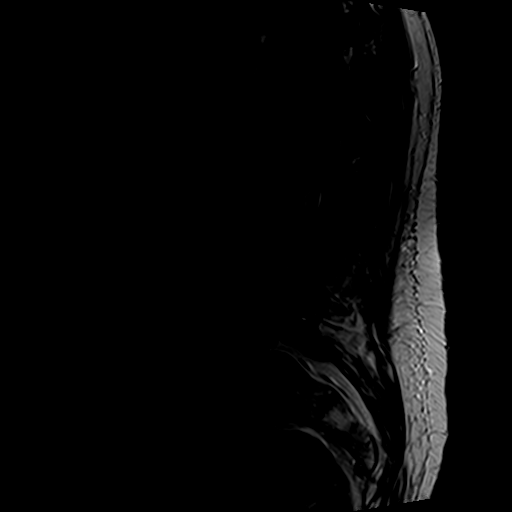
[im 9/15]
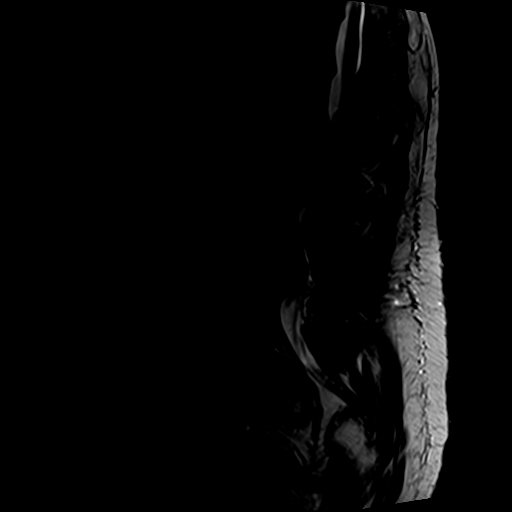
[im 12/15]
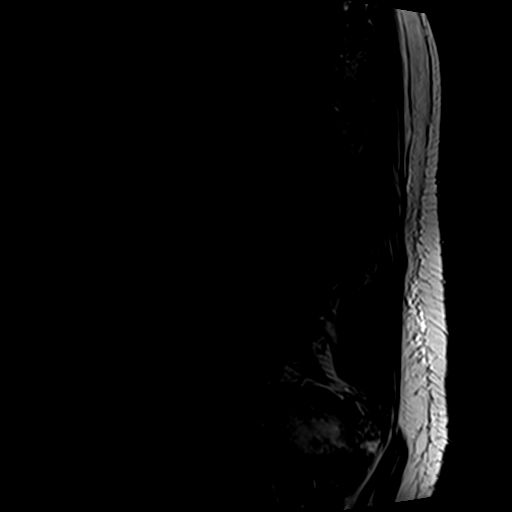
[im 15/15]
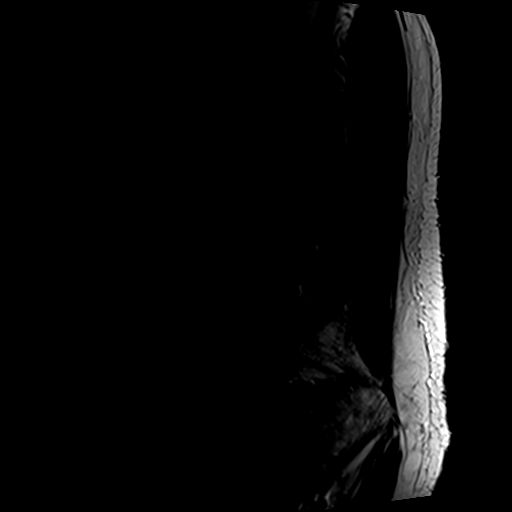

[Series 5: T1 · sagittal · 4.0mm · 0.55mm/px · 4 of 15 slices shown (1 of 2)]
[im 1/15]
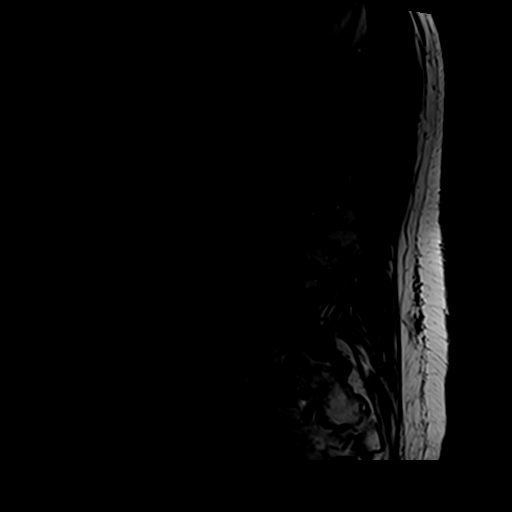
[im 3/15]
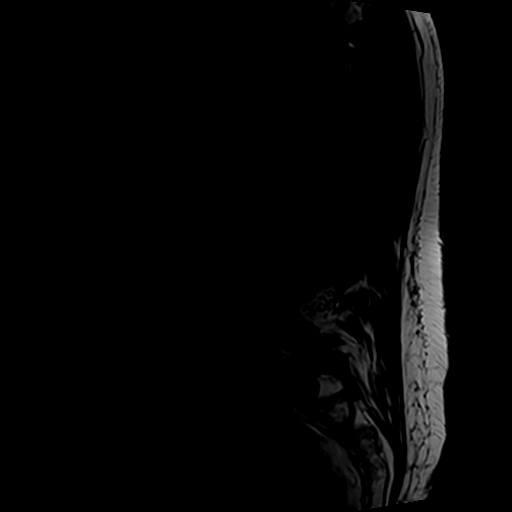
[im 9/15]
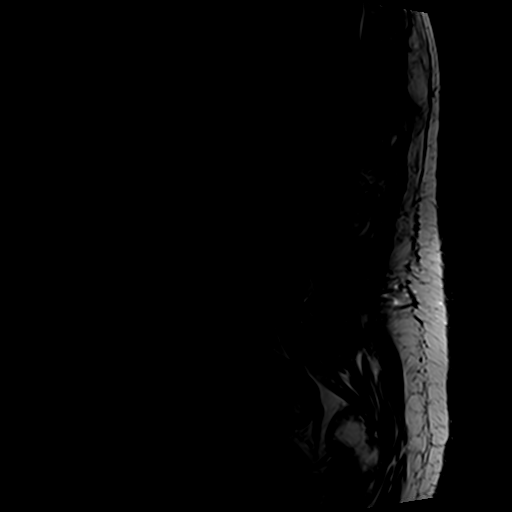
[im 15/15]
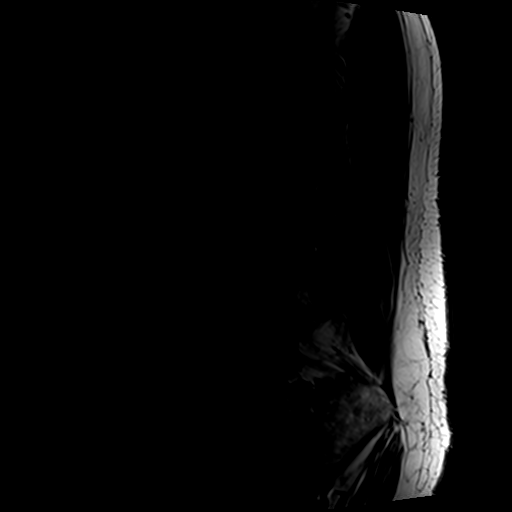

[Series 6: T1 · axial · 4.0mm · 0.35mm/px · z∈[-39,+113]mm · 3 of 35 slices shown (2 of 2)]
[im 5/35]
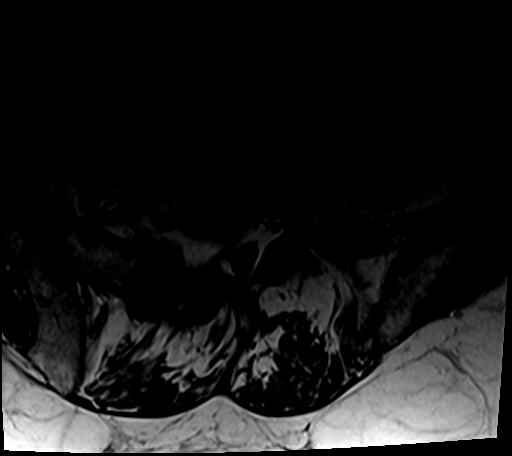
[im 18/35]
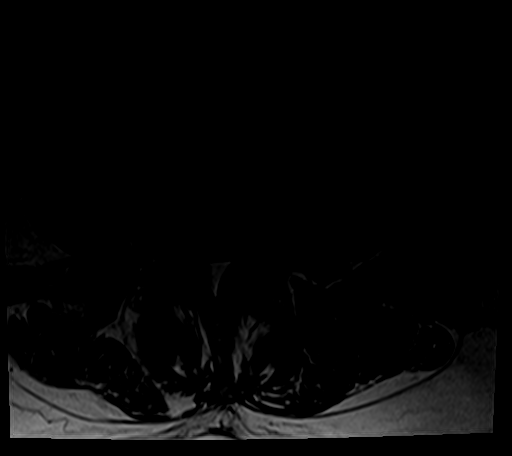
[im 30/35]
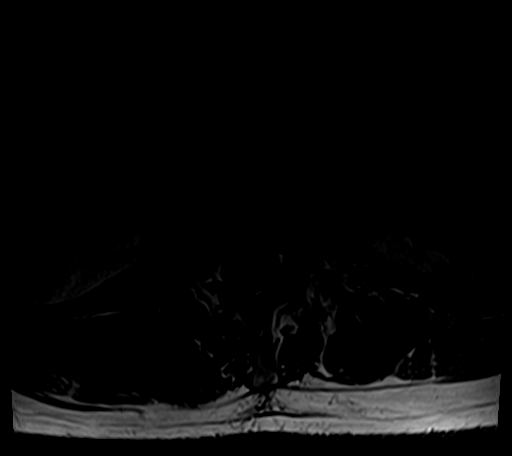

[Series 7: T2 · axial · 4.0mm · 0.70mm/px · z∈[-59,+139]mm · 9 of 35 slices shown]
[im 1/35]
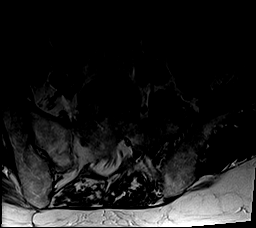
[im 5/35]
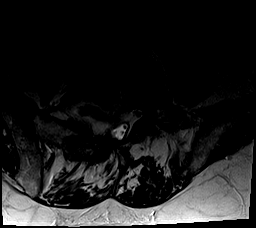
[im 10/35]
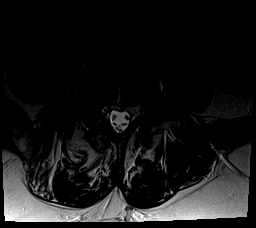
[im 15/35]
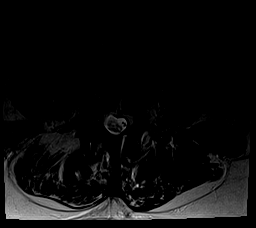
[im 18/35]
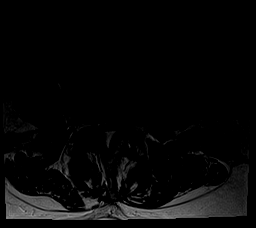
[im 20/35]
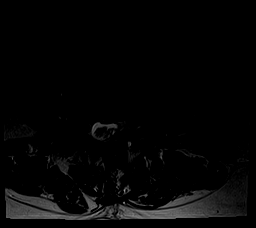
[im 25/35]
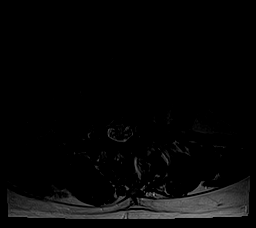
[im 30/35]
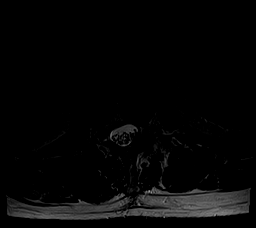
[im 35/35]
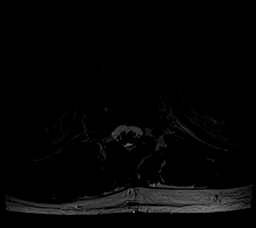

[22 of 48 positions shown; findings below may reference images not displayed]

FINDINGS: Segmentation: Transitional lumbosacral anatomy is again identified.
For the purposes of this dictation, spinal numbering will remain
consistent with that utilized on prior lumbar spine MRI 10/16/2019.
There is a relatively well-formed S1-S2 intervertebral disc with
left-sided assimilation joint at this level.

Alignment: Thoracolumbar dextrocurvature. Mild grade 1
retrolisthesis at L1-L2, L3-L4, L4-L5 and L5-S1.

Vertebrae: Vertebral body height is maintained. Multilevel
degenerative endplate irregularity. Prominent degenerative endplate
marrow edema at L1-L2. Patchy edema is also present within the
posterior elements at these levels and left L2 pedicle. L1 vertebral
body hemangioma.

Conus medullaris and cauda equina: Conus extends to the L1 level. No
signal abnormality within the visualized distal spinal cord.

Paraspinal and other soft tissues: No abnormality identified within
included portions of the abdomen/retroperitoneum. Postsurgical
changes to the lower lumbar dorsal paraspinal soft tissues. Atrophy
of the lumbar paraspinal musculature.

Disc levels:

Unless otherwise stated, the level by level findings below have not
significantly changed since prior MRI 10/16/2019.

Multilevel disc degeneration has not significantly changed. As
before, advanced disc degeneration is present at L1-L2, L2-L3 and
L4-L5.

T12-L1: Bulge with endplate spurring. Mild facet
arthrosis/ligamentum flavum hypertrophy. No significant canal or
foraminal stenosis

L1-L2: Grade 1 retrolisthesis. Disc bulge with endplate spurring.
Redemonstrated superimposed caudally migrated left
center/subarticular disc extrusion. Facet arthrosis/ligamentum
flavum hypertrophy. As before, the disc extrusion contributes to
moderate/severe left subarticular/lateral recess stenosis with
encroachment upon the descending left L2 nerve root. Mild right
subarticular narrowing. Central canal patent. Mild left neural
foraminal narrowing.

L2-L3: Grade 1 retrolisthesis. Disc bulge with endplate spurring.
Superimposed moderate-sized central disc extrusion with cranial
migration to the upper L2 vertebral body level. There is T1 and T2
hyperintensity within this disc extrusion which may reflect nonacute
blood products. Facet arthrosis/ligamentum flavum hypertrophy.
Prominent osteophytic ridging along the left lateral aspect of the
disc space. Progressive severe spinal canal stenosis with left
greater than right subarticular narrowing. Additionally, the
caudally migrated disc extrusion partially effaces the ventral
thecal sac at the L2 vertebral body level, contacting and displacing
descending nerve roots with overall mild/moderate spinal canal
stenosis at this level. Mild bilateral neural foraminal narrowing

L3-L4: Grade 1 retrolisthesis. Disc bulge. Moderate facet arthrosis
with ligamentum flavum hypertrophy. As before, there is
moderate/severe spinal canal stenosis. Severe left subarticular
narrowing with encroachment upon the descending left L4 nerve root.
Mild right neural foraminal narrowing.

L4-L5: Sequela of prior left-sided hemilaminectomy. Grade 1
retrolisthesis. Diffuse disc bulge with endplate spurring. Moderate
facet arthrosis with left-sided ligamentum flavum hypertrophy.
Interval increase in size of a right-sided ventrally projecting
synovial facet cyst now measuring 0.9 x 0.8 x 1.0 cm (series 7,
image 23) (series 4, image 8). As before, the right-sided synovial
facet cyst contributes to severe right subarticular stenosis,
encroaching upon the descending right-sided nerve roots, most
notably the descending right L5 nerve root. Progressive moderate
central canal stenosis. Unchanged mild left subarticular narrowing.
Bilateral neural foraminal narrowing (moderate right, mild left).

L5-S1: Disc bulge with endplate spurring. Advanced facet arthrosis
with ligamentum flavum hypertrophy. Mild right subarticular
narrowing without frank nerve root impingement. Central canal
patent. Bilateral neural foraminal narrowing (severe right, mild
left).

S1-S2: Right-sided facet arthropathy. No significant disc herniation
or stenosis.
IMPRESSION: Transitional lumbosacral anatomy with spinal numbering as described.

Advanced lumbar spondylosis has progressed as compared to the prior
MRI 10/16/2019. Findings are most notably as follows.

At L1-L2, a caudally migrated left center/subarticular disc
extrusion contributes to moderate/severe left subarticular/lateral
recess stenosis, encroaching upon the descending left L2 nerve root.
These findings are unchanged.

At L2-L3, there is progressive multi factor severe spinal canal
stenosis with left greater than right subarticular narrowing.
Additionally, a redemonstrated moderate-sized caudally migrated
central disc extrusion partially effaces the ventral thecal sac at
the L2 vertebral body level, contacting and displacing descending
nerve roots with overall mild/moderate spinal canal stenosis at this
level.

At L3-L4, multifactorial moderate/severe spinal canal stenosis.
Severe left subarticular narrowing with encroachment upon the
descending left L4 nerve root. These findings are unchanged.

At L4-L5, a progressive 1.0 cm ventrally projecting right-sided
synovial facet cyst contributes to progressive severe right
subarticular stenosis, encroaching upon multiple descending
right-sided nerve roots, most notably the descending right L5 nerve
root. The cyst also contributes to progressive moderate central
canal stenosis. Moderate right neural foraminal narrowing is
unchanged

At L5-S1, unchanged multifactorial severe right neural foraminal
narrowing.

## 2021-06-11 ENCOUNTER — Other Ambulatory Visit: Payer: Self-pay

## 2021-06-11 ENCOUNTER — Encounter (HOSPITAL_BASED_OUTPATIENT_CLINIC_OR_DEPARTMENT_OTHER): Payer: Self-pay | Admitting: Orthopaedic Surgery

## 2021-06-14 NOTE — H&P (Signed)
PREOPERATIVE H&P  Chief Complaint: right shoulder cartilage disorder, impingement syndrome,rotator cuff tear, bicipital tendinitis.  HPI: Trevor Grimes is a 70 y.o. male who is scheduled for, Procedure(s): SHOULDER ARTHROSCOPY ITH ROTATOR CUFF REPAIR AND SUBACROMIAL DECOMPRESSION BICEPS TENODESIS.   Patient has a past medical history significant for DM, hypothyroidism, sleep apnea.   He is suffering with right sided cuff tear symptoms.  He is worried that he has torn something.  He is unhappy with his shoulder.  He had a new acute injury when he was putting a piece of luggage into an overhead compartment on a train.  He immediately had pain and weakness in the shoulder.  It has been steady for the last few weeks and has been trying home exercises, as well as anti-inflammatories.    His symptoms are rated as moderate to severe, and have been worsening.  This is significantly impairing activities of daily living.    Please see clinic note for further details on this patient's care.    He has elected for surgical management.   Past Medical History:  Diagnosis Date   Anxiety    Arthritis    Chronic back pain    Depression    Diabetes mellitus without complication ([place])    History of kidney stones    Hypothyroidism    Sleep apnea    Has CPAP machine, does not wear every night   Thyroid disease    Past Surgical History:  Procedure Laterality Date   AMPUTATION Right 01/25/2013   Procedure: RIGHT 1ST RAY AMPUTATION ;  Surgeon: Wylene Simmer, MD;  Location: [facility];  Service: Orthopedics;  Laterality: Right;   BACK SURGERY     CERVICAL FUSION     CHEILECTOMY Left 05/06/2016   Procedure: LEFT HALLUX METATARSAL PHALANGEAL JOINT CHEILECTOMY AND CARTIVA  RESURFACING;  Surgeon: Wylene Simmer, MD;  Location: [facility];  Service: Orthopedics;  Laterality: Left;   I & D EXTREMITY Right 01/25/2013   Procedure: IRRIGATION AND DEBRIDEMENT Right Hallux;  Surgeon: Wylene Simmer, MD;   Location: [facility];  Service: Orthopedics;  Laterality: Right;   KNEE SURGERY     Social History   Socioeconomic History   Marital status: Married    Spouse name: Not on file   Number of children: Not on file   Years of education: Not on file   Highest education level: Not on file  Occupational History   Not on file  Tobacco Use   Smoking status: Never   Smokeless tobacco: Never  Vaping Use   Vaping Use: Never used  Substance and Sexual Activity   Alcohol use: Yes    Comment: "occas"   Drug use: No   Sexual activity: Not on file  Other Topics Concern   Not on file  Social History Narrative   Not on file   Social Determinants of Health   Financial Resource Strain: Not on file  Food Insecurity: Not on file  Transportation Needs: Not on file  Physical Activity: Not on file  Stress: Not on file  Social Connections: Not on file   Family History  Problem Relation Age of Onset   Liver disease Mother    Diabetes Father    No Known Allergies Prior to Admission medications   Medication Sig Start Date End Date Taking? Authorizing Provider  atorvastatin (LIPITOR) 20 MG tablet Take 20 mg by mouth daily.    Yes [provider]  celecoxib (CELEBREX) 100 MG capsule Take  100 mg by mouth 2 (two) times daily.   Yes [provider]  cholecalciferol (VITAMIN D3) 25 MCG (1000 UNIT) tablet Take 1,000 Units by mouth daily.   Yes [provider]  glipiZIDE (GLUCOTROL) 5 MG tablet Take 5 mg by mouth 2 (two) times daily before a meal.    Yes [provider]  HYDROcodone-acetaminophen (NORCO/VICODIN) 5-325 MG tablet Take 1 tablet by mouth every 6 (six) hours as needed for moderate pain.   Yes [provider]  levothyroxine (SYNTHROID) 50 MCG tablet Take 50 mcg by mouth daily before breakfast.    Yes [provider]  metFORMIN (GLUCOPHAGE) 1000 MG tablet Take 1,000 mg by mouth in the morning, at noon, in the evening, and at bedtime.    Yes  [provider]  sertraline (ZOLOFT) 50 MG tablet Take 50 mg by mouth daily.   Yes [provider]  ACCU-CHEK SMARTVIEW test strip  12/27/12   [provider]  cyclobenzaprine (FLEXERIL) 10 MG tablet Take 10 mg by mouth 3 (three) times daily as needed for muscle spasms.     [provider]  ondansetron (ZOFRAN ODT) 4 MG disintegrating tablet '4mg'$  ODT q4 hours prn nausea/vomit Patient taking differently: Take 4 mg by mouth every 4 (four) hours as needed for nausea or vomiting. '4mg'$  ODT q4 hours prn nausea/vomit 03/15/20   Deno Etienne, DO  ondansetron (ZOFRAN) 4 MG tablet Take 1 tablet (4 mg total) by mouth every 8 (eight) hours as needed for nausea or vomiting. 10/27/20   Caccavale, Sophia, PA-C  OPTIVE 0.5-0.9 % ophthalmic solution Place 1 drop into both eyes 2 (two) times daily.  01/31/20   [provider]    ROS: All other systems have been reviewed and were otherwise negative with the exception of those mentioned in the HPI and as above.  Physical Exam: General: Alert, no acute distress Cardiovascular: No pedal edema Respiratory: No cyanosis, no use of accessory musculature GI: No organomegaly, abdomen is soft and non-tender Skin: No lesions in the area of chief complaint Neurologic: Sensation intact distally Psychiatric: Patient is competent for consent with normal mood and affect Lymphatic: No axillary or cervical lymphadenopathy  MUSCULOSKELETAL:  Right side demonstrates active forward elevation to about 160-170, limited due to pain during his range of motion.  4/5 cuff strength in regards to supraspinatus.  Negative AC tenderness to palpation.  Positive O'Brien's.    Imaging: MRI demonstrates a full-thickness tear of the lateral supraspinatus with 2 cm retraction.  He has some bulky AC arthrosis and type 2 acromion with fluid around his biceps.    Assessment: right shoulder cartilage disorder, impingement syndrome,rotator cuff tear, bicipital  tendinitis.  Plan: Plan for Procedure(s): SHOULDER ARTHROSCOPY ITH ROTATOR CUFF REPAIR AND SUBACROMIAL DECOMPRESSION BICEPS TENODESIS  The patient would benefit from surgery as he has failed conservative measures for an extended period of time.  We discussed risks and benefits including retear and stiffness.  He understands and would like to proceed.  We will plan for subacromial decompression with rotator cuff repair and biceps tenodesis with extensive debridement  The risks benefits and alternatives were discussed with the patient including but not limited to the risks of nonoperative treatment, versus surgical intervention including infection, bleeding, nerve injury,  blood clots, cardiopulmonary complications, morbidity, mortality, among others, and they were willing to proceed.   The patient acknowledged the explanation, agreed to proceed with the plan and consent was signed.   Operative Plan: Right shoulder scope with  SAD, BT, RCR Discharge Medications: Tylenol, Celebrex, Gabapentin, Oxycodone, Zofran DVT Prophylaxis: None Physical Therapy: Outpatient Special Discharge needs: Sling. [place], PA-C  06/14/2021 2:14 PM

## 2021-06-16 ENCOUNTER — Encounter (HOSPITAL_BASED_OUTPATIENT_CLINIC_OR_DEPARTMENT_OTHER)
Admission: RE | Admit: 2021-06-16 | Discharge: 2021-06-16 | Disposition: A | Discharge status: Home / Self Care | Payer: Medicare Other | Source: Ambulatory Visit | Attending: Orthopaedic Surgery | Admitting: Orthopaedic Surgery

## 2021-06-16 DIAGNOSIS — S43431A Superior glenoid labrum lesion of right shoulder, initial encounter: Secondary | ICD-10-CM | POA: Diagnosis not present

## 2021-06-16 DIAGNOSIS — Z79899 Other long term (current) drug therapy: Secondary | ICD-10-CM | POA: Diagnosis not present

## 2021-06-16 DIAGNOSIS — E119 Type 2 diabetes mellitus without complications: Secondary | ICD-10-CM | POA: Diagnosis not present

## 2021-06-16 DIAGNOSIS — Y9389 Activity, other specified: Secondary | ICD-10-CM | POA: Diagnosis not present

## 2021-06-16 DIAGNOSIS — Z01812 Encounter for preprocedural laboratory examination: Secondary | ICD-10-CM | POA: Insufficient documentation

## 2021-06-16 DIAGNOSIS — M75121 Complete rotator cuff tear or rupture of right shoulder, not specified as traumatic: Secondary | ICD-10-CM | POA: Diagnosis not present

## 2021-06-16 DIAGNOSIS — Z7984 Long term (current) use of oral hypoglycemic drugs: Secondary | ICD-10-CM | POA: Diagnosis not present

## 2021-06-16 DIAGNOSIS — X500XXA Overexertion from strenuous movement or load, initial encounter: Secondary | ICD-10-CM | POA: Diagnosis not present

## 2021-06-16 DIAGNOSIS — Z79890 Hormone replacement therapy (postmenopausal): Secondary | ICD-10-CM | POA: Diagnosis not present

## 2021-06-16 DIAGNOSIS — Z833 Family history of diabetes mellitus: Secondary | ICD-10-CM | POA: Diagnosis not present

## 2021-06-16 DIAGNOSIS — E039 Hypothyroidism, unspecified: Secondary | ICD-10-CM | POA: Diagnosis not present

## 2021-06-16 DIAGNOSIS — Z791 Long term (current) use of non-steroidal anti-inflammatories (NSAID): Secondary | ICD-10-CM | POA: Diagnosis not present

## 2021-06-16 DIAGNOSIS — M7541 Impingement syndrome of right shoulder: Secondary | ICD-10-CM | POA: Diagnosis not present

## 2021-06-16 DIAGNOSIS — Z8379 Family history of other diseases of the digestive system: Secondary | ICD-10-CM | POA: Diagnosis not present

## 2021-06-16 DIAGNOSIS — M7521 Bicipital tendinitis, right shoulder: Secondary | ICD-10-CM | POA: Diagnosis not present

## 2021-06-16 LAB — BASIC METABOLIC PANEL
Anion gap: 6 (ref 5–15)
BUN: 21 mg/dL (ref 8–23)
CO2: 30 mmol/L (ref 22–32)
Calcium: 9.7 mg/dL (ref 8.9–10.3)
Chloride: 101 mmol/L (ref 98–111)
Creatinine, Ser: 0.91 mg/dL (ref 0.61–1.24)
GFR, Estimated: >60 mL/min (ref >60)
Glucose, Bld: 228 mg/dL — ABNORMAL HIGH (ref 70–99)
Potassium: 4.9 mmol/L (ref 3.5–5.1)
Sodium: 137 mmol/L (ref 135–145)

## 2021-06-16 NOTE — Progress Notes (Signed)

## 2021-06-17 DIAGNOSIS — E119 Type 2 diabetes mellitus without complications: Secondary | ICD-10-CM | POA: Diagnosis not present

## 2021-06-17 DIAGNOSIS — E785 Hyperlipidemia, unspecified: Secondary | ICD-10-CM | POA: Diagnosis not present

## 2021-06-17 NOTE — Discharge Instructions (Addendum)
Ophelia Charter MD, MPH Noemi Chapel, PA-C [facility] [address] [phone] (tel)   [phone] (fax)   POST-OPERATIVE INSTRUCTIONS - SHOULDER ARTHROSCOPY  WOUND CARE You may remove the Operative Dressing on Post-Op Day #3 (72hrs after surgery).   Alternatively if you would like you can leave dressing on until follow-up if within 7-8 days but keep it dry. Leave steri-strips in place until they fall off on their own, usually 2 weeks postop. There may be a small amount of fluid/bleeding leaking at the surgical site.  This is normal; the shoulder is filled with fluid during the procedure and can leak for 24-48hrs after surgery.  You may change/reinforce the bandage as needed.  Use the Cryocuff or Ice as often as possible for the first 7 days, then as needed for pain relief. Always keep a towel, ACE wrap or other barrier between the cooling unit and your skin.  You may shower on Post-Op Day #3. Gently pat the area dry. Do not soak the shoulder in water or submerge it. Keep dry incisions as dry as possible. Do not go swimming in the pool or ocean until 4 weeks after surgery or when otherwise instructed.    EXERCISES/BRACING Sling should be used at all times until follow-up.  You can remove sling for hygiene.    Please continue to ambulate and do not stay sitting or lying for too long. Perform foot and wrist pumps to assist in circulation.  POST-OP MEDICATIONS- Multimodal approach to pain control In general your pain will be controlled with a combination of substances.  Prescriptions unless otherwise discussed are electronically sent to your pharmacy.  This is a carefully made plan we use to minimize narcotic use.     Celebrex - Anti-inflammatory medication taken on a scheduled basis Acetaminophen - Non-narcotic pain medicine taken on a scheduled basis  Gabapentin - this is a non-narcotic medication to help with pain, take on a scheduled basis Oxycodone  - This is a strong narcotic, to be used only on an "as needed" basis for SEVERE pain. Zofran - take as needed for nausea   FOLLOW-UP If you develop a Fever (?101.5), Redness or Drainage from the surgical incision site, please call our office to arrange for an evaluation. Please call the office to schedule a follow-up appointment for your suture removal, 10-14 days post-operatively.    HELPFUL INFORMATION  If you had a block, it will wear off between 8-24 hrs postop typically.  This is period when your pain may go from nearly zero to the pain you would have had postop without the block.  This is an abrupt transition but nothing dangerous is happening.  You may take an extra dose of narcotic when this happens.  You may be more comfortable sleeping in a semi-seated position the first few nights following surgery.  Keep a pillow propped under the elbow and forearm for comfort.  If you have a recliner type of chair it might be beneficial.  If not that is fine too, but it would be helpful to sleep propped up with pillows behind your operated shoulder as well under your elbow and forearm.  This will reduce pulling on the suture lines.  When dressing, put your operative arm in the sleeve first.  When getting undressed, take your operative arm out last.  Loose fitting, button-down shirts are recommended.  Often in the first days after surgery you may be more comfortable keeping your operative arm under your  shirt and not through the sleeve.  You may return to work/school in the next couple of days when you feel up to it.  Desk work and typing in the sling is fine.  We suggest you use the pain medication the first night prior to going to bed, in order to ease any pain when the anesthesia wears off. You should avoid taking pain medications on an empty stomach as it will make you nauseous.  You should wean off your narcotic medicines as soon as you are able.  Most patients will be off or using minimal  narcotics before their first postop appointment.   Do not drink alcoholic beverages or take illicit drugs when taking pain medications.  It is against the law to drive while taking narcotics.  In some states it is against the law to drive while your arm is in a sling.   Pain medication may make you constipated.  Below are a few solutions to try in this order: Decrease the amount of pain medication if you aren't having pain. Drink lots of decaffeinated fluids. Drink prune juice and/or eat dried prunes  If the first 3 don't work start with additional solutions Take Colace - an over-the-counter stool softener Take Senokot - an over-the-counter laxative Take Miralax - a stronger over-the-counter laxative  For more information including helpful videos and documents visit our website:   [email or web address]    Post Anesthesia Home Care Instructions  Activity: Get plenty of rest for the remainder of the day. A responsible individual must stay with you for 24 hours following the procedure.  For the next 24 hours, DO NOT: -Drive a car -Paediatric nurse -Drink alcoholic beverages -Take any medication unless instructed by your physician -Make any legal decisions or sign important papers.  Meals: Start with liquid foods such as gelatin or soup. Progress to regular foods as tolerated. Avoid greasy, spicy, heavy foods. If nausea and/or vomiting occur, drink only clear liquids until the nausea and/or vomiting subsides. Call your physician if vomiting continues.  Special Instructions/Symptoms: Your throat may feel dry or sore from the anesthesia or the breathing tube placed in your throat during surgery. If this causes discomfort, gargle with warm salt water. The discomfort should disappear within 24 hours.  If you had a scopolamine patch placed behind your ear for the management of post- operative nausea and/or vomiting:  1. The medication in the patch is  effective for 72 hours, after which it should be removed.  Wrap patch in a tissue and discard in the trash. Wash hands thoroughly with soap and water. 2. You may remove the patch earlier than 72 hours if you experience unpleasant side effects which may include dry mouth, dizziness or visual disturbances. 3. Avoid touching the patch. Wash your hands with soap and water after contact with the patch.    Regional Anesthesia Blocks  1. Numbness or the inability to move the "blocked" extremity may last from 3-48 hours after placement. The length of time depends on the medication injected and your individual response to the medication. If the numbness is not going away after 48 hours, call your surgeon.  2. The extremity that is blocked will need to be protected until the numbness is gone and the  Strength has returned. Because you cannot feel it, you will need to take extra care to avoid injury. Because it may be weak, you may have difficulty moving it or using it. You may not know what position it is in without  looking at it while the block is in effect.  3. For blocks in the legs and feet, returning to weight bearing and walking needs to be done carefully. You will need to wait until the numbness is entirely gone and the strength has returned. You should be able to move your leg and foot normally before you try and bear weight or walk. You will need someone to be with you when you first try to ensure you do not fall and possibly risk injury.  4. Bruising and tenderness at the needle site are common side effects and will resolve in a few days.  5. Persistent numbness or new problems with movement should be communicated to the surgeon or the [facility] [phone] [place] [phone]). Information for Discharge Teaching: EXPAREL (bupivacaine liposome injectable suspension)   Your surgeon or anesthesiologist gave you EXPAREL(bupivacaine) to help control your pain after  surgery.  EXPAREL is a local anesthetic that provides pain relief by numbing the tissue around the surgical site. EXPAREL is designed to release pain medication over time and can control pain for up to 72 hours. Depending on how you respond to EXPAREL, you may require less pain medication during your recovery.  Possible side effects: Temporary loss of sensation or ability to move in the area where bupivacaine was injected. Nausea, vomiting, constipation Rarely, numbness and tingling in your mouth or lips, lightheadedness, or anxiety may occur. Call your doctor right away if you think you may be experiencing any of these sensations, or if you have other questions regarding possible side effects.  Follow all other discharge instructions given to you by your surgeon or nurse. Eat a healthy diet and drink plenty of water or other fluids.  If you return to the hospital for any reason within 96 hours following the administration of EXPAREL, it is important for health care providers to know that you have received this anesthetic. A teal colored band has been placed on your arm with the date, time and amount of EXPAREL you have received in order to alert and inform your health care providers. Please leave this armband in place for the full 96 hours following administration, and then you may remove the band.

## 2021-06-17 NOTE — Anesthesia Preprocedure Evaluation (Addendum)
Anesthesia Evaluation  Patient identified by MRN, date of birth, ID band Patient awake    Reviewed: Allergy & Precautions, NPO status , Patient's Chart, lab work & pertinent test results  History of Anesthesia Complications Negative for: history of anesthetic complications  Airway Mallampati: I  TM Distance: >3 FB Neck ROM: Full    Dental no notable dental hx. (+) Teeth Intact, Dental Advisory Given, Caps   Pulmonary sleep apnea and Continuous Positive Airway Pressure Ventilation ,    Pulmonary exam normal breath sounds clear to auscultation       Cardiovascular Exercise Tolerance: Good negative cardio ROS Normal cardiovascular exam Rhythm:Regular Rate:Normal     Neuro/Psych PSYCHIATRIC DISORDERS Anxiety Depression negative neurological ROS     GI/Hepatic negative GI ROS, Neg liver ROS,   Endo/Other  diabetes, Type 2, Oral Hypoglycemic AgentsHypothyroidism   Renal/GU negative Renal ROS     Musculoskeletal  (+) Arthritis , Osteoarthritis,    Abdominal   Peds  Hematology negative hematology ROS (+)   Anesthesia Other Findings   Reproductive/Obstetrics                            Anesthesia Physical  Anesthesia Plan  ASA: 3  Anesthesia Plan: General   Post-op Pain Management: GA combined w/ Regional for post-op pain   Induction: Intravenous  PONV Risk Score and Plan: 2 and Ondansetron and Midazolam  Airway Management Planned: Oral ETT and LMA  Additional Equipment: None  Intra-op Plan:   Post-operative Plan: Extubation in OR  Informed Consent: I have reviewed the patients History and Physical, chart, labs and discussed the procedure including the risks, benefits and alternatives for the proposed anesthesia with the patient or authorized representative who has indicated his/her understanding and acceptance.     Dental advisory given  Plan Discussed with: Anesthesiologist  and CRNA  Anesthesia Plan Comments: (Risks/benefits of regional block discussed with patient including risk of bleeding, infection, nerve damage, and possibility of failed block.  Also discussed backup plan of general anesthesia and associated risks.  Patient wishes to proceed.)       Anesthesia Quick Evaluation

## 2021-06-18 ENCOUNTER — Ambulatory Visit (HOSPITAL_BASED_OUTPATIENT_CLINIC_OR_DEPARTMENT_OTHER)
Admission: RE | Admit: 2021-06-18 | Discharge: 2021-06-18 | Disposition: A | Discharge status: Home / Self Care | Payer: Medicare Other | Attending: Orthopaedic Surgery | Admitting: Orthopaedic Surgery

## 2021-06-18 ENCOUNTER — Ambulatory Visit (HOSPITAL_BASED_OUTPATIENT_CLINIC_OR_DEPARTMENT_OTHER): Payer: Medicare Other | Admitting: Anesthesiology

## 2021-06-18 ENCOUNTER — Other Ambulatory Visit: Payer: Self-pay

## 2021-06-18 ENCOUNTER — Encounter (HOSPITAL_BASED_OUTPATIENT_CLINIC_OR_DEPARTMENT_OTHER): Payer: Self-pay | Admitting: Orthopaedic Surgery

## 2021-06-18 ENCOUNTER — Encounter (HOSPITAL_BASED_OUTPATIENT_CLINIC_OR_DEPARTMENT_OTHER)
Admission: RE | Disposition: A | Discharge status: Home / Self Care | Payer: Self-pay | Source: Home / Self Care | Attending: Orthopaedic Surgery

## 2021-06-18 DIAGNOSIS — E039 Hypothyroidism, unspecified: Secondary | ICD-10-CM | POA: Diagnosis not present

## 2021-06-18 DIAGNOSIS — M7521 Bicipital tendinitis, right shoulder: Secondary | ICD-10-CM | POA: Insufficient documentation

## 2021-06-18 DIAGNOSIS — S43431A Superior glenoid labrum lesion of right shoulder, initial encounter: Secondary | ICD-10-CM | POA: Insufficient documentation

## 2021-06-18 DIAGNOSIS — M75101 Unspecified rotator cuff tear or rupture of right shoulder, not specified as traumatic: Secondary | ICD-10-CM | POA: Diagnosis not present

## 2021-06-18 DIAGNOSIS — Y9389 Activity, other specified: Secondary | ICD-10-CM | POA: Insufficient documentation

## 2021-06-18 DIAGNOSIS — E119 Type 2 diabetes mellitus without complications: Secondary | ICD-10-CM | POA: Diagnosis not present

## 2021-06-18 DIAGNOSIS — X500XXA Overexertion from strenuous movement or load, initial encounter: Secondary | ICD-10-CM | POA: Insufficient documentation

## 2021-06-18 DIAGNOSIS — M24111 Other articular cartilage disorders, right shoulder: Secondary | ICD-10-CM | POA: Diagnosis not present

## 2021-06-18 DIAGNOSIS — M75121 Complete rotator cuff tear or rupture of right shoulder, not specified as traumatic: Secondary | ICD-10-CM | POA: Insufficient documentation

## 2021-06-18 DIAGNOSIS — M7541 Impingement syndrome of right shoulder: Secondary | ICD-10-CM | POA: Diagnosis not present

## 2021-06-18 DIAGNOSIS — Z791 Long term (current) use of non-steroidal anti-inflammatories (NSAID): Secondary | ICD-10-CM | POA: Diagnosis not present

## 2021-06-18 DIAGNOSIS — Z79899 Other long term (current) drug therapy: Secondary | ICD-10-CM | POA: Insufficient documentation

## 2021-06-18 DIAGNOSIS — Z833 Family history of diabetes mellitus: Secondary | ICD-10-CM | POA: Insufficient documentation

## 2021-06-18 DIAGNOSIS — Z7984 Long term (current) use of oral hypoglycemic drugs: Secondary | ICD-10-CM | POA: Diagnosis not present

## 2021-06-18 DIAGNOSIS — G8918 Other acute postprocedural pain: Secondary | ICD-10-CM | POA: Diagnosis not present

## 2021-06-18 DIAGNOSIS — Z8379 Family history of other diseases of the digestive system: Secondary | ICD-10-CM | POA: Insufficient documentation

## 2021-06-18 DIAGNOSIS — Z79890 Hormone replacement therapy (postmenopausal): Secondary | ICD-10-CM | POA: Diagnosis not present

## 2021-06-18 DIAGNOSIS — S46011A Strain of muscle(s) and tendon(s) of the rotator cuff of right shoulder, initial encounter: Secondary | ICD-10-CM | POA: Diagnosis not present

## 2021-06-18 HISTORY — PX: BICEPT TENODESIS: SHX5116

## 2021-06-18 HISTORY — DX: Personal history of urinary calculi: Z87.442

## 2021-06-18 HISTORY — PX: SHOULDER ARTHROSCOPY WITH ROTATOR CUFF REPAIR AND SUBACROMIAL DECOMPRESSION: SHX5686

## 2021-06-18 LAB — GLUCOSE, CAPILLARY
Glucose-Capillary: 137 mg/dL — ABNORMAL HIGH (ref 70–99)
Glucose-Capillary: 230 mg/dL — ABNORMAL HIGH (ref 70–99)

## 2021-06-18 SURGERY — SHOULDER ARTHROSCOPY WITH ROTATOR CUFF REPAIR AND SUBACROMIAL DECOMPRESSION
Anesthesia: General | Site: Shoulder | Laterality: Right

## 2021-06-18 MED ORDER — FENTANYL CITRATE (PF) 100 MCG/2ML IJ SOLN
INTRAMUSCULAR | Status: AC
  Filled 2021-06-18: qty 2

## 2021-06-18 MED ORDER — ONDANSETRON HCL 4 MG/2ML IJ SOLN
INTRAMUSCULAR | Status: DC | PRN
  Administered 2021-06-18: 4 mg via INTRAVENOUS

## 2021-06-18 MED ORDER — MEPERIDINE HCL 25 MG/ML IJ SOLN: 6.2500 mg | INTRAMUSCULAR | Status: DC | PRN

## 2021-06-18 MED ORDER — SODIUM CHLORIDE 0.9 % IR SOLN
Status: DC | PRN
  Administered 2021-06-18: 3000 mL

## 2021-06-18 MED ORDER — FENTANYL CITRATE (PF) 100 MCG/2ML IJ SOLN
INTRAMUSCULAR | Status: DC | PRN
  Administered 2021-06-18: 50 ug via INTRAVENOUS

## 2021-06-18 MED ORDER — ACETAMINOPHEN 500 MG PO TABS: 1000.0000 mg | ORAL_TABLET | Freq: Three times a day (TID) | ORAL | 0 refills | Status: AC

## 2021-06-18 MED ORDER — FENTANYL CITRATE (PF) 100 MCG/2ML IJ SOLN
100.0000 ug | Freq: Once | INTRAMUSCULAR | Status: AC
  Administered 2021-06-18: 100 ug via INTRAVENOUS

## 2021-06-18 MED ORDER — OXYCODONE HCL 5 MG PO TABS: 5.0000 mg | ORAL_TABLET | Freq: Once | ORAL | Status: DC | PRN

## 2021-06-18 MED ORDER — LIDOCAINE HCL (PF) 2 % IJ SOLN
INTRAMUSCULAR | Status: AC
  Filled 2021-06-18: qty 5

## 2021-06-18 MED ORDER — EPINEPHRINE PF 1 MG/ML IJ SOLN
INTRAMUSCULAR | Status: AC
  Filled 2021-06-18: qty 12

## 2021-06-18 MED ORDER — ACETAMINOPHEN 160 MG/5ML PO SOLN: 325.0000 mg | ORAL | Status: DC | PRN

## 2021-06-18 MED ORDER — LACTATED RINGERS IV SOLN: INTRAVENOUS | Status: DC

## 2021-06-18 MED ORDER — ONDANSETRON HCL 4 MG PO TABS: 4.0000 mg | ORAL_TABLET | Freq: Three times a day (TID) | ORAL | 0 refills | Status: AC | PRN

## 2021-06-18 MED ORDER — OXYCODONE HCL 5 MG PO TABS: ORAL_TABLET | ORAL | 0 refills | Status: AC

## 2021-06-18 MED ORDER — ROCURONIUM BROMIDE 100 MG/10ML IV SOLN
INTRAVENOUS | Status: DC | PRN
  Administered 2021-06-18: 50 mg via INTRAVENOUS

## 2021-06-18 MED ORDER — FENTANYL CITRATE (PF) 100 MCG/2ML IJ SOLN: 25.0000 ug | INTRAMUSCULAR | Status: DC | PRN

## 2021-06-18 MED ORDER — CEFAZOLIN SODIUM-DEXTROSE 2-4 GM/100ML-% IV SOLN
INTRAVENOUS | Status: AC
  Filled 2021-06-18: qty 100

## 2021-06-18 MED ORDER — EPHEDRINE SULFATE 50 MG/ML IJ SOLN
INTRAMUSCULAR | Status: DC | PRN
  Administered 2021-06-18: 15 mg via INTRAVENOUS

## 2021-06-18 MED ORDER — GABAPENTIN 100 MG PO CAPS: 100.0000 mg | ORAL_CAPSULE | Freq: Three times a day (TID) | ORAL | 0 refills | Status: AC

## 2021-06-18 MED ORDER — ONDANSETRON HCL 4 MG/2ML IJ SOLN: 4.0000 mg | Freq: Once | INTRAMUSCULAR | Status: DC | PRN

## 2021-06-18 MED ORDER — CEFAZOLIN SODIUM-DEXTROSE 2-4 GM/100ML-% IV SOLN
2.0000 g | INTRAVENOUS | Status: AC
  Administered 2021-06-18: 2 g via INTRAVENOUS

## 2021-06-18 MED ORDER — DEXAMETHASONE SODIUM PHOSPHATE 4 MG/ML IJ SOLN
INTRAMUSCULAR | Status: DC | PRN
  Administered 2021-06-18: 5 mg via INTRAVENOUS

## 2021-06-18 MED ORDER — ROCURONIUM BROMIDE 10 MG/ML (PF) SYRINGE
PREFILLED_SYRINGE | INTRAVENOUS | Status: AC
  Filled 2021-06-18: qty 10

## 2021-06-18 MED ORDER — MIDAZOLAM HCL 2 MG/2ML IJ SOLN
2.0000 mg | Freq: Once | INTRAMUSCULAR | Status: AC
  Administered 2021-06-18: 2 mg via INTRAVENOUS

## 2021-06-18 MED ORDER — SUGAMMADEX SODIUM 200 MG/2ML IV SOLN
INTRAVENOUS | Status: DC | PRN
  Administered 2021-06-18: 200 mg via INTRAVENOUS

## 2021-06-18 MED ORDER — PROPOFOL 10 MG/ML IV BOLUS
INTRAVENOUS | Status: DC | PRN
  Administered 2021-06-18: 150 mg via INTRAVENOUS

## 2021-06-18 MED ORDER — ONDANSETRON HCL 4 MG/2ML IJ SOLN
INTRAMUSCULAR | Status: AC
  Filled 2021-06-18: qty 2

## 2021-06-18 MED ORDER — PHENYLEPHRINE HCL-NACL 10-0.9 MG/250ML-% IV SOLN
INTRAVENOUS | Status: DC | PRN
  Administered 2021-06-18: 50 ug/min via INTRAVENOUS

## 2021-06-18 MED ORDER — ACETAMINOPHEN 325 MG PO TABS: 325.0000 mg | ORAL_TABLET | ORAL | Status: DC | PRN

## 2021-06-18 MED ORDER — PHENYLEPHRINE HCL (PRESSORS) 10 MG/ML IV SOLN
INTRAVENOUS | Status: AC
  Filled 2021-06-18: qty 1

## 2021-06-18 MED ORDER — OXYCODONE HCL 5 MG/5ML PO SOLN: 5.0000 mg | Freq: Once | ORAL | Status: DC | PRN

## 2021-06-18 MED ORDER — SODIUM CHLORIDE 0.9 % IR SOLN
Status: DC | PRN
  Administered 2021-06-18: 12000 mL

## 2021-06-18 MED ORDER — LIDOCAINE HCL (CARDIAC) PF 100 MG/5ML IV SOSY
PREFILLED_SYRINGE | INTRAVENOUS | Status: DC | PRN
  Administered 2021-06-18: 60 mg via INTRAVENOUS

## 2021-06-18 MED ORDER — EPHEDRINE 5 MG/ML INJ
INTRAVENOUS | Status: AC
  Filled 2021-06-18: qty 10

## 2021-06-18 MED ORDER — MIDAZOLAM HCL 2 MG/2ML IJ SOLN
INTRAMUSCULAR | Status: AC
  Filled 2021-06-18: qty 2

## 2021-06-18 MED ORDER — CELECOXIB 100 MG PO CAPS: 100.0000 mg | ORAL_CAPSULE | Freq: Two times a day (BID) | ORAL | 0 refills | Status: AC

## 2021-06-18 MED ORDER — BUPIVACAINE HCL (PF) 0.5 % IJ SOLN
INTRAMUSCULAR | Status: DC | PRN
  Administered 2021-06-18: 15 mL via PERINEURAL

## 2021-06-18 MED ORDER — BUPIVACAINE LIPOSOME 1.3 % IJ SUSP
INTRAMUSCULAR | Status: DC | PRN
  Administered 2021-06-18: 10 mL

## 2021-06-18 MED ORDER — PROPOFOL 10 MG/ML IV BOLUS
INTRAVENOUS | Status: AC
  Filled 2021-06-18: qty 20

## 2021-06-18 MED ORDER — DEXAMETHASONE SODIUM PHOSPHATE 10 MG/ML IJ SOLN
INTRAMUSCULAR | Status: AC
  Filled 2021-06-18: qty 1

## 2021-06-18 SURGICAL SUPPLY — 68 items
AID PSTN UNV HD RSTRNT DISP (MISCELLANEOUS) ×2
ANCH SUT 2.6 FBRSTK 1.7 (Anchor) ×2 IMPLANT
ANCH SUT 2.6 FBRTK 1.7 (Anchor) ×4 IMPLANT
ANCH SUT SWLK 19.1X4.75 (Anchor) ×6 IMPLANT
ANCHOR FIBERTAK RC 2.6 (BLUE) (Anchor) ×6 IMPLANT
ANCHOR SUT 1.8 FBRTK KNTLS 2SU (Anchor) ×6 IMPLANT
ANCHOR SUT BIO SW 4.75X19.1 (Anchor) ×9 IMPLANT
ANCHOR SUT FBRTK 2.6X1.7X2 (Anchor) ×3 IMPLANT
APL PRP STRL LF DISP 70% ISPRP (MISCELLANEOUS) ×2
BLADE EXCALIBUR 4.0X13 (MISCELLANEOUS) ×3 IMPLANT
BURR OVAL 8 FLU 4.0X13 (MISCELLANEOUS) ×3 IMPLANT
CANNULA 5.75X71 LONG (CANNULA) ×3 IMPLANT
CANNULA PASSPORT 5 (CANNULA) IMPLANT
CANNULA PASSPORT BUTTON 10-40 (CANNULA) ×3 IMPLANT
CANNULA TWIST IN 8.25X7CM (CANNULA) IMPLANT
CHLORAPREP W/TINT 26 (MISCELLANEOUS) ×3 IMPLANT
CLSR STERI-STRIP ANTIMIC 1/2X4 (GAUZE/BANDAGES/DRESSINGS) ×3 IMPLANT
COOLER ICEMAN CLASSIC (MISCELLANEOUS) IMPLANT
DRAPE IMP U-DRAPE 54X76 (DRAPES) ×3 IMPLANT
DRAPE INCISE IOBAN 66X45 STRL (DRAPES) IMPLANT
DRAPE SHOULDER BEACH CHAIR (DRAPES) ×3 IMPLANT
DRSG PAD ABDOMINAL 8X10 ST (GAUZE/BANDAGES/DRESSINGS) ×3 IMPLANT
DW OUTFLOW CASSETTE/TUBE SET (MISCELLANEOUS) ×3 IMPLANT
GAUZE SPONGE 4X4 12PLY STRL (GAUZE/BANDAGES/DRESSINGS) ×3 IMPLANT
GLOVE SRG 8 PF TXTR STRL LF DI (GLOVE) ×2 IMPLANT
GLOVE SURG ENC MOIS LTX SZ6.5 (GLOVE) ×6 IMPLANT
GLOVE SURG LTX SZ8 (GLOVE) ×3 IMPLANT
GLOVE SURG UNDER POLY LF SZ6.5 (GLOVE) ×3 IMPLANT
GLOVE SURG UNDER POLY LF SZ7 (GLOVE) ×6 IMPLANT
GLOVE SURG UNDER POLY LF SZ8 (GLOVE) ×3
GOWN STRL REUS W/ TWL LRG LVL3 (GOWN DISPOSABLE) ×4 IMPLANT
GOWN STRL REUS W/TWL LRG LVL3 (GOWN DISPOSABLE) ×6
GOWN STRL REUS W/TWL XL LVL3 (GOWN DISPOSABLE) ×3 IMPLANT
IV NS IRRIG 3000ML ARTHROMATIC (IV SOLUTION) ×15 IMPLANT
KIT POSITIONER SHLDR ASSISTARM (MISCELLANEOUS) ×3 IMPLANT
KIT STABILIZATION SHOULDER (MISCELLANEOUS) IMPLANT
KIT STR SPEAR 1.8 FBRTK DISP (KITS) ×3 IMPLANT
LASSO 90 CVE QUICKPAS (DISPOSABLE) IMPLANT
LASSO CRESCENT QUICKPASS (SUTURE) ×3 IMPLANT
MANIFOLD NEPTUNE II (INSTRUMENTS) ×3 IMPLANT
NDL SAFETY ECLIPSE 18X1.5 (NEEDLE) ×2 IMPLANT
NEEDLE FILTER BLUNT 18X 1/2SAF (NEEDLE) ×1
NEEDLE FILTER BLUNT 18X1 1/2 (NEEDLE) ×2 IMPLANT
NEEDLE HYPO 18GX1.5 SHARP (NEEDLE) ×3
NEEDLE SCORPION MULTI FIRE (NEEDLE) ×3 IMPLANT
PACK ARTHROSCOPY DSU (CUSTOM PROCEDURE TRAY) ×3 IMPLANT
PACK BASIN DAY SURGERY FS (CUSTOM PROCEDURE TRAY) ×3 IMPLANT
PAD COLD SHLDR WRAP-ON (PAD) IMPLANT
PAD ORTHO SHOULDER 7X19 LRG (SOFTGOODS) ×3 IMPLANT
PORT APPOLLO RF 90DEGREE MULTI (SURGICAL WAND) ×3 IMPLANT
RESTRAINT HEAD UNIVERSAL NS (MISCELLANEOUS) ×3 IMPLANT
SHEET MEDIUM DRAPE 40X70 STRL (DRAPES) ×3 IMPLANT
SLEEVE SCD COMPRESS KNEE MED (STOCKING) ×3 IMPLANT
SLING ARM FOAM STRAP LRG (SOFTGOODS) IMPLANT
SUT FIBERWIRE #2 38 T-5 BLUE (SUTURE)
SUT MNCRL AB 4-0 PS2 18 (SUTURE) ×3 IMPLANT
SUT PDS AB 1 CT  36 (SUTURE)
SUT PDS AB 1 CT 36 (SUTURE) IMPLANT
SUT TIGER TAPE 7 IN WHITE (SUTURE) IMPLANT
SUTURE FIBERWR #2 38 T-5 BLUE (SUTURE) IMPLANT
SUTURE TAPE TIGERLINK 1.3MM BL (SUTURE) ×2 IMPLANT
SUTURETAPE TIGERLINK 1.3MM BL (SUTURE) ×3
SYR 10ML LL (SYRINGE) ×3 IMPLANT
SYR 5ML LL (SYRINGE) IMPLANT
TAPE FIBER 2MM 7IN #2 BLUE (SUTURE) ×3 IMPLANT
TOWEL GREEN STERILE FF (TOWEL DISPOSABLE) ×6 IMPLANT
TUBE CONNECTING 20X1/4 (TUBING) ×3 IMPLANT
TUBING ARTHROSCOPY IRRIG 16FT (MISCELLANEOUS) ×3 IMPLANT

## 2021-06-18 NOTE — Progress Notes (Signed)
Assisted Dr. Ambrose Pancoast with right, ultrasound guided, interscalene  block. Side rails up, monitors on throughout procedure. See vital signs in flow sheet. Tolerated Procedure well.

## 2021-06-18 NOTE — Anesthesia Procedure Notes (Signed)
Anesthesia Regional Block: Interscalene brachial plexus block   Pre-Anesthetic Checklist: , timeout performed,  Correct Patient, Correct Site, Correct Laterality,  Correct Procedure, Correct Position, site marked,  Risks and benefits discussed,  Surgical consent,  Pre-op evaluation,  At surgeon's request and post-op pain management  Laterality: Right  Prep: chloraprep       Needles:  Injection technique: Single-shot  Needle Type: Echogenic Stimulator Needle     Needle Length: 5cm  Needle Gauge: 22     Additional Needles:   Procedures:, nerve stimulator,,, ultrasound used (permanent image in chart),,     Nerve Stimulator or Paresthesia:  Response: hand, 0.45 mA  Additional Responses:   Narrative:  Start time: 06/18/2021 6:55 AM End time: 06/18/2021 7:00 AM Injection made incrementally with aspirations every 5 mL.  Performed by: Personally  Anesthesiologist: Janeece Riggers, MD  Additional Notes: Functioning IV was confirmed and monitors were applied.  A 54m 22ga Arrow echogenic stimulator needle was used. Sterile prep and drape,hand hygiene and sterile gloves were used. Ultrasound guidance: relevant anatomy identified, needle position confirmed, local anesthetic spread visualized around nerve(s)., vascular puncture avoided.  Image printed for medical record. Negative aspiration and negative test dose prior to incremental administration of local anesthetic. The patient tolerated the procedure well.

## 2021-06-18 NOTE — Transfer of Care (Signed)
Immediate Anesthesia Transfer of Care Note  Patient: Trevor Grimes  Procedure(s) Performed: SHOULDER ARTHROSCOPY ITH ROTATOR CUFF REPAIR AND SUBACROMIAL DECOMPRESSION (Right: Shoulder) BICEPS TENODESIS (Right)  Patient Location: PACU  Anesthesia Type:General  Level of Consciousness: drowsy, patient cooperative and responds to stimulation  Airway & Oxygen Therapy: Patient Spontanous Breathing and Patient connected to face mask oxygen  Post-op Assessment: Report given to RN and Post -op Vital signs reviewed and stable  Post vital signs: Reviewed and stable  Last Vitals:  Vitals Value Taken Time  BP 133/73 06/18/21 0907  Temp    Pulse 65 06/18/21 0907  Resp 22 06/18/21 0907  SpO2 98 % 06/18/21 0907  Vitals shown include unvalidated device data.  Last Pain:  Vitals:   06/18/21 0637  TempSrc: Oral  PainSc: 0-No pain      Patients Stated Pain Goal: 3 ([id number])  Complications: No notable events documented.

## 2021-06-18 NOTE — Op Note (Signed)
Orthopaedic Surgery Operative Note (CSN: [id number])  Trevor Grimes  [birth year] Date of Surgery: 06/18/2021   Diagnoses:  Right shoulder impingement, cuff tear, biceps tendinitis  Procedure: Arthroscopic extensive debridement Arthroscopic subacromial decompression Arthroscopic rotator cuff repair Arthroscopic biceps tenodesis Arthroscopic subscapularis repair   Operative Finding Exam under anesthesia: Full motion no limitation Articular space: No loose bodies, capsule intact, significant anterior and superior labral fraying, thickened and widened [facility] there were no redness. Chondral surfaces:Intact, no sign of chondral degeneration on the glenoid or humeral head Biceps: Significant redness and a type II SLAP tear. Subscapularis: Upper 30% was torn and delaminated.  Repaired with a single anchor. Superior Cuff: Delaminated cuff tear with moderate at best tissue quality.  We able to perform a double row repair with 3 medial anchors and 2 lateral. Bursal side: As above.  Wear pattern noted on the CA ligament consistent with impingement.  Successful completion of the planned procedure.  Patient had moderate tissue quality and a delaminated large cuff tear involving the supra and infraspinatus.  This was a chronic tear.  If the patient fails this to be a candidate for reverse shoulder arthroplasty most likely as his subscapularis was involved and we do not think that a superior capsular reconstruction would be warranted.   Post-operative plan: The patient will be non-weightbearing in a sling for 6 weeks with therapy to start after 2 weeks.  The patient will be discharged home.  DVT prophylaxis not indicated in ambulatory upper extremity patient without known risk factors.   Pain control with PRN pain medication preferring oral medicines.  Follow up plan will be scheduled in approximately 7 days for incision check and XR.  Post-Op Diagnosis: Same Surgeons:Primary: Hiram Gash,  MD Assistants:Caroline McBane PA-C Location: [place] OR ROOM 6 Anesthesia: General with Exparel interscalene block Antibiotics: Ancef 2 g Tourniquet time: None Estimated Blood Loss: Minimal Complications: None Specimens: None Implants: Implant Name Type Inv. Item Serial No. Manufacturer Lot No. LRB No. Used Action  ANCHOR SUT BIO SW 4.75X19.1 - [id number] Anchor ANCHOR SUT BIO SW 4.75X19.1  [place] [id number] Right 1 Implanted  ANCHOR SUT BIO SW 4.75X19.1 - [id number] Anchor ANCHOR SUT BIO SW 4.75X19.1  [employer] [id number] Right 1 Implanted  ANCHOR SUT FBRTK 2.6X1.7X2 - [id number] Anchor ANCHOR SUT FBRTK 2.6X1.7X2  [employer] [id number] Right 1 Implanted  ANCHOR FIBERTAK RC 2.6 [facility]) - [id number] Anchor ANCHOR FIBERTAK RC 2.6 [facility])  [employer] [place] [id number] Right 1 Implanted  ANCHOR FIBERTAK RC 2.6 [facility]) - [id number] Anchor ANCHOR FIBERTAK RC 2.6 [facility])  [employer] [id number] Right 1 Implanted  ANCHOR SUT BIO SW 4.75X19.1 - [id number] Anchor ANCHOR SUT BIO SW 4.75X19.1  [employer] [id number] Right 1 Implanted  ANCHOR SUT 1.8 FBRTK KNTLS 2SU - [id number] Anchor ANCHOR SUT 1.8 FBRTK KNTLS 2SU  [employer] [id number] Right 1 Implanted  ANCHOR SUT 1.8 FBRTK KNTLS 2SU - [id number] Anchor ANCHOR SUT 1.8 FBRTK [employer] [id number] Right 1 Implanted    Indications for Surgery:   Trevor Grimes is a 70 y.o. male with continued shoulder pain refractory to nonoperative measures for extended period of time.  MRI demonstrated a full-thickness supraspinatus tear that was relatively remote in nature.  The risks and benefits were explained at length including but not limited to continued pain, cuff failure, biceps tenodesis failure, stiffness, need for further surgery and infection.   Procedure:   Patient was correctly identified in the preoperative holding area and operative site  marked.  Patient brought to OR and positioned beachchair on an [place] table ensuring that all bony prominences were padded and  the head was in an appropriate location.  Anesthesia was induced and the operative shoulder was prepped and draped in the usual sterile fashion.  Timeout was called preincision.  A standard posterior viewing portal was made after localizing the portal with a spinal needle.  An anterior accessory portal was also made.  After clearing the articular space the camera was positioned in the subacromial space.  Findings above.    Extensive debridement was performed of the anterior interval tissue, labral fraying and the bursa.  Subacromial decompression: We made a lateral portal with spinal needle guidance. We then proceeded to debride bursal tissue extensively with a shaver and arthrocare device. At that point we continued to identify the borders of the acromion and identify the spur. We then carefully preserved the deltoid fascia and used a burr to convert the acromion to a Type 1 flat acromion without issue.  Biceps tenodesis: We marked the tendon and then performed a tenotomy and debridement of the stump in the articular space. We then identified the biceps tendon in its groove suprapec with the arthroscope in the lateral portal taking care to move from lateral to medial to avoid injury to the subscapularis. At that point we unroofed the tendon itself and mobilized it. An accessory anterior portal was made in line with the tendon and we grasped it from the anterior superior portal and worked from the accessory anterior portal. Two Fibertak 1.18m knotless anchors were placed in the groove and the tendon was secured in a luggage loop style fashion with a pass of the limb of suture through the tendon using a scorpion device to avoid pull-through.  Repair was completed with good tension on the tendon.  Residual stump of the tendon was removed after being resected with a RF ablator.   Subscapularis Repair: We identified a subscapularis tear that involved upper 30%.  Using a grasping device were able to  demonstrate that the tendon could be reapproximated to the lesser tuberosity.  We felt that it was repairable.  We then used a RF ablator to open the rotator interval and released the MGHL to allow further translation of the subscapularis.  This point we cleared both anterior and posterior to the tendon taking care to avoid inferior migration to avoid the neurovascular structures as well as the muscular cutaneous nerve.  We then used a lasso to pass a fiber tape in a mattress fashion through the subscapularis and based 4.75 swivel lock was used to repair back to the lesser tuberosity after prepping the tuberosity extensively.  We had good approximation of the tendon and a recreation of the rolled border.  Arthroscopic rotator cuff repair: Once the above is complete we used a shaver as well as a bur to prepare the tuberosity and clear soft tissue so that there was bony bleeding and appropriate bloody flecks for healing.  We then placed 3 Medial Row 2.6 mm fiber tack anchors with knotless mechanisms and tape.  We used a scorpion as well as a link suture to pass all of the sutures from each anchor through the cuff.  There was delamination of the tissue and were able to pass the sutures through each delaminated layer to restore the anatomy of each layer.  We then were able to use the knotless mechanism sutures to perform a double medial row tiedown compressing the medial row without  overtensioning.  Once this was complete we cut the switch sutures and performed a speed bridge type repair pulling the tapes over to 2 lateral row 4.75 mm bio composite swivel locks.  This provided compression of the cuff to the tuberosity.   The incisions were closed with absorbable monocryl and steri strips.  A sterile dressing was placed along with a sling. The patient was awoken from general anesthesia and taken to the PACU in stable condition without complication.   Noemi Chapel, PA-C, present and scrubbed throughout the  case, critical for completion in a timely fashion, and for retraction, instrumentation, closure.

## 2021-06-18 NOTE — Interval H&P Note (Signed)
All questions answered

## 2021-06-18 NOTE — Anesthesia Postprocedure Evaluation (Signed)
Anesthesia Post Note  Patient: MYLEN HAMMOCK  Procedure(s) Performed: SHOULDER ARTHROSCOPY ITH ROTATOR CUFF REPAIR AND SUBACROMIAL DECOMPRESSION (Right: Shoulder) BICEPS TENODESIS (Right)     Patient location during evaluation: PACU Anesthesia Type: General Level of consciousness: awake and alert Pain management: pain level controlled Vital Signs Assessment: post-procedure vital signs reviewed and stable Respiratory status: spontaneous breathing, nonlabored ventilation, respiratory function stable and patient connected to nasal cannula oxygen Cardiovascular status: blood pressure returned to baseline and stable Postop Assessment: no apparent nausea or vomiting Anesthetic complications: no   No notable events documented.  Last Vitals:  Vitals:   06/18/21 0930 06/18/21 0945  BP: 130/71 131/71  Pulse: 63 67  Resp: 14 16  Temp:  36.5 C  SpO2: 95% 93%    Last Pain:  Vitals:   06/18/21 0945  TempSrc:   PainSc: 0-No pain                 Alisha Bacus

## 2021-06-18 NOTE — Anesthesia Procedure Notes (Signed)
Procedure Name: Intubation Date/Time: 06/18/2021 7:30 AM Performed by: Glory Buff, CRNA Pre-anesthesia Checklist: Patient identified, Emergency Drugs available, Suction available and Patient being monitored Patient Re-evaluated:Patient Re-evaluated prior to induction Oxygen Delivery Method: Circle system utilized Preoxygenation: Pre-oxygenation with 100% oxygen Induction Type: IV induction Ventilation: Mask ventilation without difficulty Laryngoscope Size: Miller and 3 Grade View: Grade I Tube type: Oral Tube size: 7.5 mm Number of attempts: 1 Airway Equipment and Method: Stylet and Oral airway Placement Confirmation: ETT inserted through vocal cords under direct vision, positive ETCO2 and breath sounds checked- equal and bilateral Secured at: 22 cm Tube secured with: Tape Dental Injury: Teeth and Oropharynx as per pre-operative assessment

## 2021-06-19 ENCOUNTER — Encounter (HOSPITAL_BASED_OUTPATIENT_CLINIC_OR_DEPARTMENT_OTHER): Payer: Self-pay | Admitting: Orthopaedic Surgery

## 2021-06-26 DIAGNOSIS — M24111 Other articular cartilage disorders, right shoulder: Secondary | ICD-10-CM | POA: Diagnosis not present

## 2021-06-29 DIAGNOSIS — Z8709 Personal history of other diseases of the respiratory system: Secondary | ICD-10-CM | POA: Diagnosis not present

## 2021-06-29 DIAGNOSIS — E785 Hyperlipidemia, unspecified: Secondary | ICD-10-CM | POA: Diagnosis not present

## 2021-06-29 DIAGNOSIS — Z6829 Body mass index (BMI) 29.0-29.9, adult: Secondary | ICD-10-CM | POA: Diagnosis not present

## 2021-06-29 DIAGNOSIS — E119 Type 2 diabetes mellitus without complications: Secondary | ICD-10-CM | POA: Diagnosis not present

## 2021-07-02 DIAGNOSIS — C4442 Squamous cell carcinoma of skin of scalp and neck: Secondary | ICD-10-CM | POA: Diagnosis not present

## 2021-07-02 DIAGNOSIS — R531 Weakness: Secondary | ICD-10-CM | POA: Diagnosis not present

## 2021-07-02 DIAGNOSIS — M25611 Stiffness of right shoulder, not elsewhere classified: Secondary | ICD-10-CM | POA: Diagnosis not present

## 2021-07-02 DIAGNOSIS — D485 Neoplasm of uncertain behavior of skin: Secondary | ICD-10-CM | POA: Diagnosis not present

## 2021-07-02 DIAGNOSIS — D0462 Carcinoma in situ of skin of left upper limb, including shoulder: Secondary | ICD-10-CM | POA: Diagnosis not present

## 2021-07-06 DIAGNOSIS — R531 Weakness: Secondary | ICD-10-CM | POA: Diagnosis not present

## 2021-07-06 DIAGNOSIS — M25611 Stiffness of right shoulder, not elsewhere classified: Secondary | ICD-10-CM | POA: Diagnosis not present

## 2021-07-08 DIAGNOSIS — M25611 Stiffness of right shoulder, not elsewhere classified: Secondary | ICD-10-CM | POA: Diagnosis not present

## 2021-07-08 DIAGNOSIS — R531 Weakness: Secondary | ICD-10-CM | POA: Diagnosis not present

## 2021-07-13 DIAGNOSIS — R531 Weakness: Secondary | ICD-10-CM | POA: Diagnosis not present

## 2021-07-13 DIAGNOSIS — M25611 Stiffness of right shoulder, not elsewhere classified: Secondary | ICD-10-CM | POA: Diagnosis not present

## 2021-07-15 DIAGNOSIS — M25611 Stiffness of right shoulder, not elsewhere classified: Secondary | ICD-10-CM | POA: Diagnosis not present

## 2021-07-15 DIAGNOSIS — R531 Weakness: Secondary | ICD-10-CM | POA: Diagnosis not present

## 2021-07-20 DIAGNOSIS — R531 Weakness: Secondary | ICD-10-CM | POA: Diagnosis not present

## 2021-07-20 DIAGNOSIS — M25611 Stiffness of right shoulder, not elsewhere classified: Secondary | ICD-10-CM | POA: Diagnosis not present

## 2021-07-22 DIAGNOSIS — R531 Weakness: Secondary | ICD-10-CM | POA: Diagnosis not present

## 2021-07-22 DIAGNOSIS — M25611 Stiffness of right shoulder, not elsewhere classified: Secondary | ICD-10-CM | POA: Diagnosis not present

## 2021-07-28 DIAGNOSIS — G894 Chronic pain syndrome: Secondary | ICD-10-CM | POA: Diagnosis not present

## 2021-07-28 DIAGNOSIS — M47816 Spondylosis without myelopathy or radiculopathy, lumbar region: Secondary | ICD-10-CM | POA: Diagnosis not present

## 2021-07-28 DIAGNOSIS — M25611 Stiffness of right shoulder, not elsewhere classified: Secondary | ICD-10-CM | POA: Diagnosis not present

## 2021-07-28 DIAGNOSIS — R531 Weakness: Secondary | ICD-10-CM | POA: Diagnosis not present

## 2021-08-03 DIAGNOSIS — R531 Weakness: Secondary | ICD-10-CM | POA: Diagnosis not present

## 2021-08-03 DIAGNOSIS — M25611 Stiffness of right shoulder, not elsewhere classified: Secondary | ICD-10-CM | POA: Diagnosis not present

## 2021-08-04 DIAGNOSIS — M24111 Other articular cartilage disorders, right shoulder: Secondary | ICD-10-CM | POA: Diagnosis not present

## 2021-08-06 DIAGNOSIS — R531 Weakness: Secondary | ICD-10-CM | POA: Diagnosis not present

## 2021-08-06 DIAGNOSIS — M25611 Stiffness of right shoulder, not elsewhere classified: Secondary | ICD-10-CM | POA: Diagnosis not present

## 2021-08-13 DIAGNOSIS — R531 Weakness: Secondary | ICD-10-CM | POA: Diagnosis not present

## 2021-08-13 DIAGNOSIS — M25611 Stiffness of right shoulder, not elsewhere classified: Secondary | ICD-10-CM | POA: Diagnosis not present

## 2021-08-17 DIAGNOSIS — M25611 Stiffness of right shoulder, not elsewhere classified: Secondary | ICD-10-CM | POA: Diagnosis not present

## 2021-08-17 DIAGNOSIS — R531 Weakness: Secondary | ICD-10-CM | POA: Diagnosis not present

## 2021-08-19 DIAGNOSIS — M25611 Stiffness of right shoulder, not elsewhere classified: Secondary | ICD-10-CM | POA: Diagnosis not present

## 2021-08-19 DIAGNOSIS — R531 Weakness: Secondary | ICD-10-CM | POA: Diagnosis not present

## 2021-08-24 DIAGNOSIS — R531 Weakness: Secondary | ICD-10-CM | POA: Diagnosis not present

## 2021-08-24 DIAGNOSIS — M25611 Stiffness of right shoulder, not elsewhere classified: Secondary | ICD-10-CM | POA: Diagnosis not present

## 2021-08-24 DIAGNOSIS — M1712 Unilateral primary osteoarthritis, left knee: Secondary | ICD-10-CM | POA: Diagnosis not present

## 2021-08-26 DIAGNOSIS — M25611 Stiffness of right shoulder, not elsewhere classified: Secondary | ICD-10-CM | POA: Diagnosis not present

## 2021-08-26 DIAGNOSIS — R531 Weakness: Secondary | ICD-10-CM | POA: Diagnosis not present

## 2021-08-31 DIAGNOSIS — R531 Weakness: Secondary | ICD-10-CM | POA: Diagnosis not present

## 2021-08-31 DIAGNOSIS — M25611 Stiffness of right shoulder, not elsewhere classified: Secondary | ICD-10-CM | POA: Diagnosis not present

## 2021-09-02 DIAGNOSIS — E7800 Pure hypercholesterolemia, unspecified: Secondary | ICD-10-CM | POA: Diagnosis not present

## 2021-09-02 DIAGNOSIS — M25611 Stiffness of right shoulder, not elsewhere classified: Secondary | ICD-10-CM | POA: Diagnosis not present

## 2021-09-02 DIAGNOSIS — F339 Major depressive disorder, recurrent, unspecified: Secondary | ICD-10-CM | POA: Diagnosis not present

## 2021-09-02 DIAGNOSIS — R531 Weakness: Secondary | ICD-10-CM | POA: Diagnosis not present

## 2021-09-02 DIAGNOSIS — E1165 Type 2 diabetes mellitus with hyperglycemia: Secondary | ICD-10-CM | POA: Diagnosis not present

## 2021-09-02 DIAGNOSIS — E039 Hypothyroidism, unspecified: Secondary | ICD-10-CM | POA: Diagnosis not present

## 2021-09-07 DIAGNOSIS — M25611 Stiffness of right shoulder, not elsewhere classified: Secondary | ICD-10-CM | POA: Diagnosis not present

## 2021-09-07 DIAGNOSIS — R531 Weakness: Secondary | ICD-10-CM | POA: Diagnosis not present

## 2021-09-16 DIAGNOSIS — R531 Weakness: Secondary | ICD-10-CM | POA: Diagnosis not present

## 2021-09-16 DIAGNOSIS — M25611 Stiffness of right shoulder, not elsewhere classified: Secondary | ICD-10-CM | POA: Diagnosis not present

## 2021-09-21 DIAGNOSIS — M25611 Stiffness of right shoulder, not elsewhere classified: Secondary | ICD-10-CM | POA: Diagnosis not present

## 2021-09-21 DIAGNOSIS — R531 Weakness: Secondary | ICD-10-CM | POA: Diagnosis not present

## 2021-09-23 DIAGNOSIS — M25611 Stiffness of right shoulder, not elsewhere classified: Secondary | ICD-10-CM | POA: Diagnosis not present

## 2021-09-23 DIAGNOSIS — R531 Weakness: Secondary | ICD-10-CM | POA: Diagnosis not present

## 2021-09-28 DIAGNOSIS — R531 Weakness: Secondary | ICD-10-CM | POA: Diagnosis not present

## 2021-09-28 DIAGNOSIS — M25611 Stiffness of right shoulder, not elsewhere classified: Secondary | ICD-10-CM | POA: Diagnosis not present

## 2021-09-29 DIAGNOSIS — Z8709 Personal history of other diseases of the respiratory system: Secondary | ICD-10-CM | POA: Diagnosis not present

## 2021-09-29 DIAGNOSIS — E039 Hypothyroidism, unspecified: Secondary | ICD-10-CM | POA: Diagnosis not present

## 2021-09-29 DIAGNOSIS — Z6829 Body mass index (BMI) 29.0-29.9, adult: Secondary | ICD-10-CM | POA: Diagnosis not present

## 2021-09-29 DIAGNOSIS — Z8639 Personal history of other endocrine, nutritional and metabolic disease: Secondary | ICD-10-CM | POA: Diagnosis not present

## 2021-09-29 DIAGNOSIS — E7800 Pure hypercholesterolemia, unspecified: Secondary | ICD-10-CM | POA: Diagnosis not present

## 2021-09-29 DIAGNOSIS — M8588 Other specified disorders of bone density and structure, other site: Secondary | ICD-10-CM | POA: Diagnosis not present

## 2021-09-29 DIAGNOSIS — Z125 Encounter for screening for malignant neoplasm of prostate: Secondary | ICD-10-CM | POA: Diagnosis not present

## 2021-09-29 DIAGNOSIS — Z79899 Other long term (current) drug therapy: Secondary | ICD-10-CM | POA: Diagnosis not present

## 2021-09-29 DIAGNOSIS — E119 Type 2 diabetes mellitus without complications: Secondary | ICD-10-CM | POA: Diagnosis not present

## 2021-09-29 DIAGNOSIS — E785 Hyperlipidemia, unspecified: Secondary | ICD-10-CM | POA: Diagnosis not present

## 2021-09-30 DIAGNOSIS — M25611 Stiffness of right shoulder, not elsewhere classified: Secondary | ICD-10-CM | POA: Diagnosis not present

## 2021-09-30 DIAGNOSIS — R531 Weakness: Secondary | ICD-10-CM | POA: Diagnosis not present

## 2021-10-05 DIAGNOSIS — M47816 Spondylosis without myelopathy or radiculopathy, lumbar region: Secondary | ICD-10-CM | POA: Diagnosis not present

## 2021-10-05 DIAGNOSIS — M25611 Stiffness of right shoulder, not elsewhere classified: Secondary | ICD-10-CM | POA: Diagnosis not present

## 2021-10-05 DIAGNOSIS — R531 Weakness: Secondary | ICD-10-CM | POA: Diagnosis not present

## 2021-10-08 DIAGNOSIS — G4733 Obstructive sleep apnea (adult) (pediatric): Secondary | ICD-10-CM | POA: Diagnosis not present

## 2021-10-08 DIAGNOSIS — Z0000 Encounter for general adult medical examination without abnormal findings: Secondary | ICD-10-CM | POA: Diagnosis not present

## 2021-10-08 DIAGNOSIS — E1169 Type 2 diabetes mellitus with other specified complication: Secondary | ICD-10-CM | POA: Diagnosis not present

## 2021-10-08 DIAGNOSIS — R051 Acute cough: Secondary | ICD-10-CM | POA: Diagnosis not present

## 2021-10-08 DIAGNOSIS — G8929 Other chronic pain: Secondary | ICD-10-CM | POA: Diagnosis not present

## 2021-10-08 DIAGNOSIS — E7800 Pure hypercholesterolemia, unspecified: Secondary | ICD-10-CM | POA: Diagnosis not present

## 2021-10-08 DIAGNOSIS — M519 Unspecified thoracic, thoracolumbar and lumbosacral intervertebral disc disorder: Secondary | ICD-10-CM | POA: Diagnosis not present

## 2021-10-08 DIAGNOSIS — Z23 Encounter for immunization: Secondary | ICD-10-CM | POA: Diagnosis not present

## 2021-10-08 DIAGNOSIS — M5090 Cervical disc disorder, unspecified, unspecified cervical region: Secondary | ICD-10-CM | POA: Diagnosis not present

## 2021-10-08 DIAGNOSIS — E039 Hypothyroidism, unspecified: Secondary | ICD-10-CM | POA: Diagnosis not present

## 2021-10-08 DIAGNOSIS — Z8639 Personal history of other endocrine, nutritional and metabolic disease: Secondary | ICD-10-CM | POA: Diagnosis not present

## 2021-10-08 DIAGNOSIS — Z7984 Long term (current) use of oral hypoglycemic drugs: Secondary | ICD-10-CM | POA: Diagnosis not present

## 2021-10-12 DIAGNOSIS — R531 Weakness: Secondary | ICD-10-CM | POA: Diagnosis not present

## 2021-10-12 DIAGNOSIS — M25611 Stiffness of right shoulder, not elsewhere classified: Secondary | ICD-10-CM | POA: Diagnosis not present

## 2021-10-13 DIAGNOSIS — M25662 Stiffness of left knee, not elsewhere classified: Secondary | ICD-10-CM | POA: Diagnosis not present

## 2021-10-13 DIAGNOSIS — Z01818 Encounter for other preprocedural examination: Secondary | ICD-10-CM | POA: Diagnosis not present

## 2021-10-13 DIAGNOSIS — M6281 Muscle weakness (generalized): Secondary | ICD-10-CM | POA: Diagnosis not present

## 2021-10-13 DIAGNOSIS — G479 Sleep disorder, unspecified: Secondary | ICD-10-CM | POA: Diagnosis not present

## 2021-10-13 DIAGNOSIS — E1169 Type 2 diabetes mellitus with other specified complication: Secondary | ICD-10-CM | POA: Diagnosis not present

## 2021-10-13 DIAGNOSIS — G4733 Obstructive sleep apnea (adult) (pediatric): Secondary | ICD-10-CM | POA: Diagnosis not present

## 2021-10-13 DIAGNOSIS — M1712 Unilateral primary osteoarthritis, left knee: Secondary | ICD-10-CM | POA: Diagnosis not present

## 2021-10-20 DIAGNOSIS — M1712 Unilateral primary osteoarthritis, left knee: Secondary | ICD-10-CM | POA: Diagnosis not present

## 2021-10-21 DIAGNOSIS — M47816 Spondylosis without myelopathy or radiculopathy, lumbar region: Secondary | ICD-10-CM | POA: Diagnosis not present

## 2021-10-28 DIAGNOSIS — Z6829 Body mass index (BMI) 29.0-29.9, adult: Secondary | ICD-10-CM | POA: Diagnosis not present

## 2021-10-28 DIAGNOSIS — R030 Elevated blood-pressure reading, without diagnosis of hypertension: Secondary | ICD-10-CM | POA: Diagnosis not present

## 2021-10-28 DIAGNOSIS — M5416 Radiculopathy, lumbar region: Secondary | ICD-10-CM | POA: Diagnosis not present

## 2021-10-28 DIAGNOSIS — G894 Chronic pain syndrome: Secondary | ICD-10-CM | POA: Diagnosis not present

## 2021-10-28 DIAGNOSIS — U071 COVID-19: Secondary | ICD-10-CM | POA: Diagnosis not present

## 2021-10-29 DIAGNOSIS — Z01812 Encounter for preprocedural laboratory examination: Secondary | ICD-10-CM | POA: Diagnosis not present

## 2021-10-29 DIAGNOSIS — M25562 Pain in left knee: Secondary | ICD-10-CM | POA: Diagnosis not present

## 2021-10-29 DIAGNOSIS — M1712 Unilateral primary osteoarthritis, left knee: Secondary | ICD-10-CM | POA: Diagnosis not present

## 2021-10-29 DIAGNOSIS — E559 Vitamin D deficiency, unspecified: Secondary | ICD-10-CM | POA: Diagnosis not present

## 2021-11-03 DIAGNOSIS — L570 Actinic keratosis: Secondary | ICD-10-CM | POA: Diagnosis not present

## 2021-11-03 DIAGNOSIS — Z85828 Personal history of other malignant neoplasm of skin: Secondary | ICD-10-CM | POA: Diagnosis not present

## 2021-11-04 DIAGNOSIS — M1712 Unilateral primary osteoarthritis, left knee: Secondary | ICD-10-CM | POA: Diagnosis not present

## 2021-11-04 DIAGNOSIS — G8918 Other acute postprocedural pain: Secondary | ICD-10-CM | POA: Diagnosis not present

## 2021-11-06 DIAGNOSIS — Z96652 Presence of left artificial knee joint: Secondary | ICD-10-CM | POA: Diagnosis not present

## 2021-11-06 DIAGNOSIS — M6281 Muscle weakness (generalized): Secondary | ICD-10-CM | POA: Diagnosis not present

## 2021-11-06 DIAGNOSIS — M25662 Stiffness of left knee, not elsewhere classified: Secondary | ICD-10-CM | POA: Diagnosis not present

## 2021-11-09 DIAGNOSIS — M6281 Muscle weakness (generalized): Secondary | ICD-10-CM | POA: Diagnosis not present

## 2021-11-09 DIAGNOSIS — M25662 Stiffness of left knee, not elsewhere classified: Secondary | ICD-10-CM | POA: Diagnosis not present

## 2021-11-09 DIAGNOSIS — Z96652 Presence of left artificial knee joint: Secondary | ICD-10-CM | POA: Diagnosis not present

## 2021-11-11 DIAGNOSIS — M6281 Muscle weakness (generalized): Secondary | ICD-10-CM | POA: Diagnosis not present

## 2021-11-11 DIAGNOSIS — M25662 Stiffness of left knee, not elsewhere classified: Secondary | ICD-10-CM | POA: Diagnosis not present

## 2021-11-11 DIAGNOSIS — Z96652 Presence of left artificial knee joint: Secondary | ICD-10-CM | POA: Diagnosis not present

## 2021-11-13 DIAGNOSIS — M25662 Stiffness of left knee, not elsewhere classified: Secondary | ICD-10-CM | POA: Diagnosis not present

## 2021-11-13 DIAGNOSIS — Z96652 Presence of left artificial knee joint: Secondary | ICD-10-CM | POA: Diagnosis not present

## 2021-11-13 DIAGNOSIS — M6281 Muscle weakness (generalized): Secondary | ICD-10-CM | POA: Diagnosis not present

## 2021-11-18 DIAGNOSIS — M6281 Muscle weakness (generalized): Secondary | ICD-10-CM | POA: Diagnosis not present

## 2021-11-18 DIAGNOSIS — M1712 Unilateral primary osteoarthritis, left knee: Secondary | ICD-10-CM | POA: Diagnosis not present

## 2021-11-18 DIAGNOSIS — Z96652 Presence of left artificial knee joint: Secondary | ICD-10-CM | POA: Diagnosis not present

## 2021-11-18 DIAGNOSIS — M25662 Stiffness of left knee, not elsewhere classified: Secondary | ICD-10-CM | POA: Diagnosis not present

## 2021-11-19 ENCOUNTER — Other Ambulatory Visit (HOSPITAL_COMMUNITY): Payer: Self-pay | Admitting: Physician Assistant

## 2021-11-19 ENCOUNTER — Ambulatory Visit (HOSPITAL_COMMUNITY)
Admission: RE | Admit: 2021-11-19 | Discharge: 2021-11-19 | Disposition: A | Discharge status: Home / Self Care | Payer: Medicare Other | Source: Ambulatory Visit | Attending: Orthopedic Surgery | Admitting: Orthopedic Surgery

## 2021-11-19 ENCOUNTER — Other Ambulatory Visit: Payer: Self-pay

## 2021-11-19 DIAGNOSIS — M7989 Other specified soft tissue disorders: Secondary | ICD-10-CM | POA: Insufficient documentation

## 2021-11-19 DIAGNOSIS — M79662 Pain in left lower leg: Secondary | ICD-10-CM | POA: Insufficient documentation

## 2021-11-19 DIAGNOSIS — M6281 Muscle weakness (generalized): Secondary | ICD-10-CM | POA: Diagnosis not present

## 2021-11-19 DIAGNOSIS — M25662 Stiffness of left knee, not elsewhere classified: Secondary | ICD-10-CM | POA: Diagnosis not present

## 2021-11-19 DIAGNOSIS — Z96652 Presence of left artificial knee joint: Secondary | ICD-10-CM | POA: Diagnosis not present

## 2021-11-20 DIAGNOSIS — Z96652 Presence of left artificial knee joint: Secondary | ICD-10-CM | POA: Diagnosis not present

## 2021-11-20 DIAGNOSIS — M25662 Stiffness of left knee, not elsewhere classified: Secondary | ICD-10-CM | POA: Diagnosis not present

## 2021-11-20 DIAGNOSIS — M6281 Muscle weakness (generalized): Secondary | ICD-10-CM | POA: Diagnosis not present

## 2021-11-24 DIAGNOSIS — M25662 Stiffness of left knee, not elsewhere classified: Secondary | ICD-10-CM | POA: Diagnosis not present

## 2021-11-24 DIAGNOSIS — Z96652 Presence of left artificial knee joint: Secondary | ICD-10-CM | POA: Diagnosis not present

## 2021-11-24 DIAGNOSIS — M6281 Muscle weakness (generalized): Secondary | ICD-10-CM | POA: Diagnosis not present

## 2021-11-26 DIAGNOSIS — M6281 Muscle weakness (generalized): Secondary | ICD-10-CM | POA: Diagnosis not present

## 2021-11-26 DIAGNOSIS — M25662 Stiffness of left knee, not elsewhere classified: Secondary | ICD-10-CM | POA: Diagnosis not present

## 2021-11-26 DIAGNOSIS — Z96652 Presence of left artificial knee joint: Secondary | ICD-10-CM | POA: Diagnosis not present

## 2021-11-30 DIAGNOSIS — M25662 Stiffness of left knee, not elsewhere classified: Secondary | ICD-10-CM | POA: Diagnosis not present

## 2021-11-30 DIAGNOSIS — Z96652 Presence of left artificial knee joint: Secondary | ICD-10-CM | POA: Diagnosis not present

## 2021-11-30 DIAGNOSIS — M6281 Muscle weakness (generalized): Secondary | ICD-10-CM | POA: Diagnosis not present

## 2021-12-01 DIAGNOSIS — R269 Unspecified abnormalities of gait and mobility: Secondary | ICD-10-CM | POA: Diagnosis not present

## 2021-12-01 DIAGNOSIS — M545 Low back pain, unspecified: Secondary | ICD-10-CM | POA: Diagnosis not present

## 2021-12-01 DIAGNOSIS — R0602 Shortness of breath: Secondary | ICD-10-CM | POA: Diagnosis not present

## 2021-12-01 DIAGNOSIS — R059 Cough, unspecified: Secondary | ICD-10-CM | POA: Diagnosis not present

## 2021-12-01 DIAGNOSIS — Z20822 Contact with and (suspected) exposure to covid-19: Secondary | ICD-10-CM | POA: Diagnosis not present

## 2021-12-01 DIAGNOSIS — M6258 Muscle wasting and atrophy, not elsewhere classified, other site: Secondary | ICD-10-CM | POA: Diagnosis not present

## 2021-12-01 DIAGNOSIS — M2569 Stiffness of other specified joint, not elsewhere classified: Secondary | ICD-10-CM | POA: Diagnosis not present

## 2021-12-01 DIAGNOSIS — Z89411 Acquired absence of right great toe: Secondary | ICD-10-CM | POA: Diagnosis not present

## 2021-12-02 DIAGNOSIS — M6281 Muscle weakness (generalized): Secondary | ICD-10-CM | POA: Diagnosis not present

## 2021-12-02 DIAGNOSIS — M25662 Stiffness of left knee, not elsewhere classified: Secondary | ICD-10-CM | POA: Diagnosis not present

## 2021-12-02 DIAGNOSIS — Z96652 Presence of left artificial knee joint: Secondary | ICD-10-CM | POA: Diagnosis not present

## 2021-12-03 DIAGNOSIS — M545 Low back pain, unspecified: Secondary | ICD-10-CM | POA: Diagnosis not present

## 2021-12-03 DIAGNOSIS — M2569 Stiffness of other specified joint, not elsewhere classified: Secondary | ICD-10-CM | POA: Diagnosis not present

## 2021-12-03 DIAGNOSIS — M1712 Unilateral primary osteoarthritis, left knee: Secondary | ICD-10-CM | POA: Diagnosis not present

## 2021-12-03 DIAGNOSIS — M6258 Muscle wasting and atrophy, not elsewhere classified, other site: Secondary | ICD-10-CM | POA: Diagnosis not present

## 2021-12-03 DIAGNOSIS — R269 Unspecified abnormalities of gait and mobility: Secondary | ICD-10-CM | POA: Diagnosis not present

## 2021-12-07 DIAGNOSIS — M6281 Muscle weakness (generalized): Secondary | ICD-10-CM | POA: Diagnosis not present

## 2021-12-07 DIAGNOSIS — M25662 Stiffness of left knee, not elsewhere classified: Secondary | ICD-10-CM | POA: Diagnosis not present

## 2021-12-07 DIAGNOSIS — Z96652 Presence of left artificial knee joint: Secondary | ICD-10-CM | POA: Diagnosis not present

## 2021-12-08 DIAGNOSIS — M6258 Muscle wasting and atrophy, not elsewhere classified, other site: Secondary | ICD-10-CM | POA: Diagnosis not present

## 2021-12-08 DIAGNOSIS — M2569 Stiffness of other specified joint, not elsewhere classified: Secondary | ICD-10-CM | POA: Diagnosis not present

## 2021-12-08 DIAGNOSIS — R269 Unspecified abnormalities of gait and mobility: Secondary | ICD-10-CM | POA: Diagnosis not present

## 2021-12-08 DIAGNOSIS — M545 Low back pain, unspecified: Secondary | ICD-10-CM | POA: Diagnosis not present

## 2021-12-09 DIAGNOSIS — M25662 Stiffness of left knee, not elsewhere classified: Secondary | ICD-10-CM | POA: Diagnosis not present

## 2021-12-09 DIAGNOSIS — M6281 Muscle weakness (generalized): Secondary | ICD-10-CM | POA: Diagnosis not present

## 2021-12-09 DIAGNOSIS — Z96652 Presence of left artificial knee joint: Secondary | ICD-10-CM | POA: Diagnosis not present

## 2021-12-14 DIAGNOSIS — M25662 Stiffness of left knee, not elsewhere classified: Secondary | ICD-10-CM | POA: Diagnosis not present

## 2021-12-14 DIAGNOSIS — Z96652 Presence of left artificial knee joint: Secondary | ICD-10-CM | POA: Diagnosis not present

## 2021-12-14 DIAGNOSIS — M6281 Muscle weakness (generalized): Secondary | ICD-10-CM | POA: Diagnosis not present

## 2021-12-18 DIAGNOSIS — M25662 Stiffness of left knee, not elsewhere classified: Secondary | ICD-10-CM | POA: Diagnosis not present

## 2021-12-18 DIAGNOSIS — M6281 Muscle weakness (generalized): Secondary | ICD-10-CM | POA: Diagnosis not present

## 2021-12-18 DIAGNOSIS — Z96652 Presence of left artificial knee joint: Secondary | ICD-10-CM | POA: Diagnosis not present

## 2021-12-30 DIAGNOSIS — M1712 Unilateral primary osteoarthritis, left knee: Secondary | ICD-10-CM | POA: Diagnosis not present

## 2022-01-04 DIAGNOSIS — E119 Type 2 diabetes mellitus without complications: Secondary | ICD-10-CM | POA: Diagnosis not present

## 2022-01-19 DIAGNOSIS — M47816 Spondylosis without myelopathy or radiculopathy, lumbar region: Secondary | ICD-10-CM | POA: Diagnosis not present

## 2022-01-19 DIAGNOSIS — M7138 Other bursal cyst, other site: Secondary | ICD-10-CM | POA: Diagnosis not present

## 2022-01-19 DIAGNOSIS — M5416 Radiculopathy, lumbar region: Secondary | ICD-10-CM | POA: Diagnosis not present

## 2022-01-19 DIAGNOSIS — G894 Chronic pain syndrome: Secondary | ICD-10-CM | POA: Diagnosis not present

## 2022-01-22 DIAGNOSIS — E785 Hyperlipidemia, unspecified: Secondary | ICD-10-CM | POA: Diagnosis not present

## 2022-01-22 DIAGNOSIS — E039 Hypothyroidism, unspecified: Secondary | ICD-10-CM | POA: Diagnosis not present

## 2022-01-22 DIAGNOSIS — E119 Type 2 diabetes mellitus without complications: Secondary | ICD-10-CM | POA: Diagnosis not present

## 2022-01-28 DIAGNOSIS — F4542 Pain disorder with related psychological factors: Secondary | ICD-10-CM | POA: Diagnosis not present

## 2022-02-16 DIAGNOSIS — M7138 Other bursal cyst, other site: Secondary | ICD-10-CM | POA: Diagnosis not present

## 2022-02-24 DIAGNOSIS — Z20822 Contact with and (suspected) exposure to covid-19: Secondary | ICD-10-CM | POA: Diagnosis not present

## 2022-03-01 DIAGNOSIS — M1712 Unilateral primary osteoarthritis, left knee: Secondary | ICD-10-CM | POA: Diagnosis not present

## 2022-03-11 DIAGNOSIS — Z20822 Contact with and (suspected) exposure to covid-19: Secondary | ICD-10-CM | POA: Diagnosis not present

## 2022-03-17 DIAGNOSIS — E039 Hypothyroidism, unspecified: Secondary | ICD-10-CM | POA: Diagnosis not present

## 2022-03-17 DIAGNOSIS — E119 Type 2 diabetes mellitus without complications: Secondary | ICD-10-CM | POA: Diagnosis not present

## 2022-03-17 DIAGNOSIS — E785 Hyperlipidemia, unspecified: Secondary | ICD-10-CM | POA: Diagnosis not present

## 2022-03-19 DIAGNOSIS — G894 Chronic pain syndrome: Secondary | ICD-10-CM | POA: Diagnosis not present

## 2022-03-19 DIAGNOSIS — M960 Pseudarthrosis after fusion or arthrodesis: Secondary | ICD-10-CM | POA: Diagnosis not present

## 2022-03-19 DIAGNOSIS — M5380 Other specified dorsopathies, site unspecified: Secondary | ICD-10-CM | POA: Diagnosis not present

## 2022-03-19 DIAGNOSIS — M47816 Spondylosis without myelopathy or radiculopathy, lumbar region: Secondary | ICD-10-CM | POA: Diagnosis not present

## 2022-03-22 DIAGNOSIS — G4733 Obstructive sleep apnea (adult) (pediatric): Secondary | ICD-10-CM | POA: Diagnosis not present

## 2022-03-23 DIAGNOSIS — Z6831 Body mass index (BMI) 31.0-31.9, adult: Secondary | ICD-10-CM | POA: Diagnosis not present

## 2022-03-23 DIAGNOSIS — E785 Hyperlipidemia, unspecified: Secondary | ICD-10-CM | POA: Diagnosis not present

## 2022-03-23 DIAGNOSIS — Z6832 Body mass index (BMI) 32.0-32.9, adult: Secondary | ICD-10-CM | POA: Diagnosis not present

## 2022-03-23 DIAGNOSIS — R059 Cough, unspecified: Secondary | ICD-10-CM | POA: Diagnosis not present

## 2022-03-23 DIAGNOSIS — E039 Hypothyroidism, unspecified: Secondary | ICD-10-CM | POA: Diagnosis not present

## 2022-03-23 DIAGNOSIS — R0981 Nasal congestion: Secondary | ICD-10-CM | POA: Diagnosis not present

## 2022-03-23 DIAGNOSIS — G5793 Unspecified mononeuropathy of bilateral lower limbs: Secondary | ICD-10-CM | POA: Diagnosis not present

## 2022-03-23 DIAGNOSIS — E1169 Type 2 diabetes mellitus with other specified complication: Secondary | ICD-10-CM | POA: Diagnosis not present

## 2022-03-24 DIAGNOSIS — E119 Type 2 diabetes mellitus without complications: Secondary | ICD-10-CM | POA: Diagnosis not present

## 2022-04-05 DIAGNOSIS — Z6832 Body mass index (BMI) 32.0-32.9, adult: Secondary | ICD-10-CM | POA: Diagnosis not present

## 2022-04-05 DIAGNOSIS — M519 Unspecified thoracic, thoracolumbar and lumbosacral intervertebral disc disorder: Secondary | ICD-10-CM | POA: Diagnosis not present

## 2022-04-05 DIAGNOSIS — E1169 Type 2 diabetes mellitus with other specified complication: Secondary | ICD-10-CM | POA: Diagnosis not present

## 2022-04-05 DIAGNOSIS — G479 Sleep disorder, unspecified: Secondary | ICD-10-CM | POA: Diagnosis not present

## 2022-04-05 DIAGNOSIS — E538 Deficiency of other specified B group vitamins: Secondary | ICD-10-CM | POA: Diagnosis not present

## 2022-04-05 DIAGNOSIS — R0602 Shortness of breath: Secondary | ICD-10-CM | POA: Diagnosis not present

## 2022-04-05 DIAGNOSIS — E7800 Pure hypercholesterolemia, unspecified: Secondary | ICD-10-CM | POA: Diagnosis not present

## 2022-04-05 DIAGNOSIS — R051 Acute cough: Secondary | ICD-10-CM | POA: Diagnosis not present

## 2022-04-05 DIAGNOSIS — G8929 Other chronic pain: Secondary | ICD-10-CM | POA: Diagnosis not present

## 2022-04-05 DIAGNOSIS — F339 Major depressive disorder, recurrent, unspecified: Secondary | ICD-10-CM | POA: Diagnosis not present

## 2022-04-05 DIAGNOSIS — E039 Hypothyroidism, unspecified: Secondary | ICD-10-CM | POA: Diagnosis not present

## 2022-04-07 DIAGNOSIS — I739 Peripheral vascular disease, unspecified: Secondary | ICD-10-CM | POA: Diagnosis not present

## 2022-04-07 DIAGNOSIS — M21961 Unspecified acquired deformity of right lower leg: Secondary | ICD-10-CM | POA: Diagnosis not present

## 2022-04-07 DIAGNOSIS — E1142 Type 2 diabetes mellitus with diabetic polyneuropathy: Secondary | ICD-10-CM | POA: Diagnosis not present

## 2022-04-07 DIAGNOSIS — M2022 Hallux rigidus, left foot: Secondary | ICD-10-CM | POA: Diagnosis not present

## 2022-04-07 DIAGNOSIS — S98911A Complete traumatic amputation of right foot, level unspecified, initial encounter: Secondary | ICD-10-CM | POA: Diagnosis not present

## 2022-04-15 DIAGNOSIS — I739 Peripheral vascular disease, unspecified: Secondary | ICD-10-CM | POA: Diagnosis not present

## 2022-04-20 DIAGNOSIS — E785 Hyperlipidemia, unspecified: Secondary | ICD-10-CM | POA: Diagnosis not present

## 2022-04-20 DIAGNOSIS — G5793 Unspecified mononeuropathy of bilateral lower limbs: Secondary | ICD-10-CM | POA: Diagnosis not present

## 2022-04-20 DIAGNOSIS — E1169 Type 2 diabetes mellitus with other specified complication: Secondary | ICD-10-CM | POA: Diagnosis not present

## 2022-04-20 DIAGNOSIS — Z6829 Body mass index (BMI) 29.0-29.9, adult: Secondary | ICD-10-CM | POA: Diagnosis not present

## 2022-04-20 DIAGNOSIS — E039 Hypothyroidism, unspecified: Secondary | ICD-10-CM | POA: Diagnosis not present

## 2022-04-30 DIAGNOSIS — E039 Hypothyroidism, unspecified: Secondary | ICD-10-CM | POA: Diagnosis not present

## 2022-04-30 DIAGNOSIS — G8929 Other chronic pain: Secondary | ICD-10-CM | POA: Diagnosis not present

## 2022-04-30 DIAGNOSIS — E1165 Type 2 diabetes mellitus with hyperglycemia: Secondary | ICD-10-CM | POA: Diagnosis not present

## 2022-04-30 DIAGNOSIS — E7800 Pure hypercholesterolemia, unspecified: Secondary | ICD-10-CM | POA: Diagnosis not present

## 2022-05-03 DIAGNOSIS — K649 Unspecified hemorrhoids: Secondary | ICD-10-CM | POA: Diagnosis not present

## 2022-05-03 DIAGNOSIS — K5730 Diverticulosis of large intestine without perforation or abscess without bleeding: Secondary | ICD-10-CM | POA: Diagnosis not present

## 2022-05-03 DIAGNOSIS — Z1211 Encounter for screening for malignant neoplasm of colon: Secondary | ICD-10-CM | POA: Diagnosis not present

## 2022-05-03 DIAGNOSIS — D122 Benign neoplasm of ascending colon: Secondary | ICD-10-CM | POA: Diagnosis not present

## 2022-05-04 DIAGNOSIS — M545 Low back pain, unspecified: Secondary | ICD-10-CM | POA: Diagnosis not present

## 2022-05-04 DIAGNOSIS — R293 Abnormal posture: Secondary | ICD-10-CM | POA: Diagnosis not present

## 2022-05-04 DIAGNOSIS — M2569 Stiffness of other specified joint, not elsewhere classified: Secondary | ICD-10-CM | POA: Diagnosis not present

## 2022-05-04 DIAGNOSIS — M6259 Muscle wasting and atrophy, not elsewhere classified, multiple sites: Secondary | ICD-10-CM | POA: Diagnosis not present

## 2022-05-05 DIAGNOSIS — D122 Benign neoplasm of ascending colon: Secondary | ICD-10-CM | POA: Diagnosis not present

## 2022-05-07 DIAGNOSIS — M6259 Muscle wasting and atrophy, not elsewhere classified, multiple sites: Secondary | ICD-10-CM | POA: Diagnosis not present

## 2022-05-07 DIAGNOSIS — M545 Low back pain, unspecified: Secondary | ICD-10-CM | POA: Diagnosis not present

## 2022-05-07 DIAGNOSIS — M2569 Stiffness of other specified joint, not elsewhere classified: Secondary | ICD-10-CM | POA: Diagnosis not present

## 2022-05-07 DIAGNOSIS — R293 Abnormal posture: Secondary | ICD-10-CM | POA: Diagnosis not present

## 2022-05-10 DIAGNOSIS — R293 Abnormal posture: Secondary | ICD-10-CM | POA: Diagnosis not present

## 2022-05-10 DIAGNOSIS — M2569 Stiffness of other specified joint, not elsewhere classified: Secondary | ICD-10-CM | POA: Diagnosis not present

## 2022-05-10 DIAGNOSIS — M545 Low back pain, unspecified: Secondary | ICD-10-CM | POA: Diagnosis not present

## 2022-05-10 DIAGNOSIS — M6259 Muscle wasting and atrophy, not elsewhere classified, multiple sites: Secondary | ICD-10-CM | POA: Diagnosis not present

## 2022-05-17 DIAGNOSIS — M2569 Stiffness of other specified joint, not elsewhere classified: Secondary | ICD-10-CM | POA: Diagnosis not present

## 2022-05-17 DIAGNOSIS — M545 Low back pain, unspecified: Secondary | ICD-10-CM | POA: Diagnosis not present

## 2022-05-17 DIAGNOSIS — M6259 Muscle wasting and atrophy, not elsewhere classified, multiple sites: Secondary | ICD-10-CM | POA: Diagnosis not present

## 2022-05-17 DIAGNOSIS — R293 Abnormal posture: Secondary | ICD-10-CM | POA: Diagnosis not present

## 2022-05-18 DIAGNOSIS — M6259 Muscle wasting and atrophy, not elsewhere classified, multiple sites: Secondary | ICD-10-CM | POA: Diagnosis not present

## 2022-05-18 DIAGNOSIS — M2569 Stiffness of other specified joint, not elsewhere classified: Secondary | ICD-10-CM | POA: Diagnosis not present

## 2022-05-18 DIAGNOSIS — R293 Abnormal posture: Secondary | ICD-10-CM | POA: Diagnosis not present

## 2022-05-18 DIAGNOSIS — M545 Low back pain, unspecified: Secondary | ICD-10-CM | POA: Diagnosis not present

## 2022-05-19 DIAGNOSIS — M2022 Hallux rigidus, left foot: Secondary | ICD-10-CM | POA: Diagnosis not present

## 2022-05-19 DIAGNOSIS — E1142 Type 2 diabetes mellitus with diabetic polyneuropathy: Secondary | ICD-10-CM | POA: Diagnosis not present

## 2022-05-19 DIAGNOSIS — I739 Peripheral vascular disease, unspecified: Secondary | ICD-10-CM | POA: Diagnosis not present

## 2022-05-19 DIAGNOSIS — M21961 Unspecified acquired deformity of right lower leg: Secondary | ICD-10-CM | POA: Diagnosis not present

## 2022-05-24 DIAGNOSIS — M6259 Muscle wasting and atrophy, not elsewhere classified, multiple sites: Secondary | ICD-10-CM | POA: Diagnosis not present

## 2022-05-24 DIAGNOSIS — M545 Low back pain, unspecified: Secondary | ICD-10-CM | POA: Diagnosis not present

## 2022-05-24 DIAGNOSIS — M2569 Stiffness of other specified joint, not elsewhere classified: Secondary | ICD-10-CM | POA: Diagnosis not present

## 2022-05-24 DIAGNOSIS — R293 Abnormal posture: Secondary | ICD-10-CM | POA: Diagnosis not present

## 2022-05-26 DIAGNOSIS — E785 Hyperlipidemia, unspecified: Secondary | ICD-10-CM | POA: Diagnosis not present

## 2022-05-26 DIAGNOSIS — E119 Type 2 diabetes mellitus without complications: Secondary | ICD-10-CM | POA: Diagnosis not present

## 2022-05-26 DIAGNOSIS — E039 Hypothyroidism, unspecified: Secondary | ICD-10-CM | POA: Diagnosis not present

## 2022-05-31 DIAGNOSIS — L821 Other seborrheic keratosis: Secondary | ICD-10-CM | POA: Diagnosis not present

## 2022-05-31 DIAGNOSIS — R293 Abnormal posture: Secondary | ICD-10-CM | POA: Diagnosis not present

## 2022-05-31 DIAGNOSIS — Z85828 Personal history of other malignant neoplasm of skin: Secondary | ICD-10-CM | POA: Diagnosis not present

## 2022-05-31 DIAGNOSIS — D2262 Melanocytic nevi of left upper limb, including shoulder: Secondary | ICD-10-CM | POA: Diagnosis not present

## 2022-05-31 DIAGNOSIS — L814 Other melanin hyperpigmentation: Secondary | ICD-10-CM | POA: Diagnosis not present

## 2022-05-31 DIAGNOSIS — D1801 Hemangioma of skin and subcutaneous tissue: Secondary | ICD-10-CM | POA: Diagnosis not present

## 2022-05-31 DIAGNOSIS — D225 Melanocytic nevi of trunk: Secondary | ICD-10-CM | POA: Diagnosis not present

## 2022-05-31 DIAGNOSIS — M2569 Stiffness of other specified joint, not elsewhere classified: Secondary | ICD-10-CM | POA: Diagnosis not present

## 2022-05-31 DIAGNOSIS — M6259 Muscle wasting and atrophy, not elsewhere classified, multiple sites: Secondary | ICD-10-CM | POA: Diagnosis not present

## 2022-05-31 DIAGNOSIS — M545 Low back pain, unspecified: Secondary | ICD-10-CM | POA: Diagnosis not present

## 2022-05-31 DIAGNOSIS — L578 Other skin changes due to chronic exposure to nonionizing radiation: Secondary | ICD-10-CM | POA: Diagnosis not present

## 2022-05-31 DIAGNOSIS — L570 Actinic keratosis: Secondary | ICD-10-CM | POA: Diagnosis not present

## 2022-06-01 DIAGNOSIS — E1169 Type 2 diabetes mellitus with other specified complication: Secondary | ICD-10-CM | POA: Diagnosis not present

## 2022-06-01 DIAGNOSIS — E785 Hyperlipidemia, unspecified: Secondary | ICD-10-CM | POA: Diagnosis not present

## 2022-06-01 DIAGNOSIS — E119 Type 2 diabetes mellitus without complications: Secondary | ICD-10-CM | POA: Diagnosis not present

## 2022-06-01 DIAGNOSIS — E039 Hypothyroidism, unspecified: Secondary | ICD-10-CM | POA: Diagnosis not present

## 2022-06-02 DIAGNOSIS — R051 Acute cough: Secondary | ICD-10-CM | POA: Diagnosis not present

## 2022-06-02 DIAGNOSIS — J0140 Acute pansinusitis, unspecified: Secondary | ICD-10-CM | POA: Diagnosis not present

## 2022-06-14 DIAGNOSIS — M5416 Radiculopathy, lumbar region: Secondary | ICD-10-CM | POA: Diagnosis not present

## 2022-06-21 DIAGNOSIS — M2569 Stiffness of other specified joint, not elsewhere classified: Secondary | ICD-10-CM | POA: Diagnosis not present

## 2022-06-21 DIAGNOSIS — M6259 Muscle wasting and atrophy, not elsewhere classified, multiple sites: Secondary | ICD-10-CM | POA: Diagnosis not present

## 2022-06-21 DIAGNOSIS — R293 Abnormal posture: Secondary | ICD-10-CM | POA: Diagnosis not present

## 2022-06-21 DIAGNOSIS — M545 Low back pain, unspecified: Secondary | ICD-10-CM | POA: Diagnosis not present

## 2022-06-22 DIAGNOSIS — I739 Peripheral vascular disease, unspecified: Secondary | ICD-10-CM | POA: Diagnosis not present

## 2022-06-22 DIAGNOSIS — B351 Tinea unguium: Secondary | ICD-10-CM | POA: Diagnosis not present

## 2022-06-22 DIAGNOSIS — R293 Abnormal posture: Secondary | ICD-10-CM | POA: Diagnosis not present

## 2022-06-22 DIAGNOSIS — M545 Low back pain, unspecified: Secondary | ICD-10-CM | POA: Diagnosis not present

## 2022-06-22 DIAGNOSIS — E1142 Type 2 diabetes mellitus with diabetic polyneuropathy: Secondary | ICD-10-CM | POA: Diagnosis not present

## 2022-06-22 DIAGNOSIS — M6259 Muscle wasting and atrophy, not elsewhere classified, multiple sites: Secondary | ICD-10-CM | POA: Diagnosis not present

## 2022-06-22 DIAGNOSIS — M2022 Hallux rigidus, left foot: Secondary | ICD-10-CM | POA: Diagnosis not present

## 2022-06-22 DIAGNOSIS — M21961 Unspecified acquired deformity of right lower leg: Secondary | ICD-10-CM | POA: Diagnosis not present

## 2022-06-22 DIAGNOSIS — L84 Corns and callosities: Secondary | ICD-10-CM | POA: Diagnosis not present

## 2022-06-22 DIAGNOSIS — M2569 Stiffness of other specified joint, not elsewhere classified: Secondary | ICD-10-CM | POA: Diagnosis not present

## 2022-07-28 DIAGNOSIS — Z6828 Body mass index (BMI) 28.0-28.9, adult: Secondary | ICD-10-CM | POA: Diagnosis not present

## 2022-07-28 DIAGNOSIS — M7138 Other bursal cyst, other site: Secondary | ICD-10-CM | POA: Diagnosis not present

## 2022-07-28 DIAGNOSIS — E1142 Type 2 diabetes mellitus with diabetic polyneuropathy: Secondary | ICD-10-CM | POA: Diagnosis not present

## 2022-07-28 DIAGNOSIS — G894 Chronic pain syndrome: Secondary | ICD-10-CM | POA: Diagnosis not present

## 2022-07-28 DIAGNOSIS — M5416 Radiculopathy, lumbar region: Secondary | ICD-10-CM | POA: Diagnosis not present

## 2022-07-29 DIAGNOSIS — Z6828 Body mass index (BMI) 28.0-28.9, adult: Secondary | ICD-10-CM | POA: Diagnosis not present

## 2022-07-29 DIAGNOSIS — E860 Dehydration: Secondary | ICD-10-CM | POA: Diagnosis not present

## 2022-08-11 DIAGNOSIS — M47816 Spondylosis without myelopathy or radiculopathy, lumbar region: Secondary | ICD-10-CM | POA: Diagnosis not present

## 2022-08-31 DIAGNOSIS — M2022 Hallux rigidus, left foot: Secondary | ICD-10-CM | POA: Diagnosis not present

## 2022-08-31 DIAGNOSIS — I739 Peripheral vascular disease, unspecified: Secondary | ICD-10-CM | POA: Diagnosis not present

## 2022-08-31 DIAGNOSIS — B351 Tinea unguium: Secondary | ICD-10-CM | POA: Diagnosis not present

## 2022-08-31 DIAGNOSIS — L84 Corns and callosities: Secondary | ICD-10-CM | POA: Diagnosis not present

## 2022-08-31 DIAGNOSIS — M21961 Unspecified acquired deformity of right lower leg: Secondary | ICD-10-CM | POA: Diagnosis not present

## 2022-08-31 DIAGNOSIS — E1142 Type 2 diabetes mellitus with diabetic polyneuropathy: Secondary | ICD-10-CM | POA: Diagnosis not present

## 2022-09-01 DIAGNOSIS — G5793 Unspecified mononeuropathy of bilateral lower limbs: Secondary | ICD-10-CM | POA: Diagnosis not present

## 2022-09-01 DIAGNOSIS — E1169 Type 2 diabetes mellitus with other specified complication: Secondary | ICD-10-CM | POA: Diagnosis not present

## 2022-09-01 DIAGNOSIS — E1165 Type 2 diabetes mellitus with hyperglycemia: Secondary | ICD-10-CM | POA: Diagnosis not present

## 2022-09-01 DIAGNOSIS — E039 Hypothyroidism, unspecified: Secondary | ICD-10-CM | POA: Diagnosis not present

## 2022-09-01 DIAGNOSIS — E785 Hyperlipidemia, unspecified: Secondary | ICD-10-CM | POA: Diagnosis not present

## 2022-09-08 DIAGNOSIS — M47816 Spondylosis without myelopathy or radiculopathy, lumbar region: Secondary | ICD-10-CM | POA: Diagnosis not present

## 2022-09-08 DIAGNOSIS — G894 Chronic pain syndrome: Secondary | ICD-10-CM | POA: Diagnosis not present

## 2022-09-08 DIAGNOSIS — Z6828 Body mass index (BMI) 28.0-28.9, adult: Secondary | ICD-10-CM | POA: Diagnosis not present

## 2022-10-11 DIAGNOSIS — Z125 Encounter for screening for malignant neoplasm of prostate: Secondary | ICD-10-CM | POA: Diagnosis not present

## 2022-10-11 DIAGNOSIS — E1169 Type 2 diabetes mellitus with other specified complication: Secondary | ICD-10-CM | POA: Diagnosis not present

## 2022-10-11 DIAGNOSIS — E538 Deficiency of other specified B group vitamins: Secondary | ICD-10-CM | POA: Diagnosis not present

## 2022-10-11 DIAGNOSIS — E1165 Type 2 diabetes mellitus with hyperglycemia: Secondary | ICD-10-CM | POA: Diagnosis not present

## 2022-10-11 DIAGNOSIS — E7800 Pure hypercholesterolemia, unspecified: Secondary | ICD-10-CM | POA: Diagnosis not present

## 2022-10-11 DIAGNOSIS — E039 Hypothyroidism, unspecified: Secondary | ICD-10-CM | POA: Diagnosis not present

## 2022-10-12 DIAGNOSIS — Z8639 Personal history of other endocrine, nutritional and metabolic disease: Secondary | ICD-10-CM | POA: Diagnosis not present

## 2022-10-12 DIAGNOSIS — R0602 Shortness of breath: Secondary | ICD-10-CM | POA: Diagnosis not present

## 2022-10-12 DIAGNOSIS — G8929 Other chronic pain: Secondary | ICD-10-CM | POA: Diagnosis not present

## 2022-10-12 DIAGNOSIS — M519 Unspecified thoracic, thoracolumbar and lumbosacral intervertebral disc disorder: Secondary | ICD-10-CM | POA: Diagnosis not present

## 2022-10-12 DIAGNOSIS — F3342 Major depressive disorder, recurrent, in full remission: Secondary | ICD-10-CM | POA: Diagnosis not present

## 2022-10-12 DIAGNOSIS — M8588 Other specified disorders of bone density and structure, other site: Secondary | ICD-10-CM | POA: Diagnosis not present

## 2022-10-12 DIAGNOSIS — G479 Sleep disorder, unspecified: Secondary | ICD-10-CM | POA: Diagnosis not present

## 2022-10-12 DIAGNOSIS — E785 Hyperlipidemia, unspecified: Secondary | ICD-10-CM | POA: Diagnosis not present

## 2022-10-12 DIAGNOSIS — E1169 Type 2 diabetes mellitus with other specified complication: Secondary | ICD-10-CM | POA: Diagnosis not present

## 2022-10-12 DIAGNOSIS — Z23 Encounter for immunization: Secondary | ICD-10-CM | POA: Diagnosis not present

## 2022-10-12 DIAGNOSIS — G4733 Obstructive sleep apnea (adult) (pediatric): Secondary | ICD-10-CM | POA: Diagnosis not present

## 2022-10-12 DIAGNOSIS — E039 Hypothyroidism, unspecified: Secondary | ICD-10-CM | POA: Diagnosis not present

## 2022-11-02 DIAGNOSIS — M21961 Unspecified acquired deformity of right lower leg: Secondary | ICD-10-CM | POA: Diagnosis not present

## 2022-11-02 DIAGNOSIS — B351 Tinea unguium: Secondary | ICD-10-CM | POA: Diagnosis not present

## 2022-11-02 DIAGNOSIS — I739 Peripheral vascular disease, unspecified: Secondary | ICD-10-CM | POA: Diagnosis not present

## 2022-11-02 DIAGNOSIS — L602 Onychogryphosis: Secondary | ICD-10-CM | POA: Diagnosis not present

## 2022-11-02 DIAGNOSIS — E1142 Type 2 diabetes mellitus with diabetic polyneuropathy: Secondary | ICD-10-CM | POA: Diagnosis not present

## 2022-11-02 DIAGNOSIS — M2022 Hallux rigidus, left foot: Secondary | ICD-10-CM | POA: Diagnosis not present

## 2022-11-02 DIAGNOSIS — L84 Corns and callosities: Secondary | ICD-10-CM | POA: Diagnosis not present

## 2022-11-04 DIAGNOSIS — M25511 Pain in right shoulder: Secondary | ICD-10-CM | POA: Diagnosis not present

## 2022-11-10 DIAGNOSIS — G8929 Other chronic pain: Secondary | ICD-10-CM | POA: Diagnosis not present

## 2022-11-10 DIAGNOSIS — E1169 Type 2 diabetes mellitus with other specified complication: Secondary | ICD-10-CM | POA: Diagnosis not present

## 2022-11-10 DIAGNOSIS — E039 Hypothyroidism, unspecified: Secondary | ICD-10-CM | POA: Diagnosis not present

## 2022-11-10 DIAGNOSIS — F3342 Major depressive disorder, recurrent, in full remission: Secondary | ICD-10-CM | POA: Diagnosis not present

## 2022-11-10 DIAGNOSIS — E785 Hyperlipidemia, unspecified: Secondary | ICD-10-CM | POA: Diagnosis not present

## 2022-11-17 DIAGNOSIS — Z0000 Encounter for general adult medical examination without abnormal findings: Secondary | ICD-10-CM | POA: Diagnosis not present

## 2022-11-17 DIAGNOSIS — G4733 Obstructive sleep apnea (adult) (pediatric): Secondary | ICD-10-CM | POA: Diagnosis not present

## 2022-11-17 DIAGNOSIS — Z1389 Encounter for screening for other disorder: Secondary | ICD-10-CM | POA: Diagnosis not present

## 2022-11-18 DIAGNOSIS — M21961 Unspecified acquired deformity of right lower leg: Secondary | ICD-10-CM | POA: Diagnosis not present

## 2022-11-18 DIAGNOSIS — M2022 Hallux rigidus, left foot: Secondary | ICD-10-CM | POA: Diagnosis not present

## 2022-11-18 DIAGNOSIS — M898X9 Other specified disorders of bone, unspecified site: Secondary | ICD-10-CM | POA: Diagnosis not present

## 2022-11-18 DIAGNOSIS — E1142 Type 2 diabetes mellitus with diabetic polyneuropathy: Secondary | ICD-10-CM | POA: Diagnosis not present

## 2022-11-26 DIAGNOSIS — M6281 Muscle weakness (generalized): Secondary | ICD-10-CM | POA: Diagnosis not present

## 2022-11-26 DIAGNOSIS — S46011D Strain of muscle(s) and tendon(s) of the rotator cuff of right shoulder, subsequent encounter: Secondary | ICD-10-CM | POA: Diagnosis not present

## 2022-11-29 DIAGNOSIS — M6281 Muscle weakness (generalized): Secondary | ICD-10-CM | POA: Diagnosis not present

## 2022-11-29 DIAGNOSIS — S46011D Strain of muscle(s) and tendon(s) of the rotator cuff of right shoulder, subsequent encounter: Secondary | ICD-10-CM | POA: Diagnosis not present

## 2022-12-02 DIAGNOSIS — S46011D Strain of muscle(s) and tendon(s) of the rotator cuff of right shoulder, subsequent encounter: Secondary | ICD-10-CM | POA: Diagnosis not present

## 2022-12-02 DIAGNOSIS — M6281 Muscle weakness (generalized): Secondary | ICD-10-CM | POA: Diagnosis not present

## 2022-12-07 DIAGNOSIS — M6281 Muscle weakness (generalized): Secondary | ICD-10-CM | POA: Diagnosis not present

## 2022-12-07 DIAGNOSIS — S46011D Strain of muscle(s) and tendon(s) of the rotator cuff of right shoulder, subsequent encounter: Secondary | ICD-10-CM | POA: Diagnosis not present

## 2022-12-08 DIAGNOSIS — G894 Chronic pain syndrome: Secondary | ICD-10-CM | POA: Diagnosis not present

## 2022-12-08 DIAGNOSIS — M47816 Spondylosis without myelopathy or radiculopathy, lumbar region: Secondary | ICD-10-CM | POA: Diagnosis not present

## 2022-12-08 DIAGNOSIS — M5416 Radiculopathy, lumbar region: Secondary | ICD-10-CM | POA: Diagnosis not present

## 2022-12-10 DIAGNOSIS — M6281 Muscle weakness (generalized): Secondary | ICD-10-CM | POA: Diagnosis not present

## 2022-12-10 DIAGNOSIS — S46011D Strain of muscle(s) and tendon(s) of the rotator cuff of right shoulder, subsequent encounter: Secondary | ICD-10-CM | POA: Diagnosis not present

## 2022-12-14 DIAGNOSIS — S46011D Strain of muscle(s) and tendon(s) of the rotator cuff of right shoulder, subsequent encounter: Secondary | ICD-10-CM | POA: Diagnosis not present

## 2022-12-14 DIAGNOSIS — M6281 Muscle weakness (generalized): Secondary | ICD-10-CM | POA: Diagnosis not present

## 2022-12-15 DIAGNOSIS — M5416 Radiculopathy, lumbar region: Secondary | ICD-10-CM | POA: Diagnosis not present

## 2022-12-16 DIAGNOSIS — M6281 Muscle weakness (generalized): Secondary | ICD-10-CM | POA: Diagnosis not present

## 2022-12-16 DIAGNOSIS — S46011D Strain of muscle(s) and tendon(s) of the rotator cuff of right shoulder, subsequent encounter: Secondary | ICD-10-CM | POA: Diagnosis not present

## 2022-12-21 DIAGNOSIS — S46011D Strain of muscle(s) and tendon(s) of the rotator cuff of right shoulder, subsequent encounter: Secondary | ICD-10-CM | POA: Diagnosis not present

## 2022-12-21 DIAGNOSIS — M6281 Muscle weakness (generalized): Secondary | ICD-10-CM | POA: Diagnosis not present

## 2022-12-23 DIAGNOSIS — M6281 Muscle weakness (generalized): Secondary | ICD-10-CM | POA: Diagnosis not present

## 2022-12-23 DIAGNOSIS — S46011D Strain of muscle(s) and tendon(s) of the rotator cuff of right shoulder, subsequent encounter: Secondary | ICD-10-CM | POA: Diagnosis not present

## 2022-12-29 DIAGNOSIS — M6281 Muscle weakness (generalized): Secondary | ICD-10-CM | POA: Diagnosis not present

## 2022-12-29 DIAGNOSIS — S46011D Strain of muscle(s) and tendon(s) of the rotator cuff of right shoulder, subsequent encounter: Secondary | ICD-10-CM | POA: Diagnosis not present

## 2022-12-30 DIAGNOSIS — E1165 Type 2 diabetes mellitus with hyperglycemia: Secondary | ICD-10-CM | POA: Diagnosis not present

## 2022-12-30 DIAGNOSIS — E785 Hyperlipidemia, unspecified: Secondary | ICD-10-CM | POA: Diagnosis not present

## 2022-12-30 DIAGNOSIS — E039 Hypothyroidism, unspecified: Secondary | ICD-10-CM | POA: Diagnosis not present

## 2023-01-03 DIAGNOSIS — E785 Hyperlipidemia, unspecified: Secondary | ICD-10-CM | POA: Diagnosis not present

## 2023-01-03 DIAGNOSIS — G5793 Unspecified mononeuropathy of bilateral lower limbs: Secondary | ICD-10-CM | POA: Diagnosis not present

## 2023-01-03 DIAGNOSIS — E039 Hypothyroidism, unspecified: Secondary | ICD-10-CM | POA: Diagnosis not present

## 2023-01-03 DIAGNOSIS — E1165 Type 2 diabetes mellitus with hyperglycemia: Secondary | ICD-10-CM | POA: Diagnosis not present

## 2023-01-03 DIAGNOSIS — G479 Sleep disorder, unspecified: Secondary | ICD-10-CM | POA: Diagnosis not present

## 2023-01-03 DIAGNOSIS — J310 Chronic rhinitis: Secondary | ICD-10-CM | POA: Diagnosis not present

## 2023-01-06 DIAGNOSIS — L602 Onychogryphosis: Secondary | ICD-10-CM | POA: Diagnosis not present

## 2023-01-06 DIAGNOSIS — I739 Peripheral vascular disease, unspecified: Secondary | ICD-10-CM | POA: Diagnosis not present

## 2023-01-06 DIAGNOSIS — M6281 Muscle weakness (generalized): Secondary | ICD-10-CM | POA: Diagnosis not present

## 2023-01-06 DIAGNOSIS — L84 Corns and callosities: Secondary | ICD-10-CM | POA: Diagnosis not present

## 2023-01-06 DIAGNOSIS — I872 Venous insufficiency (chronic) (peripheral): Secondary | ICD-10-CM | POA: Diagnosis not present

## 2023-01-06 DIAGNOSIS — E1142 Type 2 diabetes mellitus with diabetic polyneuropathy: Secondary | ICD-10-CM | POA: Diagnosis not present

## 2023-01-06 DIAGNOSIS — B351 Tinea unguium: Secondary | ICD-10-CM | POA: Diagnosis not present

## 2023-01-06 DIAGNOSIS — M2022 Hallux rigidus, left foot: Secondary | ICD-10-CM | POA: Diagnosis not present

## 2023-01-06 DIAGNOSIS — S46011D Strain of muscle(s) and tendon(s) of the rotator cuff of right shoulder, subsequent encounter: Secondary | ICD-10-CM | POA: Diagnosis not present

## 2023-01-06 DIAGNOSIS — M21961 Unspecified acquired deformity of right lower leg: Secondary | ICD-10-CM | POA: Diagnosis not present

## 2023-01-11 DIAGNOSIS — S46011D Strain of muscle(s) and tendon(s) of the rotator cuff of right shoulder, subsequent encounter: Secondary | ICD-10-CM | POA: Diagnosis not present

## 2023-01-11 DIAGNOSIS — M6281 Muscle weakness (generalized): Secondary | ICD-10-CM | POA: Diagnosis not present

## 2023-01-13 DIAGNOSIS — S46011D Strain of muscle(s) and tendon(s) of the rotator cuff of right shoulder, subsequent encounter: Secondary | ICD-10-CM | POA: Diagnosis not present

## 2023-01-13 DIAGNOSIS — M6281 Muscle weakness (generalized): Secondary | ICD-10-CM | POA: Diagnosis not present

## 2023-01-18 DIAGNOSIS — M6281 Muscle weakness (generalized): Secondary | ICD-10-CM | POA: Diagnosis not present

## 2023-01-18 DIAGNOSIS — S46011D Strain of muscle(s) and tendon(s) of the rotator cuff of right shoulder, subsequent encounter: Secondary | ICD-10-CM | POA: Diagnosis not present

## 2023-02-01 DIAGNOSIS — S46011D Strain of muscle(s) and tendon(s) of the rotator cuff of right shoulder, subsequent encounter: Secondary | ICD-10-CM | POA: Diagnosis not present

## 2023-02-01 DIAGNOSIS — M6281 Muscle weakness (generalized): Secondary | ICD-10-CM | POA: Diagnosis not present

## 2023-02-03 DIAGNOSIS — S46011D Strain of muscle(s) and tendon(s) of the rotator cuff of right shoulder, subsequent encounter: Secondary | ICD-10-CM | POA: Diagnosis not present

## 2023-02-03 DIAGNOSIS — M6281 Muscle weakness (generalized): Secondary | ICD-10-CM | POA: Diagnosis not present

## 2023-02-08 DIAGNOSIS — S46011D Strain of muscle(s) and tendon(s) of the rotator cuff of right shoulder, subsequent encounter: Secondary | ICD-10-CM | POA: Diagnosis not present

## 2023-02-08 DIAGNOSIS — M6281 Muscle weakness (generalized): Secondary | ICD-10-CM | POA: Diagnosis not present

## 2023-02-10 DIAGNOSIS — M47816 Spondylosis without myelopathy or radiculopathy, lumbar region: Secondary | ICD-10-CM | POA: Diagnosis not present

## 2023-02-15 DIAGNOSIS — M6281 Muscle weakness (generalized): Secondary | ICD-10-CM | POA: Diagnosis not present

## 2023-02-15 DIAGNOSIS — S46011D Strain of muscle(s) and tendon(s) of the rotator cuff of right shoulder, subsequent encounter: Secondary | ICD-10-CM | POA: Diagnosis not present

## 2023-02-15 DIAGNOSIS — J302 Other seasonal allergic rhinitis: Secondary | ICD-10-CM | POA: Diagnosis not present

## 2023-02-15 DIAGNOSIS — J029 Acute pharyngitis, unspecified: Secondary | ICD-10-CM | POA: Diagnosis not present

## 2023-02-17 DIAGNOSIS — M5451 Vertebrogenic low back pain: Secondary | ICD-10-CM | POA: Diagnosis not present

## 2023-02-17 DIAGNOSIS — R531 Weakness: Secondary | ICD-10-CM | POA: Diagnosis not present

## 2023-02-17 DIAGNOSIS — M25652 Stiffness of left hip, not elsewhere classified: Secondary | ICD-10-CM | POA: Diagnosis not present

## 2023-02-17 DIAGNOSIS — R262 Difficulty in walking, not elsewhere classified: Secondary | ICD-10-CM | POA: Diagnosis not present

## 2023-02-21 DIAGNOSIS — R262 Difficulty in walking, not elsewhere classified: Secondary | ICD-10-CM | POA: Diagnosis not present

## 2023-02-21 DIAGNOSIS — M25652 Stiffness of left hip, not elsewhere classified: Secondary | ICD-10-CM | POA: Diagnosis not present

## 2023-02-21 DIAGNOSIS — M5451 Vertebrogenic low back pain: Secondary | ICD-10-CM | POA: Diagnosis not present

## 2023-02-21 DIAGNOSIS — R531 Weakness: Secondary | ICD-10-CM | POA: Diagnosis not present

## 2023-02-23 DIAGNOSIS — R262 Difficulty in walking, not elsewhere classified: Secondary | ICD-10-CM | POA: Diagnosis not present

## 2023-02-23 DIAGNOSIS — M5451 Vertebrogenic low back pain: Secondary | ICD-10-CM | POA: Diagnosis not present

## 2023-02-23 DIAGNOSIS — R531 Weakness: Secondary | ICD-10-CM | POA: Diagnosis not present

## 2023-02-23 DIAGNOSIS — M25652 Stiffness of left hip, not elsewhere classified: Secondary | ICD-10-CM | POA: Diagnosis not present

## 2023-03-02 DIAGNOSIS — M5451 Vertebrogenic low back pain: Secondary | ICD-10-CM | POA: Diagnosis not present

## 2023-03-02 DIAGNOSIS — R262 Difficulty in walking, not elsewhere classified: Secondary | ICD-10-CM | POA: Diagnosis not present

## 2023-03-02 DIAGNOSIS — M25652 Stiffness of left hip, not elsewhere classified: Secondary | ICD-10-CM | POA: Diagnosis not present

## 2023-03-02 DIAGNOSIS — R531 Weakness: Secondary | ICD-10-CM | POA: Diagnosis not present

## 2023-03-07 DIAGNOSIS — M5451 Vertebrogenic low back pain: Secondary | ICD-10-CM | POA: Diagnosis not present

## 2023-03-07 DIAGNOSIS — R531 Weakness: Secondary | ICD-10-CM | POA: Diagnosis not present

## 2023-03-07 DIAGNOSIS — C44222 Squamous cell carcinoma of skin of right ear and external auricular canal: Secondary | ICD-10-CM | POA: Diagnosis not present

## 2023-03-07 DIAGNOSIS — D485 Neoplasm of uncertain behavior of skin: Secondary | ICD-10-CM | POA: Diagnosis not present

## 2023-03-07 DIAGNOSIS — R262 Difficulty in walking, not elsewhere classified: Secondary | ICD-10-CM | POA: Diagnosis not present

## 2023-03-07 DIAGNOSIS — L111 Transient acantholytic dermatosis [Grover]: Secondary | ICD-10-CM | POA: Diagnosis not present

## 2023-03-07 DIAGNOSIS — L820 Inflamed seborrheic keratosis: Secondary | ICD-10-CM | POA: Diagnosis not present

## 2023-03-07 DIAGNOSIS — M25652 Stiffness of left hip, not elsewhere classified: Secondary | ICD-10-CM | POA: Diagnosis not present

## 2023-03-07 DIAGNOSIS — L578 Other skin changes due to chronic exposure to nonionizing radiation: Secondary | ICD-10-CM | POA: Diagnosis not present

## 2023-03-07 DIAGNOSIS — L918 Other hypertrophic disorders of the skin: Secondary | ICD-10-CM | POA: Diagnosis not present

## 2023-03-07 DIAGNOSIS — Z85828 Personal history of other malignant neoplasm of skin: Secondary | ICD-10-CM | POA: Diagnosis not present

## 2023-03-08 DIAGNOSIS — L84 Corns and callosities: Secondary | ICD-10-CM | POA: Diagnosis not present

## 2023-03-08 DIAGNOSIS — B351 Tinea unguium: Secondary | ICD-10-CM | POA: Diagnosis not present

## 2023-03-08 DIAGNOSIS — I739 Peripheral vascular disease, unspecified: Secondary | ICD-10-CM | POA: Diagnosis not present

## 2023-03-08 DIAGNOSIS — E1142 Type 2 diabetes mellitus with diabetic polyneuropathy: Secondary | ICD-10-CM | POA: Diagnosis not present

## 2023-03-09 DIAGNOSIS — M25652 Stiffness of left hip, not elsewhere classified: Secondary | ICD-10-CM | POA: Diagnosis not present

## 2023-03-09 DIAGNOSIS — M5451 Vertebrogenic low back pain: Secondary | ICD-10-CM | POA: Diagnosis not present

## 2023-03-09 DIAGNOSIS — R531 Weakness: Secondary | ICD-10-CM | POA: Diagnosis not present

## 2023-03-09 DIAGNOSIS — R262 Difficulty in walking, not elsewhere classified: Secondary | ICD-10-CM | POA: Diagnosis not present

## 2023-03-16 DIAGNOSIS — M25652 Stiffness of left hip, not elsewhere classified: Secondary | ICD-10-CM | POA: Diagnosis not present

## 2023-03-16 DIAGNOSIS — R531 Weakness: Secondary | ICD-10-CM | POA: Diagnosis not present

## 2023-03-16 DIAGNOSIS — R262 Difficulty in walking, not elsewhere classified: Secondary | ICD-10-CM | POA: Diagnosis not present

## 2023-03-16 DIAGNOSIS — M5451 Vertebrogenic low back pain: Secondary | ICD-10-CM | POA: Diagnosis not present

## 2023-03-21 DIAGNOSIS — R262 Difficulty in walking, not elsewhere classified: Secondary | ICD-10-CM | POA: Diagnosis not present

## 2023-03-21 DIAGNOSIS — R531 Weakness: Secondary | ICD-10-CM | POA: Diagnosis not present

## 2023-03-21 DIAGNOSIS — M25652 Stiffness of left hip, not elsewhere classified: Secondary | ICD-10-CM | POA: Diagnosis not present

## 2023-03-23 DIAGNOSIS — R531 Weakness: Secondary | ICD-10-CM | POA: Diagnosis not present

## 2023-03-23 DIAGNOSIS — M5451 Vertebrogenic low back pain: Secondary | ICD-10-CM | POA: Diagnosis not present

## 2023-03-23 DIAGNOSIS — R262 Difficulty in walking, not elsewhere classified: Secondary | ICD-10-CM | POA: Diagnosis not present

## 2023-03-23 DIAGNOSIS — M25652 Stiffness of left hip, not elsewhere classified: Secondary | ICD-10-CM | POA: Diagnosis not present

## 2023-03-29 DIAGNOSIS — C44222 Squamous cell carcinoma of skin of right ear and external auricular canal: Secondary | ICD-10-CM | POA: Diagnosis not present

## 2023-03-29 DIAGNOSIS — Z85828 Personal history of other malignant neoplasm of skin: Secondary | ICD-10-CM | POA: Diagnosis not present

## 2023-04-04 DIAGNOSIS — E785 Hyperlipidemia, unspecified: Secondary | ICD-10-CM | POA: Diagnosis not present

## 2023-04-04 DIAGNOSIS — E1165 Type 2 diabetes mellitus with hyperglycemia: Secondary | ICD-10-CM | POA: Diagnosis not present

## 2023-04-04 DIAGNOSIS — E039 Hypothyroidism, unspecified: Secondary | ICD-10-CM | POA: Diagnosis not present

## 2023-04-06 DIAGNOSIS — M25652 Stiffness of left hip, not elsewhere classified: Secondary | ICD-10-CM | POA: Diagnosis not present

## 2023-04-06 DIAGNOSIS — R262 Difficulty in walking, not elsewhere classified: Secondary | ICD-10-CM | POA: Diagnosis not present

## 2023-04-06 DIAGNOSIS — R531 Weakness: Secondary | ICD-10-CM | POA: Diagnosis not present

## 2023-04-06 DIAGNOSIS — M5451 Vertebrogenic low back pain: Secondary | ICD-10-CM | POA: Diagnosis not present

## 2023-04-12 DIAGNOSIS — E1142 Type 2 diabetes mellitus with diabetic polyneuropathy: Secondary | ICD-10-CM | POA: Diagnosis not present

## 2023-04-12 DIAGNOSIS — L609 Nail disorder, unspecified: Secondary | ICD-10-CM | POA: Diagnosis not present

## 2023-04-12 DIAGNOSIS — I739 Peripheral vascular disease, unspecified: Secondary | ICD-10-CM | POA: Diagnosis not present

## 2023-04-12 DIAGNOSIS — M2022 Hallux rigidus, left foot: Secondary | ICD-10-CM | POA: Diagnosis not present

## 2023-04-12 DIAGNOSIS — M21961 Unspecified acquired deformity of right lower leg: Secondary | ICD-10-CM | POA: Diagnosis not present

## 2023-04-12 DIAGNOSIS — Z4802 Encounter for removal of sutures: Secondary | ICD-10-CM | POA: Diagnosis not present

## 2023-04-12 DIAGNOSIS — L570 Actinic keratosis: Secondary | ICD-10-CM | POA: Diagnosis not present

## 2023-04-13 DIAGNOSIS — Z23 Encounter for immunization: Secondary | ICD-10-CM | POA: Diagnosis not present

## 2023-04-13 DIAGNOSIS — Z6825 Body mass index (BMI) 25.0-25.9, adult: Secondary | ICD-10-CM | POA: Diagnosis not present

## 2023-04-13 DIAGNOSIS — Z8639 Personal history of other endocrine, nutritional and metabolic disease: Secondary | ICD-10-CM | POA: Diagnosis not present

## 2023-04-13 DIAGNOSIS — M8588 Other specified disorders of bone density and structure, other site: Secondary | ICD-10-CM | POA: Diagnosis not present

## 2023-04-13 DIAGNOSIS — E1169 Type 2 diabetes mellitus with other specified complication: Secondary | ICD-10-CM | POA: Diagnosis not present

## 2023-04-13 DIAGNOSIS — F3342 Major depressive disorder, recurrent, in full remission: Secondary | ICD-10-CM | POA: Diagnosis not present

## 2023-04-13 DIAGNOSIS — C44222 Squamous cell carcinoma of skin of right ear and external auricular canal: Secondary | ICD-10-CM | POA: Diagnosis not present

## 2023-04-13 DIAGNOSIS — Z79899 Other long term (current) drug therapy: Secondary | ICD-10-CM | POA: Diagnosis not present

## 2023-04-13 DIAGNOSIS — E785 Hyperlipidemia, unspecified: Secondary | ICD-10-CM | POA: Diagnosis not present

## 2023-04-13 DIAGNOSIS — E039 Hypothyroidism, unspecified: Secondary | ICD-10-CM | POA: Diagnosis not present

## 2023-04-13 DIAGNOSIS — G8929 Other chronic pain: Secondary | ICD-10-CM | POA: Diagnosis not present

## 2023-04-13 DIAGNOSIS — G479 Sleep disorder, unspecified: Secondary | ICD-10-CM | POA: Diagnosis not present

## 2023-04-19 DIAGNOSIS — G4733 Obstructive sleep apnea (adult) (pediatric): Secondary | ICD-10-CM | POA: Diagnosis not present

## 2023-04-21 DIAGNOSIS — L601 Onycholysis: Secondary | ICD-10-CM | POA: Diagnosis not present

## 2023-04-27 DIAGNOSIS — E119 Type 2 diabetes mellitus without complications: Secondary | ICD-10-CM | POA: Diagnosis not present

## 2023-04-27 DIAGNOSIS — M47816 Spondylosis without myelopathy or radiculopathy, lumbar region: Secondary | ICD-10-CM | POA: Diagnosis not present

## 2023-05-02 DIAGNOSIS — E538 Deficiency of other specified B group vitamins: Secondary | ICD-10-CM | POA: Diagnosis not present

## 2023-05-09 DIAGNOSIS — E538 Deficiency of other specified B group vitamins: Secondary | ICD-10-CM | POA: Diagnosis not present

## 2023-05-16 DIAGNOSIS — E538 Deficiency of other specified B group vitamins: Secondary | ICD-10-CM | POA: Diagnosis not present

## 2023-05-24 DIAGNOSIS — L602 Onychogryphosis: Secondary | ICD-10-CM | POA: Diagnosis not present

## 2023-05-24 DIAGNOSIS — I739 Peripheral vascular disease, unspecified: Secondary | ICD-10-CM | POA: Diagnosis not present

## 2023-05-24 DIAGNOSIS — E1142 Type 2 diabetes mellitus with diabetic polyneuropathy: Secondary | ICD-10-CM | POA: Diagnosis not present

## 2023-05-24 DIAGNOSIS — B351 Tinea unguium: Secondary | ICD-10-CM | POA: Diagnosis not present

## 2023-05-30 DIAGNOSIS — E785 Hyperlipidemia, unspecified: Secondary | ICD-10-CM | POA: Diagnosis not present

## 2023-05-30 DIAGNOSIS — E538 Deficiency of other specified B group vitamins: Secondary | ICD-10-CM | POA: Diagnosis not present

## 2023-05-30 DIAGNOSIS — M47816 Spondylosis without myelopathy or radiculopathy, lumbar region: Secondary | ICD-10-CM | POA: Diagnosis not present

## 2023-05-30 DIAGNOSIS — R7989 Other specified abnormal findings of blood chemistry: Secondary | ICD-10-CM | POA: Diagnosis not present

## 2023-05-31 DIAGNOSIS — Z85828 Personal history of other malignant neoplasm of skin: Secondary | ICD-10-CM | POA: Diagnosis not present

## 2023-05-31 DIAGNOSIS — L570 Actinic keratosis: Secondary | ICD-10-CM | POA: Diagnosis not present

## 2023-05-31 DIAGNOSIS — L111 Transient acantholytic dermatosis [Grover]: Secondary | ICD-10-CM | POA: Diagnosis not present

## 2023-05-31 DIAGNOSIS — L820 Inflamed seborrheic keratosis: Secondary | ICD-10-CM | POA: Diagnosis not present

## 2023-06-08 DIAGNOSIS — E538 Deficiency of other specified B group vitamins: Secondary | ICD-10-CM | POA: Diagnosis not present

## 2023-06-13 DIAGNOSIS — M47816 Spondylosis without myelopathy or radiculopathy, lumbar region: Secondary | ICD-10-CM | POA: Diagnosis not present

## 2023-06-15 DIAGNOSIS — E538 Deficiency of other specified B group vitamins: Secondary | ICD-10-CM | POA: Diagnosis not present

## 2023-06-22 DIAGNOSIS — Z85828 Personal history of other malignant neoplasm of skin: Secondary | ICD-10-CM | POA: Diagnosis not present

## 2023-06-22 DIAGNOSIS — D485 Neoplasm of uncertain behavior of skin: Secondary | ICD-10-CM | POA: Diagnosis not present

## 2023-06-22 DIAGNOSIS — C44529 Squamous cell carcinoma of skin of other part of trunk: Secondary | ICD-10-CM | POA: Diagnosis not present

## 2023-06-23 DIAGNOSIS — E538 Deficiency of other specified B group vitamins: Secondary | ICD-10-CM | POA: Diagnosis not present

## 2023-06-29 DIAGNOSIS — E538 Deficiency of other specified B group vitamins: Secondary | ICD-10-CM | POA: Diagnosis not present

## 2023-07-07 DIAGNOSIS — R7989 Other specified abnormal findings of blood chemistry: Secondary | ICD-10-CM | POA: Diagnosis not present

## 2023-07-13 DIAGNOSIS — E538 Deficiency of other specified B group vitamins: Secondary | ICD-10-CM | POA: Diagnosis not present

## 2023-07-18 DIAGNOSIS — Z6825 Body mass index (BMI) 25.0-25.9, adult: Secondary | ICD-10-CM | POA: Diagnosis not present

## 2023-07-18 DIAGNOSIS — R238 Other skin changes: Secondary | ICD-10-CM | POA: Diagnosis not present

## 2023-07-19 DIAGNOSIS — E538 Deficiency of other specified B group vitamins: Secondary | ICD-10-CM | POA: Diagnosis not present

## 2023-07-27 DIAGNOSIS — L111 Transient acantholytic dermatosis [Grover]: Secondary | ICD-10-CM | POA: Diagnosis not present

## 2023-07-27 DIAGNOSIS — E785 Hyperlipidemia, unspecified: Secondary | ICD-10-CM | POA: Diagnosis not present

## 2023-07-27 DIAGNOSIS — Z6825 Body mass index (BMI) 25.0-25.9, adult: Secondary | ICD-10-CM | POA: Diagnosis not present

## 2023-07-27 DIAGNOSIS — E538 Deficiency of other specified B group vitamins: Secondary | ICD-10-CM | POA: Diagnosis not present

## 2023-08-01 DIAGNOSIS — E1142 Type 2 diabetes mellitus with diabetic polyneuropathy: Secondary | ICD-10-CM | POA: Diagnosis not present

## 2023-08-01 DIAGNOSIS — I739 Peripheral vascular disease, unspecified: Secondary | ICD-10-CM | POA: Diagnosis not present

## 2023-08-01 DIAGNOSIS — B351 Tinea unguium: Secondary | ICD-10-CM | POA: Diagnosis not present

## 2023-08-03 ENCOUNTER — Other Ambulatory Visit (HOSPITAL_COMMUNITY): Payer: Self-pay | Admitting: Pain Medicine

## 2023-08-03 DIAGNOSIS — E7800 Pure hypercholesterolemia, unspecified: Secondary | ICD-10-CM

## 2023-08-03 DIAGNOSIS — Z6825 Body mass index (BMI) 25.0-25.9, adult: Secondary | ICD-10-CM | POA: Diagnosis not present

## 2023-08-03 DIAGNOSIS — E538 Deficiency of other specified B group vitamins: Secondary | ICD-10-CM | POA: Diagnosis not present

## 2023-08-03 DIAGNOSIS — E785 Hyperlipidemia, unspecified: Secondary | ICD-10-CM | POA: Diagnosis not present

## 2023-08-08 DIAGNOSIS — Z85828 Personal history of other malignant neoplasm of skin: Secondary | ICD-10-CM | POA: Diagnosis not present

## 2023-08-08 DIAGNOSIS — L821 Other seborrheic keratosis: Secondary | ICD-10-CM | POA: Diagnosis not present

## 2023-08-08 DIAGNOSIS — L814 Other melanin hyperpigmentation: Secondary | ICD-10-CM | POA: Diagnosis not present

## 2023-08-08 DIAGNOSIS — L570 Actinic keratosis: Secondary | ICD-10-CM | POA: Diagnosis not present

## 2023-08-08 DIAGNOSIS — R21 Rash and other nonspecific skin eruption: Secondary | ICD-10-CM | POA: Diagnosis not present

## 2023-08-08 DIAGNOSIS — L111 Transient acantholytic dermatosis [Grover]: Secondary | ICD-10-CM | POA: Diagnosis not present

## 2023-08-08 DIAGNOSIS — L858 Other specified epidermal thickening: Secondary | ICD-10-CM | POA: Diagnosis not present

## 2023-08-08 DIAGNOSIS — D2262 Melanocytic nevi of left upper limb, including shoulder: Secondary | ICD-10-CM | POA: Diagnosis not present

## 2023-08-08 DIAGNOSIS — D1801 Hemangioma of skin and subcutaneous tissue: Secondary | ICD-10-CM | POA: Diagnosis not present

## 2023-08-09 ENCOUNTER — Ambulatory Visit (HOSPITAL_COMMUNITY)
Admission: RE | Admit: 2023-08-09 | Discharge: 2023-08-09 | Disposition: A | Discharge status: Home / Self Care | Payer: Medicare Other | Source: Ambulatory Visit | Attending: Pain Medicine | Admitting: Pain Medicine

## 2023-08-09 DIAGNOSIS — E7800 Pure hypercholesterolemia, unspecified: Secondary | ICD-10-CM | POA: Insufficient documentation

## 2023-08-16 DIAGNOSIS — M47816 Spondylosis without myelopathy or radiculopathy, lumbar region: Secondary | ICD-10-CM | POA: Diagnosis not present

## 2023-09-20 DIAGNOSIS — E039 Hypothyroidism, unspecified: Secondary | ICD-10-CM | POA: Diagnosis not present

## 2023-09-20 DIAGNOSIS — E1165 Type 2 diabetes mellitus with hyperglycemia: Secondary | ICD-10-CM | POA: Diagnosis not present

## 2023-09-28 DIAGNOSIS — E1165 Type 2 diabetes mellitus with hyperglycemia: Secondary | ICD-10-CM | POA: Diagnosis not present

## 2023-09-28 DIAGNOSIS — E039 Hypothyroidism, unspecified: Secondary | ICD-10-CM | POA: Diagnosis not present

## 2023-09-28 DIAGNOSIS — G5793 Unspecified mononeuropathy of bilateral lower limbs: Secondary | ICD-10-CM | POA: Diagnosis not present

## 2023-09-28 DIAGNOSIS — E785 Hyperlipidemia, unspecified: Secondary | ICD-10-CM | POA: Diagnosis not present

## 2023-10-03 DIAGNOSIS — E1169 Type 2 diabetes mellitus with other specified complication: Secondary | ICD-10-CM | POA: Diagnosis not present

## 2023-10-03 DIAGNOSIS — Z8639 Personal history of other endocrine, nutritional and metabolic disease: Secondary | ICD-10-CM | POA: Diagnosis not present

## 2023-10-03 DIAGNOSIS — M8588 Other specified disorders of bone density and structure, other site: Secondary | ICD-10-CM | POA: Diagnosis not present

## 2023-10-03 DIAGNOSIS — J302 Other seasonal allergic rhinitis: Secondary | ICD-10-CM | POA: Diagnosis not present

## 2023-10-03 DIAGNOSIS — Z79899 Other long term (current) drug therapy: Secondary | ICD-10-CM | POA: Diagnosis not present

## 2023-10-03 DIAGNOSIS — G479 Sleep disorder, unspecified: Secondary | ICD-10-CM | POA: Diagnosis not present

## 2023-10-03 DIAGNOSIS — E785 Hyperlipidemia, unspecified: Secondary | ICD-10-CM | POA: Diagnosis not present

## 2023-10-03 DIAGNOSIS — I2510 Atherosclerotic heart disease of native coronary artery without angina pectoris: Secondary | ICD-10-CM | POA: Diagnosis not present

## 2023-10-03 DIAGNOSIS — F3342 Major depressive disorder, recurrent, in full remission: Secondary | ICD-10-CM | POA: Diagnosis not present

## 2023-10-03 DIAGNOSIS — G8929 Other chronic pain: Secondary | ICD-10-CM | POA: Diagnosis not present

## 2023-10-03 DIAGNOSIS — Z23 Encounter for immunization: Secondary | ICD-10-CM | POA: Diagnosis not present

## 2023-10-03 DIAGNOSIS — E039 Hypothyroidism, unspecified: Secondary | ICD-10-CM | POA: Diagnosis not present

## 2023-10-14 ENCOUNTER — Ambulatory Visit: Payer: Medicare Other | Attending: Cardiology | Admitting: Cardiology

## 2023-10-14 ENCOUNTER — Encounter: Payer: Self-pay | Admitting: Cardiology

## 2023-10-14 VITALS — BP 130/70 | HR 66 | Ht 69.0 in | Wt 171.0 lb

## 2023-10-14 DIAGNOSIS — R072 Precordial pain: Secondary | ICD-10-CM | POA: Insufficient documentation

## 2023-10-14 DIAGNOSIS — E785 Hyperlipidemia, unspecified: Secondary | ICD-10-CM | POA: Insufficient documentation

## 2023-10-14 DIAGNOSIS — R931 Abnormal findings on diagnostic imaging of heart and coronary circulation: Secondary | ICD-10-CM | POA: Insufficient documentation

## 2023-10-14 NOTE — Patient Instructions (Addendum)
Medication Instructions:  Continue current medications *If you need a refill on your cardiac medications before your next appointment, please call your pharmacy*   Lab Work: NONE If you have labs (blood work) drawn today and your tests are completely normal, you will receive your results only by: MyChart Message (if you have MyChart) OR A paper copy in the mail If you have any lab test that is abnormal or we need to change your treatment, we will call you to review the results.   Testing/Procedures: ECHO  Your physician has requested that you have an echocardiogram. Echocardiography is a painless test that uses sound waves to create images of your heart. It provides your doctor with information about the size and shape of your heart and how well your heart's chambers and valves are working. This procedure takes approximately one hour. There are no restrictions for this procedure. Please do NOT wear cologne, perfume, aftershave, or lotions (deodorant is allowed). Please arrive 15 minutes prior to your appointment time.  Please note: We ask at that you not bring children with you during ultrasound (echo/ vascular) testing. Due to room size and safety concerns, children are not allowed in the ultrasound rooms during exams. Our front office staff cannot provide observation of children in our lobby area while testing is being conducted. An adult accompanying a patient to their appointment will only be allowed in the ultrasound room at the discretion of the ultrasound technician under special circumstances. We apologize for any inconvenience.    Follow-Up: At [facility], you and your health needs are our priority.  As part of our continuing mission to provide you with exceptional heart care, we have created designated Provider Care Teams.  These Care Teams include your primary Cardiologist (physician) and Advanced Practice Providers (APPs -  Physician Assistants and Nurse Practitioners)  who all work together to provide you with the care you need, when you need it.  We recommend signing up for the patient portal called "MyChart".  Sign up information is provided on this After Visit Summary.  MyChart is used to connect with patients for Virtual Visits (Telemedicine).  Patients are able to view lab/test results, encounter notes, upcoming appointments, etc.  Non-urgent messages can be sent to your provider as well.   To learn more about what you can do with MyChart, go to [email or web address].    Your next appointment:   6 month(s)  Provider:   Dr.Schumann  Other Instructions How to Prepare for Your Cardiac PET/CT Stress Test:  1. Please do not take these medications before your test:   Medications that may interfere with the cardiac pharmacological stress agent (ex. nitrates - including erectile dysfunction medications, isosorbide mononitrate- [please start to hold this medication the day before the test], tamulosin or beta-blockers) the day of the exam. (Erectile dysfunction medication should be held for at least 72 hrs prior to test) Your remaining medications may be taken with water.  2. Nothing to eat or drink, except water, 3 hours prior to arrival time.   NO caffeine/decaffeinated products, or chocolate 12 hours prior to arrival.  3. NO perfume, cologne or lotion on chest or abdomen area.         4. Total time is 1 to 2 hours; you may want to bring reading material for the waiting time.  5. Please report to Radiology at the [facility] Main Entrance 30 minutes early for your test.  [address] [place], [place] [address]  Diabetic Preparation:  Hold oral medications. You may take NPH and Lantus insulin. Do not take Humalog or Humulin R (Regular Insulin) the day of your test. Check blood sugars prior to leaving the house. If able to eat breakfast prior to 3 hour fasting, you may take all medications, including your insulin, Do not worry if  you miss your breakfast dose of insulin - start at your next meal. Patients who wear a continuous glucose monitor MUST remove the device prior to scanning.   In preparation for your appointment, medication and supplies will be purchased.  Appointment availability is limited, so if you need to cancel or reschedule, please call the Radiology Department at [phone] [facility])  24 hours in advance to avoid a cancellation fee of $100.00  What to Expect After you Arrive:  Once you arrive and check in for your appointment, you will be taken to a preparation room within the Radiology Department.  A technologist or Nurse will obtain your medical history, verify that you are correctly prepped for the exam, and explain the procedure.  Afterwards,  an IV will be started in your arm and electrodes will be placed on your skin for EKG monitoring during the stress portion of the exam. Then you will be escorted to the PET/CT scanner.  There, staff will get you positioned on the scanner and obtain a blood pressure and EKG.  During the exam, you will continue to be connected to the EKG and blood pressure machines.  A small, safe amount of a radioactive tracer will be injected in your IV to obtain a series of pictures of your heart along with an injection of a stress agent.    After your Exam:  It is recommended that you eat a meal and drink a caffeinated beverage to counter act any effects of the stress agent.  Drink plenty of fluids for the remainder of the day and urinate frequently for the first couple of hours after the exam.  Your doctor will inform you of your test results within 7-10 business days.  For more information and frequently asked questions, please visit our website : [email or web address]  For questions about your test or how to prepare for your test, please call: Cardiac Imaging Nurse Navigators Office: [phone]

## 2023-10-14 NOTE — Progress Notes (Signed)
Cardiology Office Note:    Date:  10/14/2023   ID:  Trevor Grimes, DOB Jun 09, 1951, MRN [id number]  PCP:  Gweneth Dimitri, MD  Cardiologist:  Thomasene Ripple, DO  Electrophysiologist:  None   Referring MD: Murlean Iba, NP   Chief Complaint  Patient presents with   Coronary Artery Disease    History of Present Illness:    Trevor Grimes is a 72 y.o. male with a hx of T2DM, OSA, hypothyroidism who is referred by Dr Corliss Blacker for evaluation of CAD.  Calcium score on 08/09/23 was 1115 (84th percentile).  Lexiscan myoview 02/2020 showed normal perfusion, EF 54%.  He reports he has been having occasional chest pain, occurs about once per month.  Describes as burning in center of chest.  No clear relationship with exertion.  Does report sometimes has dyspnea on exertion.  He reports some lightheadedness but denies any syncope.  Reports some swelling in his right leg.  He lost 60 pounds on Ozempic over the last 1.5 years.  No smoking history.  Family history includes father had CVA in 47s.  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      Past Medical History:  Diagnosis Date   Anxiety    Arthritis    Chronic back pain    Depression    Diabetes mellitus without complication (HCC)    History of kidney stones    Hypothyroidism    Sleep apnea    Has CPAP machine, does not wear every night   Thyroid disease     Past Surgical History:  Procedure Laterality Date   AMPUTATION Right 01/25/2013   Procedure: RIGHT 1ST RAY AMPUTATION ;  Surgeon: Toni Arthurs, MD;  Location: [facility] OR;  Service: Orthopedics;  Laterality: Right;   BACK SURGERY     BICEPT TENODESIS Right 06/18/2021   Procedure: BICEPS TENODESIS;  Surgeon: Bjorn Pippin, MD;  Location: [facility];  Service: Orthopedics;  Laterality: Right;   CERVICAL FUSION     CHEILECTOMY Left 05/06/2016   Procedure: LEFT HALLUX METATARSAL PHALANGEAL JOINT CHEILECTOMY AND CARTIVA  RESURFACING;  Surgeon: Toni Arthurs, MD;   Location: [facility];  Service: Orthopedics;  Laterality: Left;   I & D EXTREMITY Right 01/25/2013   Procedure: IRRIGATION AND DEBRIDEMENT Right Hallux;  Surgeon: Toni Arthurs, MD;  Location: [facility] OR;  Service: Orthopedics;  Laterality: Right;   KNEE SURGERY     SHOULDER ARTHROSCOPY WITH ROTATOR CUFF REPAIR AND SUBACROMIAL DECOMPRESSION Right 06/18/2021   Procedure: SHOULDER ARTHROSCOPY ITH ROTATOR CUFF REPAIR AND SUBACROMIAL DECOMPRESSION;  Surgeon: Bjorn Pippin, MD;  Location: [facility];  Service: Orthopedics;  Laterality: Right;    Current Medications: Current Meds  Medication Sig   ACCU-CHEK SMARTVIEW test strip    atorvastatin (LIPITOR) 20 MG tablet Take 20 mg by mouth daily.    cholecalciferol (VITAMIN D3) 25 MCG (1000 UNIT) tablet Take 1,000 Units by mouth daily.   cyclobenzaprine (FLEXERIL) 10 MG tablet Take 10 mg by mouth 3 (three) times daily as needed for muscle spasms.    gabapentin (NEURONTIN) 100 MG capsule Take 1 capsule (100 mg total) by mouth 3 (three) times daily. For pain   glipiZIDE (GLUCOTROL) 5 MG tablet Take 5 mg by mouth 2 (two) times daily before a meal.    HYDROcodone-acetaminophen (NORCO/VICODIN) 5-325 MG tablet Take 1 tablet by mouth every 6 (six) hours as needed for moderate pain (pain score 4-6).   levothyroxine (SYNTHROID)  50 MCG tablet Take 50 mcg by mouth daily before breakfast.    meloxicam (MOBIC) 15 MG tablet Take 15 mg by mouth daily.   metFORMIN (GLUCOPHAGE) 1000 MG tablet Take 1,000 mg by mouth in the morning, at noon, in the evening, and at bedtime.    sertraline (ZOLOFT) 50 MG tablet Take 50 mg by mouth daily.     Allergies:   Patient has no known allergies.   Social History   Socioeconomic History   Marital status: Married    Spouse name: Not on file   Number of children: Not on file   Years of education: Not on file   Highest education level: Not on file  Occupational History   Not on file  Tobacco Use   Smoking  status: Never   Smokeless tobacco: Never  Vaping Use   Vaping status: Never Used  Substance and Sexual Activity   Alcohol use: Yes    Comment: 2-3 weeks, gin and tonic   Drug use: No   Sexual activity: Not on file  Other Topics Concern   Not on file  Social History Narrative   Not on file   Social Determinants of Health   Financial Resource Strain: Not on file  Food Insecurity: Not on file  Transportation Needs: Not on file  Physical Activity: Not on file  Stress: Not on file  Social Connections: Not on file     Family History: The patient's family history includes Diabetes in his father; Liver disease in his mother.  ROS:   Please see the history of present illness.     All other systems reviewed and are negative.  EKGs/Labs/Other Studies Reviewed:    The following studies were reviewed today:   EKG:   10/14/2023: Normal sinus rhythm, rate 66, no ST abnormalities  Recent Labs: No results found for requested labs within last 365 days.  Recent Lipid Panel No results found for: "CHOL", "TRIG", "HDL", "CHOLHDL", "VLDL", "LDLCALC", "LDLDIRECT"  Physical Exam:    VS:  BP 130/70   Pulse 66   Ht 5\' 9"  (1.753 m)   Wt 171 lb (77.6 kg)   SpO2 100%   BMI 25.25 kg/m     Wt Readings from Last 3 Encounters:  10/14/23 171 lb (77.6 kg)  06/18/21 202 lb 9.6 oz (91.9 kg)  10/31/20 200 lb (90.7 kg)     GEN:  Well nourished, well developed in no acute distress HEENT: Normal NECK: No JVD; No carotid bruits LYMPHATICS: No lymphadenopathy CARDIAC: RRR, no murmurs, rubs, gallops RESPIRATORY:  Clear to auscultation without rales, wheezing or rhonchi  ABDOMEN: Soft, non-tender, non-distended MUSCULOSKELETAL:  trace RLE edema; No deformity  SKIN: Warm and dry NEUROLOGIC:  Alert and oriented x 3 PSYCHIATRIC:  Normal affect   ASSESSMENT:    1. Precordial pain   2. Elevated coronary artery calcium score   3. Hyperlipidemia, unspecified hyperlipidemia type    PLAN:     CAD: Calcium score on 08/09/23 was 1115 (84th percentile).  He is reporting atypical chest pain but also reports dyspnea on exertion that could represent anginal equivalent.  Does have CAD risk factors (age, T2DM).  Recommend stress PET to evaluate for ischemia.  Check echocardiogram to rule out structural heart disease  Hyperlipidemia: On atorvastatin 20 mg daily.  LDL 51 on 12/30/2022  T2DM: on metformin, glipizide.  A1c 6.9% on 09/20/2023  RTC in 6 months  Informed Consent   Shared Decision Making/Informed Consent The risks [chest pain, shortness  of breath, cardiac arrhythmias, dizziness, blood pressure fluctuations, myocardial infarction, stroke/transient ischemic attack, nausea, vomiting, allergic reaction, radiation exposure, metallic taste sensation and life-threatening complications (estimated to be 1 in 10,000)], benefits (risk stratification, diagnosing coronary artery disease, treatment guidance) and alternatives of a cardiac PET stress test were discussed in detail with Mr. Hauptmann and he agrees to proceed.      Medication Adjustments/Labs and Tests Ordered: Current medicines are reviewed at length with the patient today.  Concerns regarding medicines are outlined above.  Orders Placed This Encounter  Procedures   NM PET CT CARDIAC PERFUSION MULTI W/ABSOLUTE BLOODFLOW   Cardiac Stress Test: Informed Consent Details: Physician/Practitioner Attestation; Transcribe to consent form and obtain patient signature   EKG 12-Lead   ECHOCARDIOGRAM COMPLETE   No orders of the defined types were placed in this encounter.   Patient Instructions  Medication Instructions:  Continue current medications *If you need a refill on your cardiac medications before your next appointment, please call your pharmacy*   Lab Work: NONE If you have labs (blood work) drawn today and your tests are completely normal, you will receive your results only by: MyChart Message (if you have MyChart) OR A  paper copy in the mail If you have any lab test that is abnormal or we need to change your treatment, we will call you to review the results.   Testing/Procedures: ECHO  Your physician has requested that you have an echocardiogram. Echocardiography is a painless test that uses sound waves to create images of your heart. It provides your doctor with information about the size and shape of your heart and how well your heart's chambers and valves are working. This procedure takes approximately one hour. There are no restrictions for this procedure. Please do NOT wear cologne, perfume, aftershave, or lotions (deodorant is allowed). Please arrive 15 minutes prior to your appointment time.  Please note: We ask at that you not bring children with you during ultrasound (echo/ vascular) testing. Due to room size and safety concerns, children are not allowed in the ultrasound rooms during exams. Our front office staff cannot provide observation of children in our lobby area while testing is being conducted. An adult accompanying a patient to their appointment will only be allowed in the ultrasound room at the discretion of the ultrasound technician under special circumstances. We apologize for any inconvenience.    Follow-Up: At [facility], you and your health needs are our priority.  As part of our continuing mission to provide you with exceptional heart care, we have created designated Provider Care Teams.  These Care Teams include your primary Cardiologist (physician) and Advanced Practice Providers (APPs -  Physician Assistants and Nurse Practitioners) who all work together to provide you with the care you need, when you need it.  We recommend signing up for the patient portal called "MyChart".  Sign up information is provided on this After Visit Summary.  MyChart is used to connect with patients for Virtual Visits (Telemedicine).  Patients are able to view lab/test results, encounter notes,  upcoming appointments, etc.  Non-urgent messages can be sent to your provider as well.   To learn more about what you can do with MyChart, go to [email or web address].    Your next appointment:   6 month(s)  Provider:   Dr.Mava Suares  Other Instructions How to Prepare for Your Cardiac PET/CT Stress Test:  1. Please do not take these medications before your test:   Medications that may interfere with the  cardiac pharmacological stress agent (ex. nitrates - including erectile dysfunction medications, isosorbide mononitrate- [please start to hold this medication the day before the test], tamulosin or beta-blockers) the day of the exam. (Erectile dysfunction medication should be held for at least 72 hrs prior to test) Your remaining medications may be taken with water.  2. Nothing to eat or drink, except water, 3 hours prior to arrival time.   NO caffeine/decaffeinated products, or chocolate 12 hours prior to arrival.  3. NO perfume, cologne or lotion on chest or abdomen area.         4. Total time is 1 to 2 hours; you may want to bring reading material for the waiting time.  5. Please report to Radiology at the [facility] Main Entrance 30 minutes early for your test.  [address] [place], [place] [address]   Diabetic Preparation:  Hold oral medications. You may take NPH and Lantus insulin. Do not take Humalog or Humulin R (Regular Insulin) the day of your test. Check blood sugars prior to leaving the house. If able to eat breakfast prior to 3 hour fasting, you may take all medications, including your insulin, Do not worry if you miss your breakfast dose of insulin - start at your next meal. Patients who wear a continuous glucose monitor MUST remove the device prior to scanning.   In preparation for your appointment, medication and supplies will be purchased.  Appointment availability is limited, so if you need to cancel or reschedule, please call the Radiology  Department at [phone] [facility])  24 hours in advance to avoid a cancellation fee of $100.00  What to Expect After you Arrive:  Once you arrive and check in for your appointment, you will be taken to a preparation room within the Radiology Department.  A technologist or Nurse will obtain your medical history, verify that you are correctly prepped for the exam, and explain the procedure.  Afterwards,  an IV will be started in your arm and electrodes will be placed on your skin for EKG monitoring during the stress portion of the exam. Then you will be escorted to the PET/CT scanner.  There, staff will get you positioned on the scanner and obtain a blood pressure and EKG.  During the exam, you will continue to be connected to the EKG and blood pressure machines.  A small, safe amount of a radioactive tracer will be injected in your IV to obtain a series of pictures of your heart along with an injection of a stress agent.    After your Exam:  It is recommended that you eat a meal and drink a caffeinated beverage to counter act any effects of the stress agent.  Drink plenty of fluids for the remainder of the day and urinate frequently for the first couple of hours after the exam.  Your doctor will inform you of your test results within 7-10 business days.  For more information and frequently asked questions, please visit our website : [email or web address]  For questions about your test or how to prepare for your test, please call: Cardiac Imaging Nurse Navigators Office: [phone]     Signed, Little Ishikawa, MD  10/14/2023 5:21 PM    [facility]

## 2023-11-04 ENCOUNTER — Ambulatory Visit (HOSPITAL_COMMUNITY)
Admission: RE | Admit: 2023-11-04 | Discharge: 2023-11-04 | Disposition: A | Discharge status: Home / Self Care | Payer: Medicare Other | Source: Ambulatory Visit | Attending: Cardiology | Admitting: Cardiology

## 2023-11-04 DIAGNOSIS — R072 Precordial pain: Secondary | ICD-10-CM

## 2023-11-04 DIAGNOSIS — R931 Abnormal findings on diagnostic imaging of heart and coronary circulation: Secondary | ICD-10-CM | POA: Diagnosis not present

## 2023-11-04 LAB — ECHOCARDIOGRAM COMPLETE
AR max vel: 2.49 cm2
AV Area VTI: 2.31 cm2
AV Area mean vel: 2.55 cm2
AV Mean grad: 3 mm[Hg]
AV Peak grad: 7 mm[Hg]
Ao pk vel: 1.32 m/s
Area-P 1/2: 3.17 cm2
MV M vel: 1.69 m/s
MV Peak grad: 11.4 mm[Hg]
S' Lateral: 3.29 cm

## 2023-11-07 ENCOUNTER — Encounter (HOSPITAL_COMMUNITY): Payer: Self-pay

## 2023-11-09 ENCOUNTER — Encounter (HOSPITAL_COMMUNITY)
Admission: RE | Admit: 2023-11-09 | Discharge: 2023-11-09 | Disposition: A | Discharge status: Home / Self Care | Payer: Medicare Other | Source: Ambulatory Visit | Attending: Cardiology | Admitting: Cardiology

## 2023-11-09 DIAGNOSIS — R072 Precordial pain: Secondary | ICD-10-CM | POA: Insufficient documentation

## 2023-11-09 DIAGNOSIS — M47816 Spondylosis without myelopathy or radiculopathy, lumbar region: Secondary | ICD-10-CM | POA: Diagnosis not present

## 2023-11-09 DIAGNOSIS — R931 Abnormal findings on diagnostic imaging of heart and coronary circulation: Secondary | ICD-10-CM | POA: Insufficient documentation

## 2023-11-09 LAB — NM PET CT CARDIAC PERFUSION MULTI W/ABSOLUTE BLOODFLOW
LV dias vol: 85 mL (ref 62–150)
LV sys vol: 39 mL
MBFR: 2.26
Nuc Rest EF: 54 %
Nuc Stress EF: 57 %
Rest MBF: 0.77 ml/g/min
Rest Nuclear Isotope Dose: 20.4 mCi
ST Depression (mm): 0 mm
Stress MBF: 1.74 ml/g/min
Stress Nuclear Isotope Dose: 20.4 mCi

## 2023-11-09 MED ORDER — RUBIDIUM RB82 GENERATOR (RUBYFILL)
20.0300 | PACK | Freq: Once | INTRAVENOUS | Status: AC
  Administered 2023-11-09: 20.03 via INTRAVENOUS

## 2023-11-09 MED ORDER — REGADENOSON 0.4 MG/5ML IV SOLN
0.4000 mg | Freq: Once | INTRAVENOUS | Status: AC
  Administered 2023-11-09: 0.4 mg via INTRAVENOUS

## 2023-11-09 MED ORDER — RUBIDIUM RB82 GENERATOR (RUBYFILL)
20.0400 | PACK | Freq: Once | INTRAVENOUS | Status: AC
  Administered 2023-11-09: 20.04 via INTRAVENOUS

## 2023-11-09 MED ORDER — REGADENOSON 0.4 MG/5ML IV SOLN
INTRAVENOUS | Status: AC
Start: 2023-11-09 — End: ?
  Filled 2023-11-09: qty 5

## 2023-11-10 ENCOUNTER — Telehealth: Payer: Self-pay | Admitting: Cardiology

## 2023-11-10 DIAGNOSIS — M47816 Spondylosis without myelopathy or radiculopathy, lumbar region: Secondary | ICD-10-CM | POA: Diagnosis not present

## 2023-11-10 NOTE — Telephone Encounter (Signed)
Called and spoke to patient. Verified name and DOB. Below message relayed to patient. Patient has concerns and would like explanation ot these findings: Please advise.  MPRESSION: 1. Diffuse bilateral bronchial wall thickening, consistent with nonspecific infectious or inflammatory bronchitis. 2. Coronary artery disease.    Little Ishikawa, MD 11/10/2023  6:21 AM EST     Normal stress test

## 2023-11-10 NOTE — Telephone Encounter (Signed)
Patient is requesting call back to discuss results of CT. Please advise.

## 2023-11-11 ENCOUNTER — Encounter: Payer: Self-pay | Admitting: Emergency Medicine

## 2023-11-11 NOTE — Telephone Encounter (Signed)
The stress test portion of the study was normal.  As part of the stress test we will also get a CT scan of the chest, which suggested findings consistent with bronchitis.  Has he been having any cough or shortness of breath?  Would recommend following up with PCP for treatment if having any symptoms

## 2023-11-11 NOTE — Telephone Encounter (Signed)
Spoke with patient, discussed results.  Recommend adding aspirin 81 mg daily, otherwise no changes

## 2023-11-14 MED ORDER — ASPIRIN 81 MG PO TBEC
81.0000 mg | DELAYED_RELEASE_TABLET | Freq: Every day | ORAL | Status: AC
Start: 2023-11-14 — End: ?

## 2023-11-14 NOTE — Telephone Encounter (Signed)
Called and spoke to patient, patient reports he longer has SOB or coughing. Patient verbalizes he would like to hold off with CT scan of chest.  Writer made patient aware that Dr. Bjorn Pippin would be made aware that patient would like to hold off on ct scan of chest.  Made patient aware to follow up with PCP for treatment if having any symptoms   or call office. Patient unsure if he should be taking aspirin. Per Dr. Bjorn Pippin Aspirin 81 mg daily. Order initiated and sent to patient's pharm of choice. Patient voiced understanding.

## 2023-11-15 NOTE — Telephone Encounter (Signed)
To clarify the stress PETs include a CT as part of the stress test, so he already had a CT which was where the lung findings were seen

## 2023-11-21 DIAGNOSIS — M545 Low back pain, unspecified: Secondary | ICD-10-CM | POA: Diagnosis not present

## 2023-11-21 DIAGNOSIS — Z6825 Body mass index (BMI) 25.0-25.9, adult: Secondary | ICD-10-CM | POA: Diagnosis not present

## 2023-11-21 DIAGNOSIS — Z1331 Encounter for screening for depression: Secondary | ICD-10-CM | POA: Diagnosis not present

## 2023-11-21 DIAGNOSIS — Z1211 Encounter for screening for malignant neoplasm of colon: Secondary | ICD-10-CM | POA: Diagnosis not present

## 2023-11-21 DIAGNOSIS — R531 Weakness: Secondary | ICD-10-CM | POA: Diagnosis not present

## 2023-11-21 DIAGNOSIS — Z0000 Encounter for general adult medical examination without abnormal findings: Secondary | ICD-10-CM | POA: Diagnosis not present

## 2023-11-21 NOTE — Telephone Encounter (Signed)
Called and spoke to patient and patient voiced an understanding that the PET scan that was completed included a CT. Patient verbalized he will picking up Aspirin 81 mg today as ordered  and verbalized  an understanding and no other questions or concerns.

## 2023-11-24 DIAGNOSIS — Z1211 Encounter for screening for malignant neoplasm of colon: Secondary | ICD-10-CM | POA: Diagnosis not present

## 2023-11-28 DIAGNOSIS — M545 Low back pain, unspecified: Secondary | ICD-10-CM | POA: Diagnosis not present

## 2023-11-28 DIAGNOSIS — R531 Weakness: Secondary | ICD-10-CM | POA: Diagnosis not present

## 2023-11-30 DIAGNOSIS — M545 Low back pain, unspecified: Secondary | ICD-10-CM | POA: Diagnosis not present

## 2023-11-30 DIAGNOSIS — R531 Weakness: Secondary | ICD-10-CM | POA: Diagnosis not present

## 2023-12-05 DIAGNOSIS — R531 Weakness: Secondary | ICD-10-CM | POA: Diagnosis not present

## 2023-12-05 DIAGNOSIS — M545 Low back pain, unspecified: Secondary | ICD-10-CM | POA: Diagnosis not present

## 2023-12-06 DIAGNOSIS — M47816 Spondylosis without myelopathy or radiculopathy, lumbar region: Secondary | ICD-10-CM | POA: Diagnosis not present

## 2023-12-07 DIAGNOSIS — M47816 Spondylosis without myelopathy or radiculopathy, lumbar region: Secondary | ICD-10-CM | POA: Diagnosis not present

## 2023-12-07 DIAGNOSIS — M5136 Other intervertebral disc degeneration, lumbar region with discogenic back pain only: Secondary | ICD-10-CM | POA: Diagnosis not present

## 2023-12-07 DIAGNOSIS — M545 Low back pain, unspecified: Secondary | ICD-10-CM | POA: Diagnosis not present

## 2023-12-07 DIAGNOSIS — R531 Weakness: Secondary | ICD-10-CM | POA: Diagnosis not present

## 2023-12-07 DIAGNOSIS — M48061 Spinal stenosis, lumbar region without neurogenic claudication: Secondary | ICD-10-CM | POA: Diagnosis not present

## 2023-12-07 DIAGNOSIS — M4186 Other forms of scoliosis, lumbar region: Secondary | ICD-10-CM | POA: Diagnosis not present

## 2023-12-12 DIAGNOSIS — M545 Low back pain, unspecified: Secondary | ICD-10-CM | POA: Diagnosis not present

## 2023-12-12 DIAGNOSIS — R531 Weakness: Secondary | ICD-10-CM | POA: Diagnosis not present

## 2023-12-14 DIAGNOSIS — M545 Low back pain, unspecified: Secondary | ICD-10-CM | POA: Diagnosis not present

## 2023-12-14 DIAGNOSIS — R531 Weakness: Secondary | ICD-10-CM | POA: Diagnosis not present

## 2023-12-19 DIAGNOSIS — M545 Low back pain, unspecified: Secondary | ICD-10-CM | POA: Diagnosis not present

## 2023-12-19 DIAGNOSIS — R531 Weakness: Secondary | ICD-10-CM | POA: Diagnosis not present

## 2023-12-28 DIAGNOSIS — M47816 Spondylosis without myelopathy or radiculopathy, lumbar region: Secondary | ICD-10-CM | POA: Diagnosis not present

## 2024-01-24 DIAGNOSIS — E039 Hypothyroidism, unspecified: Secondary | ICD-10-CM | POA: Diagnosis not present

## 2024-01-24 DIAGNOSIS — E1165 Type 2 diabetes mellitus with hyperglycemia: Secondary | ICD-10-CM | POA: Diagnosis not present

## 2024-01-31 DIAGNOSIS — E1165 Type 2 diabetes mellitus with hyperglycemia: Secondary | ICD-10-CM | POA: Diagnosis not present

## 2024-01-31 DIAGNOSIS — G5793 Unspecified mononeuropathy of bilateral lower limbs: Secondary | ICD-10-CM | POA: Diagnosis not present

## 2024-01-31 DIAGNOSIS — E785 Hyperlipidemia, unspecified: Secondary | ICD-10-CM | POA: Diagnosis not present

## 2024-01-31 DIAGNOSIS — E039 Hypothyroidism, unspecified: Secondary | ICD-10-CM | POA: Diagnosis not present

## 2024-02-07 DIAGNOSIS — E1142 Type 2 diabetes mellitus with diabetic polyneuropathy: Secondary | ICD-10-CM | POA: Diagnosis not present

## 2024-02-07 DIAGNOSIS — Z6825 Body mass index (BMI) 25.0-25.9, adult: Secondary | ICD-10-CM | POA: Diagnosis not present

## 2024-02-08 DIAGNOSIS — N529 Male erectile dysfunction, unspecified: Secondary | ICD-10-CM | POA: Diagnosis not present

## 2024-02-08 DIAGNOSIS — E1165 Type 2 diabetes mellitus with hyperglycemia: Secondary | ICD-10-CM | POA: Diagnosis not present

## 2024-03-12 DIAGNOSIS — M47816 Spondylosis without myelopathy or radiculopathy, lumbar region: Secondary | ICD-10-CM | POA: Diagnosis not present

## 2024-03-14 DIAGNOSIS — N529 Male erectile dysfunction, unspecified: Secondary | ICD-10-CM | POA: Diagnosis not present

## 2024-04-11 DIAGNOSIS — D7589 Other specified diseases of blood and blood-forming organs: Secondary | ICD-10-CM | POA: Diagnosis not present

## 2024-04-11 DIAGNOSIS — E039 Hypothyroidism, unspecified: Secondary | ICD-10-CM | POA: Diagnosis not present

## 2024-04-11 DIAGNOSIS — E785 Hyperlipidemia, unspecified: Secondary | ICD-10-CM | POA: Diagnosis not present

## 2024-04-11 DIAGNOSIS — Z79899 Other long term (current) drug therapy: Secondary | ICD-10-CM | POA: Diagnosis not present

## 2024-04-18 ENCOUNTER — Ambulatory Visit: Payer: Medicare Other | Admitting: Cardiology

## 2024-04-18 DIAGNOSIS — E1169 Type 2 diabetes mellitus with other specified complication: Secondary | ICD-10-CM | POA: Diagnosis not present

## 2024-04-18 DIAGNOSIS — E039 Hypothyroidism, unspecified: Secondary | ICD-10-CM | POA: Diagnosis not present

## 2024-04-18 DIAGNOSIS — E785 Hyperlipidemia, unspecified: Secondary | ICD-10-CM | POA: Diagnosis not present

## 2024-04-18 DIAGNOSIS — Z6825 Body mass index (BMI) 25.0-25.9, adult: Secondary | ICD-10-CM | POA: Diagnosis not present

## 2024-04-18 DIAGNOSIS — G8929 Other chronic pain: Secondary | ICD-10-CM | POA: Diagnosis not present

## 2024-04-18 DIAGNOSIS — E1142 Type 2 diabetes mellitus with diabetic polyneuropathy: Secondary | ICD-10-CM | POA: Diagnosis not present

## 2024-04-18 DIAGNOSIS — Z79899 Other long term (current) drug therapy: Secondary | ICD-10-CM | POA: Diagnosis not present

## 2024-04-18 DIAGNOSIS — D7589 Other specified diseases of blood and blood-forming organs: Secondary | ICD-10-CM | POA: Diagnosis not present

## 2024-04-18 DIAGNOSIS — F3342 Major depressive disorder, recurrent, in full remission: Secondary | ICD-10-CM | POA: Diagnosis not present

## 2024-04-18 DIAGNOSIS — G479 Sleep disorder, unspecified: Secondary | ICD-10-CM | POA: Diagnosis not present

## 2024-04-18 DIAGNOSIS — M8588 Other specified disorders of bone density and structure, other site: Secondary | ICD-10-CM | POA: Diagnosis not present

## 2024-04-18 DIAGNOSIS — Z8639 Personal history of other endocrine, nutritional and metabolic disease: Secondary | ICD-10-CM | POA: Diagnosis not present

## 2024-04-25 DIAGNOSIS — M47816 Spondylosis without myelopathy or radiculopathy, lumbar region: Secondary | ICD-10-CM | POA: Diagnosis not present

## 2024-05-01 DIAGNOSIS — I739 Peripheral vascular disease, unspecified: Secondary | ICD-10-CM | POA: Diagnosis not present

## 2024-05-01 DIAGNOSIS — E1142 Type 2 diabetes mellitus with diabetic polyneuropathy: Secondary | ICD-10-CM | POA: Diagnosis not present

## 2024-05-01 DIAGNOSIS — B351 Tinea unguium: Secondary | ICD-10-CM | POA: Diagnosis not present

## 2024-05-01 DIAGNOSIS — M21961 Unspecified acquired deformity of right lower leg: Secondary | ICD-10-CM | POA: Diagnosis not present

## 2024-05-01 DIAGNOSIS — Z89431 Acquired absence of right foot: Secondary | ICD-10-CM | POA: Diagnosis not present

## 2024-05-09 DIAGNOSIS — E119 Type 2 diabetes mellitus without complications: Secondary | ICD-10-CM | POA: Diagnosis not present

## 2024-05-09 DIAGNOSIS — R030 Elevated blood-pressure reading, without diagnosis of hypertension: Secondary | ICD-10-CM | POA: Diagnosis not present

## 2024-05-09 DIAGNOSIS — Z6826 Body mass index (BMI) 26.0-26.9, adult: Secondary | ICD-10-CM | POA: Diagnosis not present

## 2024-05-28 DIAGNOSIS — E039 Hypothyroidism, unspecified: Secondary | ICD-10-CM | POA: Diagnosis not present

## 2024-05-28 DIAGNOSIS — E119 Type 2 diabetes mellitus without complications: Secondary | ICD-10-CM | POA: Diagnosis not present

## 2024-06-05 DIAGNOSIS — E785 Hyperlipidemia, unspecified: Secondary | ICD-10-CM | POA: Diagnosis not present

## 2024-06-05 DIAGNOSIS — E039 Hypothyroidism, unspecified: Secondary | ICD-10-CM | POA: Diagnosis not present

## 2024-06-05 DIAGNOSIS — E1165 Type 2 diabetes mellitus with hyperglycemia: Secondary | ICD-10-CM | POA: Diagnosis not present

## 2024-06-05 DIAGNOSIS — N529 Male erectile dysfunction, unspecified: Secondary | ICD-10-CM | POA: Diagnosis not present

## 2024-07-10 DIAGNOSIS — M47816 Spondylosis without myelopathy or radiculopathy, lumbar region: Secondary | ICD-10-CM | POA: Diagnosis not present

## 2024-07-22 NOTE — Progress Notes (Unsigned)
 Cardiology Office Note:    Date:  07/24/2024   ID:  Trevor Grimes, DOB September 13, 1951, MRN [id number]  PCP:  Aisha Harvey, MD  Cardiologist:  Dub Huntsman, DO  Electrophysiologist:  None   Referring MD: Aisha Harvey, MD   Chief Complaint  Patient presents with   Coronary Artery Disease    History of Present Illness:    Trevor Grimes is a 73 y.o. male with a hx of T2DM, OSA, hypothyroidism who presents for follow-up.  He was referred by Dr Aisha for evaluation of CAD, initially seen 10/14/2023.  Calcium score on 08/09/23 was 1115 (84th percentile).  Lexiscan  myoview  02/2020 showed normal perfusion, EF 54%.  Echocardiogram 10/2023 showed EF 50 to 55%, normal RV function, no significant valvular disease.  Stress PET 11/09/2023 showed normal perfusion, LVEF 54%, normal myocardial blood flow reserve.  Since last clinic visit, he reports he is doing okay.  Denies any chest pain, dyspnea, lower extremity edema, or palpitations.  Reports some tingling in his chest, can happen at rest or with exertion.  He goes to gym 3 days/week for an hour, does dance class.  Does not feel that exertion causes any symptoms.  He reports some lightheadedness with denies any syncope.  Reports swelling in the right leg.   Past Medical History:  Diagnosis Date   Anxiety    Arthritis    Chronic back pain    Depression    Diabetes mellitus without complication (HCC)    History of kidney stones    Hypothyroidism    Sleep apnea    Has CPAP machine, does not wear every night   Thyroid  disease     Past Surgical History:  Procedure Laterality Date   AMPUTATION Right 01/25/2013   Procedure: RIGHT 1ST RAY AMPUTATION ;  Surgeon: Norleen Armor, MD;  Location: [facility] OR;  Service: Orthopedics;  Laterality: Right;   BACK SURGERY     BICEPT TENODESIS Right 06/18/2021   Procedure: BICEPS TENODESIS;  Surgeon: Cristy Bonner DASEN, MD;  Location: [facility];  Service: Orthopedics;  Laterality: Right;   CERVICAL  FUSION     CHEILECTOMY Left 05/06/2016   Procedure: LEFT HALLUX METATARSAL PHALANGEAL JOINT CHEILECTOMY AND CARTIVA  RESURFACING;  Surgeon: Norleen Armor, MD;  Location: [facility];  Service: Orthopedics;  Laterality: Left;   I & D EXTREMITY Right 01/25/2013   Procedure: IRRIGATION AND DEBRIDEMENT Right Hallux;  Surgeon: Norleen Armor, MD;  Location: [facility];  Service: Orthopedics;  Laterality: Right;   KNEE SURGERY     SHOULDER ARTHROSCOPY WITH ROTATOR CUFF REPAIR AND SUBACROMIAL DECOMPRESSION Right 06/18/2021   Procedure: SHOULDER ARTHROSCOPY ITH ROTATOR CUFF REPAIR AND SUBACROMIAL DECOMPRESSION;  Surgeon: Cristy Bonner DASEN, MD;  Location: [facility];  Service: Orthopedics;  Laterality: Right;    Current Medications: Current Meds  Medication Sig   ACCU-CHEK SMARTVIEW test strip    atorvastatin (LIPITOR) 20 MG tablet Take 20 mg by mouth daily.    cholecalciferol (VITAMIN D3) 25 MCG (1000 UNIT) tablet Take 1,000 Units by mouth daily.   cyclobenzaprine (FLEXERIL) 10 MG tablet Take 10 mg by mouth 3 (three) times daily as needed for muscle spasms.    HYDROcodone -acetaminophen  (NORCO/VICODIN) 5-325 MG tablet Take 1 tablet by mouth every 6 (six) hours as needed for moderate pain (pain score 4-6).   levothyroxine  (SYNTHROID ) 50 MCG tablet Take 50 mcg by mouth daily before breakfast.    meloxicam (MOBIC) 15 MG tablet Take 15 mg by  mouth daily.   sertraline  (ZOLOFT ) 50 MG tablet Take 50 mg by mouth daily.     Allergies:   Patient has no known allergies.   Social History   Socioeconomic History   Marital status: Married    Spouse name: Not on file   Number of children: Not on file   Years of education: Not on file   Highest education level: Not on file  Occupational History   Not on file  Tobacco Use   Smoking status: Never   Smokeless tobacco: Never  Vaping Use   Vaping status: Never Used  Substance and Sexual Activity   Alcohol use: Yes    Comment: 2-3 weeks, gin  and tonic   Drug use: No   Sexual activity: Not on file  Other Topics Concern   Not on file  Social History Narrative   Not on file   Social Drivers of Health   Financial Resource Strain: Not on file  Food Insecurity: Not on file  Transportation Needs: Not on file  Physical Activity: Not on file  Stress: Not on file  Social Connections: Not on file     Family History: The patient's family history includes Diabetes in his father; Liver disease in his mother.  ROS:   Please see the history of present illness.     All other systems reviewed and are negative.  EKGs/Labs/Other Studies Reviewed:    The following studies were reviewed today:   EKG:   10/14/2023: Normal sinus rhythm, rate 66, no ST abnormalities 9-25: Normal sinus rhythm, rate 63, no ST abnormalities  Recent Labs: No results found for requested labs within last 365 days.  Recent Lipid Panel No results found for: CHOL, TRIG, HDL, CHOLHDL, VLDL, LDLCALC, LDLDIRECT  Physical Exam:    VS:  BP 126/76 (BP Location: Right Arm, Patient Position: Sitting, Cuff Size: Normal)   Pulse 63   Ht 5' 8 (1.727 m)   Wt 170 lb (77.1 kg)   SpO2 95%   BMI 25.85 kg/m     Wt Readings from Last 3 Encounters:  07/24/24 170 lb (77.1 kg)  10/14/23 171 lb (77.6 kg)  06/18/21 202 lb 9.6 oz (91.9 kg)     GEN:  Well nourished, well developed in no acute distress HEENT: Normal NECK: No JVD; No carotid bruits LYMPHATICS: No lymphadenopathy CARDIAC: RRR, no murmurs, rubs, gallops RESPIRATORY:  Clear to auscultation without rales, wheezing or rhonchi  ABDOMEN: Soft, non-tender, non-distended MUSCULOSKELETAL:  trace RLE edema; No deformity  SKIN: Warm and dry NEUROLOGIC:  Alert and oriented x 3 PSYCHIATRIC:  Normal affect   ASSESSMENT:    1. Swelling of right lower extremity   2. Elevated coronary artery calcium score   3. Hyperlipidemia, unspecified hyperlipidemia type     PLAN:    CAD: Calcium score on  08/09/23 was 1115 (84th percentile).  He is reporting atypical chest pain.  Echocardiogram 10/2023 showed EF 50 to 55%, normal RV function, no significant valvular disease.  Stress PET 11/09/2023 showed normal perfusion, LVEF 54%, normal myocardial blood flow reserve.  Right lower extremity edema: Asymmetric right lower extremity edema, will check duplex to rule out DVT  Hyperlipidemia: On atorvastatin 20 mg daily.  LDL 69 on 04/11/24  T2DM: on metformin , glipizide.  A1c 6.8% on 05/28/24  RTC in 1 year  Medication Adjustments/Labs and Tests Ordered: Current medicines are reviewed at length with the patient today.  Concerns regarding medicines are outlined above.  Orders Placed This Encounter  Procedures   EKG 12-Lead   VAS US  LOWER EXTREMITY VENOUS (DVT)   No orders of the defined types were placed in this encounter.   Patient Instructions  Medication Instructions:  Continue current medications *If you need a refill on your cardiac medications before your next appointment, please call your pharmacy*  Lab Work: none If you have labs (blood work) drawn today and your tests are completely normal, you will receive your results only by: MyChart Message (if you have MyChart) OR A paper copy in the mail If you have any lab test that is abnormal or we need to change your treatment, we will call you to review the results.  Testing/Procedures: Your physician has requested that you have a lower or upper extremity venous duplex. This test is an ultrasound of the veins in the legs or arms. It looks at venous blood flow that carries blood from the heart to the legs or arms. Allow one hour for a Lower Venous exam. Allow thirty minutes for an Upper Venous exam. There are no restrictions or special instructions.  Please note: We ask at that you not bring children with you during ultrasound (echo/ vascular) testing. Due to room size and safety concerns, children are not allowed in the ultrasound rooms  during exams. Our front office staff cannot provide observation of children in our lobby area while testing is being conducted. An adult accompanying a patient to their appointment will only be allowed in the ultrasound room at the discretion of the ultrasound technician under special circumstances. We apologize for any inconvenience.   Follow-Up: At [facility], you and your health needs are our priority.  As part of our continuing mission to provide you with exceptional heart care, our providers are all part of one team.  This team includes your primary Cardiologist (physician) and Advanced Practice Providers or APPs (Physician Assistants and Nurse Practitioners) who all work together to provide you with the care you need, when you need it.  Your next appointment:   1 year  Provider:   Dr. Kate  We recommend signing up for the patient portal called MyChart.  Sign up information is provided on this After Visit Summary.  MyChart is used to connect with patients for Virtual Visits (Telemedicine).  Patients are able to view lab/test results, encounter notes, upcoming appointments, etc.  Non-urgent messages can be sent to your provider as well.   To learn more about what you can do with MyChart, go to [email or web address].   Other Instructions none       Signed, Lonni LITTIE Kate, MD  07/24/2024 12:35 PM    [facility]

## 2024-07-24 ENCOUNTER — Encounter: Payer: Self-pay | Admitting: Cardiology

## 2024-07-24 ENCOUNTER — Ambulatory Visit: Attending: Cardiology | Admitting: Cardiology

## 2024-07-24 VITALS — BP 126/76 | HR 63 | Ht 68.0 in | Wt 170.0 lb

## 2024-07-24 DIAGNOSIS — E785 Hyperlipidemia, unspecified: Secondary | ICD-10-CM | POA: Insufficient documentation

## 2024-07-24 DIAGNOSIS — R931 Abnormal findings on diagnostic imaging of heart and coronary circulation: Secondary | ICD-10-CM | POA: Diagnosis not present

## 2024-07-24 DIAGNOSIS — M7989 Other specified soft tissue disorders: Secondary | ICD-10-CM | POA: Diagnosis not present

## 2024-07-24 NOTE — Patient Instructions (Addendum)
 Medication Instructions:  Continue current medications *If you need a refill on your cardiac medications before your next appointment, please call your pharmacy*  Lab Work: none If you have labs (blood work) drawn today and your tests are completely normal, you will receive your results only by: MyChart Message (if you have MyChart) OR A paper copy in the mail If you have any lab test that is abnormal or we need to change your treatment, we will call you to review the results.  Testing/Procedures: Your physician has requested that you have a lower or upper extremity venous duplex. This test is an ultrasound of the veins in the legs or arms. It looks at venous blood flow that carries blood from the heart to the legs or arms. Allow one hour for a Lower Venous exam. Allow thirty minutes for an Upper Venous exam. There are no restrictions or special instructions.  Please note: We ask at that you not bring children with you during ultrasound (echo/ vascular) testing. Due to room size and safety concerns, children are not allowed in the ultrasound rooms during exams. Our front office staff cannot provide observation of children in our lobby area while testing is being conducted. An adult accompanying a patient to their appointment will only be allowed in the ultrasound room at the discretion of the ultrasound technician under special circumstances. We apologize for any inconvenience.   Follow-Up: At [facility], you and your health needs are our priority.  As part of our continuing mission to provide you with exceptional heart care, our providers are all part of one team.  This team includes your primary Cardiologist (physician) and Advanced Practice Providers or APPs (Physician Assistants and Nurse Practitioners) who all work together to provide you with the care you need, when you need it.  Your next appointment:   1 year  Provider:   Dr. Kate  We recommend signing up for the  patient portal called MyChart.  Sign up information is provided on this After Visit Summary.  MyChart is used to connect with patients for Virtual Visits (Telemedicine).  Patients are able to view lab/test results, encounter notes, upcoming appointments, etc.  Non-urgent messages can be sent to your provider as well.   To learn more about what you can do with MyChart, go to [email or web address].   Other Instructions none

## 2024-07-25 ENCOUNTER — Ambulatory Visit (HOSPITAL_COMMUNITY)
Admission: RE | Admit: 2024-07-25 | Discharge: 2024-07-25 | Disposition: A | Discharge status: Home / Self Care | Source: Ambulatory Visit | Attending: Cardiology | Admitting: Cardiology

## 2024-07-25 DIAGNOSIS — M5414 Radiculopathy, thoracic region: Secondary | ICD-10-CM | POA: Diagnosis not present

## 2024-07-25 DIAGNOSIS — M47815 Spondylosis without myelopathy or radiculopathy, thoracolumbar region: Secondary | ICD-10-CM | POA: Diagnosis not present

## 2024-07-25 DIAGNOSIS — M7989 Other specified soft tissue disorders: Secondary | ICD-10-CM | POA: Insufficient documentation

## 2024-07-25 DIAGNOSIS — M5124 Other intervertebral disc displacement, thoracic region: Secondary | ICD-10-CM | POA: Diagnosis not present

## 2024-07-25 DIAGNOSIS — M5134 Other intervertebral disc degeneration, thoracic region: Secondary | ICD-10-CM | POA: Diagnosis not present

## 2024-07-25 DIAGNOSIS — M47814 Spondylosis without myelopathy or radiculopathy, thoracic region: Secondary | ICD-10-CM | POA: Diagnosis not present

## 2024-07-26 ENCOUNTER — Ambulatory Visit: Payer: Self-pay | Admitting: Cardiology

## 2024-07-31 DIAGNOSIS — D225 Melanocytic nevi of trunk: Secondary | ICD-10-CM | POA: Diagnosis not present

## 2024-07-31 DIAGNOSIS — D034 Melanoma in situ of scalp and neck: Secondary | ICD-10-CM | POA: Diagnosis not present

## 2024-07-31 DIAGNOSIS — L570 Actinic keratosis: Secondary | ICD-10-CM | POA: Diagnosis not present

## 2024-07-31 DIAGNOSIS — D1801 Hemangioma of skin and subcutaneous tissue: Secondary | ICD-10-CM | POA: Diagnosis not present

## 2024-07-31 DIAGNOSIS — Z85828 Personal history of other malignant neoplasm of skin: Secondary | ICD-10-CM | POA: Diagnosis not present

## 2024-07-31 DIAGNOSIS — L814 Other melanin hyperpigmentation: Secondary | ICD-10-CM | POA: Diagnosis not present

## 2024-07-31 DIAGNOSIS — D485 Neoplasm of uncertain behavior of skin: Secondary | ICD-10-CM | POA: Diagnosis not present

## 2024-08-07 DIAGNOSIS — N5201 Erectile dysfunction due to arterial insufficiency: Secondary | ICD-10-CM | POA: Diagnosis not present

## 2024-08-07 DIAGNOSIS — N400 Enlarged prostate without lower urinary tract symptoms: Secondary | ICD-10-CM | POA: Diagnosis not present

## 2024-08-07 DIAGNOSIS — Z125 Encounter for screening for malignant neoplasm of prostate: Secondary | ICD-10-CM | POA: Diagnosis not present

## 2024-08-15 DIAGNOSIS — M545 Low back pain, unspecified: Secondary | ICD-10-CM | POA: Diagnosis not present

## 2024-08-17 DIAGNOSIS — M545 Low back pain, unspecified: Secondary | ICD-10-CM | POA: Diagnosis not present

## 2024-09-07 DIAGNOSIS — D034 Melanoma in situ of scalp and neck: Secondary | ICD-10-CM | POA: Diagnosis not present

## 2024-09-07 DIAGNOSIS — Z85828 Personal history of other malignant neoplasm of skin: Secondary | ICD-10-CM | POA: Diagnosis not present

## 2024-09-11 DIAGNOSIS — M545 Low back pain, unspecified: Secondary | ICD-10-CM | POA: Diagnosis not present

## 2024-09-17 DIAGNOSIS — Z8639 Personal history of other endocrine, nutritional and metabolic disease: Secondary | ICD-10-CM | POA: Diagnosis not present

## 2024-09-17 DIAGNOSIS — Z79899 Other long term (current) drug therapy: Secondary | ICD-10-CM | POA: Diagnosis not present

## 2024-09-17 DIAGNOSIS — E039 Hypothyroidism, unspecified: Secondary | ICD-10-CM | POA: Diagnosis not present

## 2024-09-18 DIAGNOSIS — M545 Low back pain, unspecified: Secondary | ICD-10-CM | POA: Diagnosis not present

## 2024-09-18 DIAGNOSIS — H6590 Unspecified nonsuppurative otitis media, unspecified ear: Secondary | ICD-10-CM | POA: Diagnosis not present

## 2024-09-24 DIAGNOSIS — H6980 Other specified disorders of Eustachian tube, unspecified ear: Secondary | ICD-10-CM | POA: Diagnosis not present

## 2024-09-25 DIAGNOSIS — M545 Low back pain, unspecified: Secondary | ICD-10-CM | POA: Diagnosis not present

## 2024-09-26 DIAGNOSIS — M8588 Other specified disorders of bone density and structure, other site: Secondary | ICD-10-CM | POA: Diagnosis not present

## 2024-09-26 DIAGNOSIS — Z6827 Body mass index (BMI) 27.0-27.9, adult: Secondary | ICD-10-CM | POA: Diagnosis not present

## 2024-09-26 DIAGNOSIS — E1169 Type 2 diabetes mellitus with other specified complication: Secondary | ICD-10-CM | POA: Diagnosis not present

## 2024-09-26 DIAGNOSIS — Z79899 Other long term (current) drug therapy: Secondary | ICD-10-CM | POA: Diagnosis not present

## 2024-09-26 DIAGNOSIS — Z23 Encounter for immunization: Secondary | ICD-10-CM | POA: Diagnosis not present

## 2024-09-26 DIAGNOSIS — M5416 Radiculopathy, lumbar region: Secondary | ICD-10-CM | POA: Diagnosis not present

## 2024-09-26 DIAGNOSIS — M5414 Radiculopathy, thoracic region: Secondary | ICD-10-CM | POA: Diagnosis not present

## 2024-09-26 DIAGNOSIS — F3342 Major depressive disorder, recurrent, in full remission: Secondary | ICD-10-CM | POA: Diagnosis not present

## 2024-09-26 DIAGNOSIS — Z6825 Body mass index (BMI) 25.0-25.9, adult: Secondary | ICD-10-CM | POA: Diagnosis not present

## 2024-09-26 DIAGNOSIS — H6993 Unspecified Eustachian tube disorder, bilateral: Secondary | ICD-10-CM | POA: Diagnosis not present

## 2024-09-26 DIAGNOSIS — G479 Sleep disorder, unspecified: Secondary | ICD-10-CM | POA: Diagnosis not present

## 2024-09-26 DIAGNOSIS — G8929 Other chronic pain: Secondary | ICD-10-CM | POA: Diagnosis not present

## 2024-09-26 DIAGNOSIS — E039 Hypothyroidism, unspecified: Secondary | ICD-10-CM | POA: Diagnosis not present

## 2024-09-26 DIAGNOSIS — E785 Hyperlipidemia, unspecified: Secondary | ICD-10-CM | POA: Diagnosis not present

## 2024-09-26 DIAGNOSIS — Z8639 Personal history of other endocrine, nutritional and metabolic disease: Secondary | ICD-10-CM | POA: Diagnosis not present

## 2024-10-02 DIAGNOSIS — M545 Low back pain, unspecified: Secondary | ICD-10-CM | POA: Diagnosis not present

## 2024-10-08 DIAGNOSIS — E1165 Type 2 diabetes mellitus with hyperglycemia: Secondary | ICD-10-CM | POA: Diagnosis not present

## 2024-10-08 DIAGNOSIS — E039 Hypothyroidism, unspecified: Secondary | ICD-10-CM | POA: Diagnosis not present

## 2024-10-09 DIAGNOSIS — M545 Low back pain, unspecified: Secondary | ICD-10-CM | POA: Diagnosis not present

## 2024-10-15 DIAGNOSIS — E039 Hypothyroidism, unspecified: Secondary | ICD-10-CM | POA: Diagnosis not present

## 2024-10-15 DIAGNOSIS — G5793 Unspecified mononeuropathy of bilateral lower limbs: Secondary | ICD-10-CM | POA: Diagnosis not present

## 2024-10-15 DIAGNOSIS — E785 Hyperlipidemia, unspecified: Secondary | ICD-10-CM | POA: Diagnosis not present

## 2024-10-15 DIAGNOSIS — E1165 Type 2 diabetes mellitus with hyperglycemia: Secondary | ICD-10-CM | POA: Diagnosis not present

## 2024-10-15 DIAGNOSIS — M47816 Spondylosis without myelopathy or radiculopathy, lumbar region: Secondary | ICD-10-CM | POA: Diagnosis not present

## 2024-10-23 DIAGNOSIS — M545 Low back pain, unspecified: Secondary | ICD-10-CM | POA: Diagnosis not present

## 2024-10-29 DIAGNOSIS — H8110 Benign paroxysmal vertigo, unspecified ear: Secondary | ICD-10-CM | POA: Diagnosis not present

## 2024-10-30 DIAGNOSIS — M545 Low back pain, unspecified: Secondary | ICD-10-CM | POA: Diagnosis not present

## 2024-12-19 ENCOUNTER — Telehealth: Payer: Self-pay | Admitting: Cardiology

## 2024-12-19 NOTE — Telephone Encounter (Signed)
 Spoke with pt reports son is having Cardiac issues and would like to know what dx he has.  Advised pt will send to HIM to gather needed information to be sent out and f/u with pt.  Also advised pt can see all testing and MD notes in My Chart.  Pt reports thinks he can access My Chart if has difficulty will call back.  Pt expresses still would like request sent to HIM pool.

## 2024-12-19 NOTE — Telephone Encounter (Signed)
 Patient says his son as been having cardiac issues and he is going to see a specialist. He is requesting copies of all previous testing to give his son for paternal history.
